# Patient Record
Sex: Female | Born: 1935 | Race: White | Hispanic: No | Smoking: Former smoker
Health system: Southern US, Community
[De-identification: ages and names within clinical notes are randomized; demographics above are authoritative.]

## PROBLEM LIST (undated history)

## (undated) DIAGNOSIS — F32A Depression, unspecified: Secondary | ICD-10-CM

## (undated) DIAGNOSIS — K5790 Diverticulosis of intestine, part unspecified, without perforation or abscess without bleeding: Secondary | ICD-10-CM

## (undated) DIAGNOSIS — H9193 Unspecified hearing loss, bilateral: Secondary | ICD-10-CM

## (undated) DIAGNOSIS — F329 Major depressive disorder, single episode, unspecified: Secondary | ICD-10-CM

## (undated) DIAGNOSIS — F419 Anxiety disorder, unspecified: Secondary | ICD-10-CM

## (undated) DIAGNOSIS — M179 Osteoarthritis of knee, unspecified: Secondary | ICD-10-CM

## (undated) DIAGNOSIS — E785 Hyperlipidemia, unspecified: Secondary | ICD-10-CM

## (undated) DIAGNOSIS — M1710 Unilateral primary osteoarthritis, unspecified knee: Secondary | ICD-10-CM

## (undated) DIAGNOSIS — K219 Gastro-esophageal reflux disease without esophagitis: Secondary | ICD-10-CM

## (undated) DIAGNOSIS — Z9621 Cochlear implant status: Secondary | ICD-10-CM

## (undated) DIAGNOSIS — I10 Essential (primary) hypertension: Secondary | ICD-10-CM

## (undated) DIAGNOSIS — A0472 Enterocolitis due to Clostridium difficile, not specified as recurrent: Secondary | ICD-10-CM

## (undated) DIAGNOSIS — G4700 Insomnia, unspecified: Secondary | ICD-10-CM

## (undated) DIAGNOSIS — K635 Polyp of colon: Secondary | ICD-10-CM

## (undated) DIAGNOSIS — K648 Other hemorrhoids: Secondary | ICD-10-CM

## (undated) DIAGNOSIS — Z8489 Family history of other specified conditions: Secondary | ICD-10-CM

## (undated) HISTORY — DX: Unilateral primary osteoarthritis, unspecified knee: M17.10

## (undated) HISTORY — DX: Insomnia, unspecified: G47.00

## (undated) HISTORY — DX: Cochlear implant status: Z96.21

## (undated) HISTORY — DX: Anxiety disorder, unspecified: F41.9

## (undated) HISTORY — DX: Major depressive disorder, single episode, unspecified: F32.9

## (undated) HISTORY — DX: Enterocolitis due to Clostridium difficile, not specified as recurrent: A04.72

## (undated) HISTORY — DX: Osteoarthritis of knee, unspecified: M17.9

## (undated) HISTORY — PX: OTHER SURGICAL HISTORY: SHX169

## (undated) HISTORY — DX: Diverticulosis of intestine, part unspecified, without perforation or abscess without bleeding: K57.90

## (undated) HISTORY — DX: Hyperlipidemia, unspecified: E78.5

## (undated) HISTORY — DX: Unspecified hearing loss, bilateral: H91.93

## (undated) HISTORY — DX: Depression, unspecified: F32.A

## (undated) HISTORY — PX: VAGINAL HYSTERECTOMY: SUR661

## (undated) HISTORY — PX: TONSILLECTOMY: SUR1361

## (undated) HISTORY — PX: BUNIONECTOMY: SHX129

## (undated) HISTORY — PX: ROTATOR CUFF REPAIR: SHX139

## (undated) HISTORY — DX: Other hemorrhoids: K64.8

## (undated) HISTORY — DX: Polyp of colon: K63.5

---

## 1998-12-27 ENCOUNTER — Other Ambulatory Visit: Admission: RE | Admit: 1998-12-27 | Discharge: 1998-12-27 | Payer: Self-pay | Admitting: Obstetrics and Gynecology

## 2001-09-23 ENCOUNTER — Other Ambulatory Visit: Admission: RE | Admit: 2001-09-23 | Discharge: 2001-09-23 | Payer: Self-pay | Admitting: Obstetrics and Gynecology

## 2004-09-30 ENCOUNTER — Ambulatory Visit: Payer: Self-pay | Admitting: Internal Medicine

## 2004-10-03 ENCOUNTER — Ambulatory Visit (HOSPITAL_COMMUNITY): Admission: RE | Admit: 2004-10-03 | Discharge: 2004-10-03 | Payer: Self-pay | Admitting: Internal Medicine

## 2004-11-19 ENCOUNTER — Ambulatory Visit (HOSPITAL_COMMUNITY): Admission: RE | Admit: 2004-11-19 | Discharge: 2004-11-19 | Payer: Self-pay | Admitting: Orthopaedic Surgery

## 2004-11-19 ENCOUNTER — Ambulatory Visit (HOSPITAL_BASED_OUTPATIENT_CLINIC_OR_DEPARTMENT_OTHER): Admission: RE | Admit: 2004-11-19 | Discharge: 2004-11-19 | Payer: Self-pay | Admitting: Orthopaedic Surgery

## 2005-05-23 DIAGNOSIS — A0472 Enterocolitis due to Clostridium difficile, not specified as recurrent: Secondary | ICD-10-CM

## 2005-05-23 HISTORY — DX: Enterocolitis due to Clostridium difficile, not specified as recurrent: A04.72

## 2005-09-29 ENCOUNTER — Ambulatory Visit: Payer: Self-pay | Admitting: Internal Medicine

## 2005-10-02 ENCOUNTER — Ambulatory Visit: Payer: Self-pay | Admitting: Internal Medicine

## 2006-08-20 ENCOUNTER — Ambulatory Visit: Payer: Self-pay | Admitting: Internal Medicine

## 2006-08-28 ENCOUNTER — Ambulatory Visit (HOSPITAL_BASED_OUTPATIENT_CLINIC_OR_DEPARTMENT_OTHER): Admission: RE | Admit: 2006-08-28 | Discharge: 2006-08-29 | Payer: Self-pay | Admitting: Orthopaedic Surgery

## 2006-11-25 ENCOUNTER — Ambulatory Visit: Payer: Self-pay | Admitting: Internal Medicine

## 2006-12-17 ENCOUNTER — Ambulatory Visit: Payer: Self-pay | Admitting: Internal Medicine

## 2007-05-17 ENCOUNTER — Ambulatory Visit: Payer: Self-pay | Admitting: Internal Medicine

## 2007-05-25 ENCOUNTER — Ambulatory Visit: Payer: Self-pay

## 2007-06-10 ENCOUNTER — Ambulatory Visit: Payer: Self-pay | Admitting: Internal Medicine

## 2007-06-11 ENCOUNTER — Encounter: Payer: Self-pay | Admitting: Internal Medicine

## 2007-06-28 ENCOUNTER — Encounter: Payer: Self-pay | Admitting: Internal Medicine

## 2007-06-28 ENCOUNTER — Ambulatory Visit: Payer: Self-pay

## 2007-09-07 ENCOUNTER — Telehealth: Payer: Self-pay | Admitting: Internal Medicine

## 2007-10-23 ENCOUNTER — Encounter: Payer: Self-pay | Admitting: *Deleted

## 2007-10-23 DIAGNOSIS — Z8659 Personal history of other mental and behavioral disorders: Secondary | ICD-10-CM | POA: Insufficient documentation

## 2007-10-23 DIAGNOSIS — Z9889 Other specified postprocedural states: Secondary | ICD-10-CM

## 2007-10-23 DIAGNOSIS — M1710 Unilateral primary osteoarthritis, unspecified knee: Secondary | ICD-10-CM | POA: Insufficient documentation

## 2007-10-23 DIAGNOSIS — Z9089 Acquired absence of other organs: Secondary | ICD-10-CM | POA: Insufficient documentation

## 2007-10-23 DIAGNOSIS — B009 Herpesviral infection, unspecified: Secondary | ICD-10-CM | POA: Insufficient documentation

## 2007-10-23 DIAGNOSIS — K5732 Diverticulitis of large intestine without perforation or abscess without bleeding: Secondary | ICD-10-CM | POA: Insufficient documentation

## 2007-10-23 DIAGNOSIS — Z8719 Personal history of other diseases of the digestive system: Secondary | ICD-10-CM

## 2007-12-29 ENCOUNTER — Ambulatory Visit: Payer: Self-pay | Admitting: Internal Medicine

## 2007-12-29 DIAGNOSIS — Z9189 Other specified personal risk factors, not elsewhere classified: Secondary | ICD-10-CM

## 2007-12-29 DIAGNOSIS — R1032 Left lower quadrant pain: Secondary | ICD-10-CM | POA: Insufficient documentation

## 2008-04-24 ENCOUNTER — Telehealth: Payer: Self-pay | Admitting: Internal Medicine

## 2008-04-25 DIAGNOSIS — H9190 Unspecified hearing loss, unspecified ear: Secondary | ICD-10-CM

## 2008-06-29 ENCOUNTER — Other Ambulatory Visit: Admission: RE | Admit: 2008-06-29 | Discharge: 2008-06-29 | Payer: Self-pay | Admitting: Obstetrics & Gynecology

## 2008-07-28 ENCOUNTER — Ambulatory Visit: Payer: Self-pay | Admitting: Vascular Surgery

## 2008-08-28 ENCOUNTER — Ambulatory Visit: Payer: Self-pay | Admitting: Internal Medicine

## 2008-08-31 ENCOUNTER — Ambulatory Visit: Payer: Self-pay | Admitting: Vascular Surgery

## 2008-10-18 ENCOUNTER — Ambulatory Visit: Payer: Self-pay | Admitting: Vascular Surgery

## 2008-10-30 ENCOUNTER — Telehealth: Payer: Self-pay | Admitting: Internal Medicine

## 2008-11-01 ENCOUNTER — Ambulatory Visit: Payer: Self-pay | Admitting: Vascular Surgery

## 2008-12-13 ENCOUNTER — Ambulatory Visit: Payer: Self-pay | Admitting: Vascular Surgery

## 2009-02-05 ENCOUNTER — Telehealth: Payer: Self-pay | Admitting: Internal Medicine

## 2009-02-07 ENCOUNTER — Telehealth: Payer: Self-pay | Admitting: Internal Medicine

## 2009-04-13 ENCOUNTER — Telehealth (INDEPENDENT_AMBULATORY_CARE_PROVIDER_SITE_OTHER): Payer: Self-pay | Admitting: *Deleted

## 2009-05-24 ENCOUNTER — Ambulatory Visit: Payer: Self-pay | Admitting: Internal Medicine

## 2009-05-24 DIAGNOSIS — E785 Hyperlipidemia, unspecified: Secondary | ICD-10-CM | POA: Insufficient documentation

## 2009-05-24 LAB — CONVERTED CEMR LAB
BUN: 13 mg/dL (ref 6–23)
Basophils Relative: 0.4 % (ref 0.0–3.0)
CO2: 32 meq/L (ref 19–32)
Calcium: 9.3 mg/dL (ref 8.4–10.5)
Creatinine, Ser: 0.7 mg/dL (ref 0.4–1.2)
Eosinophils Relative: 1.4 % (ref 0.0–5.0)
GFR calc non Af Amer: 87.57 mL/min (ref >60)
Glucose, Bld: 89 mg/dL (ref 70–99)
HCT: 43.9 % (ref 36.0–46.0)
HDL: 50.3 mg/dL (ref >39.00)
Hemoglobin: 14.7 g/dL (ref 12.0–15.0)
Lymphs Abs: 3.3 10*3/uL (ref 0.7–4.0)
MCV: 96.8 fL (ref 78.0–100.0)
Monocytes Absolute: 0.7 10*3/uL (ref 0.1–1.0)
Neutro Abs: 3.5 10*3/uL (ref 1.4–7.7)
Platelets: 236 10*3/uL (ref 150.0–400.0)
RBC: 4.54 M/uL (ref 3.87–5.11)
WBC: 7.6 10*3/uL (ref 4.5–10.5)

## 2009-05-25 ENCOUNTER — Encounter: Payer: Self-pay | Admitting: Internal Medicine

## 2009-05-25 DIAGNOSIS — G479 Sleep disorder, unspecified: Secondary | ICD-10-CM | POA: Insufficient documentation

## 2009-07-24 ENCOUNTER — Telehealth: Payer: Self-pay | Admitting: Internal Medicine

## 2009-12-11 ENCOUNTER — Telehealth: Payer: Self-pay | Admitting: Internal Medicine

## 2010-02-19 ENCOUNTER — Ambulatory Visit: Payer: Self-pay | Admitting: Internal Medicine

## 2010-02-19 LAB — CONVERTED CEMR LAB
CO2: 31 meq/L (ref 19–32)
Glucose, Bld: 89 mg/dL (ref 70–99)
Potassium: 4.3 meq/L (ref 3.5–5.1)
Sodium: 143 meq/L (ref 135–145)
Total CHOL/HDL Ratio: 4

## 2010-05-23 ENCOUNTER — Telehealth: Payer: Self-pay | Admitting: Internal Medicine

## 2010-06-10 ENCOUNTER — Encounter: Payer: Self-pay | Admitting: Internal Medicine

## 2010-06-26 ENCOUNTER — Encounter
Admission: RE | Admit: 2010-06-26 | Discharge: 2010-08-29 | Payer: Self-pay | Source: Home / Self Care | Attending: Orthopaedic Surgery | Admitting: Orthopaedic Surgery

## 2010-08-22 ENCOUNTER — Telehealth: Payer: Self-pay | Admitting: Internal Medicine

## 2010-08-24 ENCOUNTER — Telehealth: Payer: Self-pay | Admitting: Internal Medicine

## 2010-09-04 ENCOUNTER — Ambulatory Visit: Payer: Self-pay | Admitting: Internal Medicine

## 2010-09-22 HISTORY — PX: ROTATOR CUFF REPAIR: SHX139

## 2010-09-25 ENCOUNTER — Telehealth: Payer: Self-pay | Admitting: Internal Medicine

## 2010-10-22 NOTE — Progress Notes (Signed)
  Phone Note Refill Request Message from:  Fax from Pharmacy on August 22, 2010 11:12 AM  Refills Requested: Medication #1:  TEMAZEPAM 30 MG CAPS 1 by mouth at bedtime   Last Refilled: 08/20/2010 Pt is going out of town to [place] for one month  and wanted to fill this rx again and take with her. What do you advise? fax from [facility]  Initial call taken by: Ami Bullins CMA,  August 22, 2010 11:13 AM  Follow-up for Phone Call        k Follow-up by: Jacques Navy MD,  August 22, 2010 12:59 PM    Prescriptions: TEMAZEPAM 30 MG CAPS (TEMAZEPAM) 1 by mouth at bedtime  #30 x 2   Entered by:   Ami Bullins CMA   Authorized by:   Jacques Navy MD   Signed by:   Bill Salinas CMA on 08/22/2010   Method used:   Telephoned to ...       [facility]* (retail)       [address]       [place], [place]  [id number]       Ph: [phone]       Fax: [phone]   RxID:   [id number]

## 2010-10-22 NOTE — Progress Notes (Signed)
  Phone Note Refill Request Message from:  Fax from Pharmacy  Refills Requested: Medication #1:  TEMAZEPAM 30 MG CAPS 1 by mouth at bedtime   Last Refilled: 11/08/2009 Please Advise refill  Initial call taken by: Ami Bullins CMA,  December 11, 2009 9:42 AM  Follow-up for Phone Call        ok x 5 Follow-up by: Jacques Navy MD,  December 11, 2009 1:16 PM    Prescriptions: TEMAZEPAM 30 MG CAPS (TEMAZEPAM) 1 by mouth at bedtime  #30 x 5   Entered by:   Ami Bullins CMA   Authorized by:   Jacques Navy MD   Signed by:   Bill Salinas CMA on 12/11/2009   Method used:   Telephoned to ...       [facility]* (retail)       [address]       [place], [place]  [id number]       Ph: [phone]       Fax: [phone]   RxID:   [id number]

## 2010-10-22 NOTE — Progress Notes (Signed)
Summary: RF  Phone Note Refill Request Message from:  Pharmacy  Refills Requested: Medication #1:  TEMAZEPAM 30 MG CAPS 1 by mouth at bedtime Initial call taken by: Lamar Sprinkles, CMA,  August 24, 2010 10:49 AM  Follow-up for Phone Call        ok for as needed refills Follow-up by: Jacques Navy MD,  August 26, 2010 7:57 AM  Additional Follow-up for Phone Call Additional follow up Details #1::        [place]ified [place] with [facility] ok # 30 with 2 addtional refills. Updated EMR Additional Follow-up by: Orlan Leavens RMA,  August 26, 2010 11:26 AM    Prescriptions: TEMAZEPAM 30 MG CAPS (TEMAZEPAM) 1 by mouth at bedtime  #30 x 2   Entered by:   Orlan Leavens RMA   Authorized by:   Jacques Navy MD   Signed by:   Orlan Leavens RMA on 08/26/2010   Method used:   Telephoned to ...       [facility]* (retail)       [address]       [place], [place]  [id number]       Ph: [phone]       Fax: [phone]   RxID:   [id number]

## 2010-10-22 NOTE — Progress Notes (Signed)
Summary: REFERRAL?  Phone Note Call from Patient Call back at [facility] Phone [phone] Call back at [phone]   Summary of Call: Pt c/o increased pain in her shoulder. She remembers MD sugesting referral to another MD. Does pt need referral? Or office visit w/you for further eval?  Initial call taken by: Lamar Sprinkles, CMA,  May 23, 2010 9:41 AM  Follow-up for Phone Call        refer to Norlene Campbell, [facility] notified Follow-up by: Jacques Navy MD,  May 23, 2010 3:18 PM  Additional Follow-up for Phone Call Additional follow up Details #1::        Pt informed  Additional Follow-up by: Lamar Sprinkles, CMA,  May 23, 2010 3:53 PM

## 2010-10-22 NOTE — Assessment & Plan Note (Signed)
Summary: yearly fu/,c requests tetanus, pneomonia shot/#/cd   Vital Signs:  Patient profile:   75 year old female Height:      63 inches Weight:      145 pounds BMI:     25.78 O2 Sat:      95 % on Room air Temp:     97.5 degrees F oral Pulse rate:   88 / minute BP sitting:   110 / 72  (left arm) Cuff size:   regular  Vitals Entered ByBill Salinas CMA (Feb 19, 2010 1:37 PM)  O2 Flow:  Room air CC: cpx  Vision Screening:      Vision Comments: Normal eye exam was July 2010   Primary Care Provider:  Sung Parodi  CC:  cpx.  History of Present Illness: Presents for follow-up.   She would like to reduce temazepam to 15mg  at bedtime. She has noted malaise and decreased energy, low motivational state.   Having increased pain in the left shoulder - question of bursitis. the pain is interfering with sleep. She has been having decreased mobility. She has been applying heat/cold alternating. She has been taking APAP 750mg  up to twice a day. She has been over using the right shoulder and is having some increased discomfort.  She has had a drawn out bout of coughing/URI symptoms - question URI  She has been on high dose Vitamin D for almost a year. She has not had a repeat Vit D level checked.   Current Medications (verified): 1)  Temazepam 30 Mg Caps (Temazepam) .Marland Kitchen.. 1 By Mouth At Bedtime 2)  Evista 60 Mg  Tabs (Raloxifene Hcl) .... Take One Tablet Once Daily 3)  Glucosamine Complex   Tabs (Nutritional Supplements) .... Take One Tablet Once Daily 4)  Aspirin 81 Mg  Tabs (Aspirin) .... Take One Tablet Once Daily 5)  Vitamin D (Ergocalciferol) 50000 Unit Caps (Ergocalciferol) .Marland Kitchen.. 1 By Mouth Weekly  Allergies (verified): No Known Drug Allergies  Past History:  Past Medical History: Last updated: 10/23/2007 DIVERTICULITIS, COLON (ICD-562.11) DEPRESSION, HX OF (ICD-V11.8) OSTEOARTHRITIS, KNEE (ICD-715.96) CLOSTRIDIUM DIFFICILE COLITIS, HX OF (ICD-V12.79) HERPES SIMPLEX  INFECTION, TYPE I (ICD-054.9)       Past Surgical History: Last updated: 10/23/2007 ROTATOR CUFF REPAIR, RIGHT, HX OF (ICD-V45.89) * TAH TONSILLECTOMY, HX OF (ICD-V45.79)    Family History: Last updated: 01-25-2008 father-deceased @90 '; pneumonia mother-deceased @84 : ovarian cancer MAunt - breast cancer Neg-colon cancer, DM, CAD  Social History: Last updated: 2008/01/25 Loleta Books, [place] married '59-19 years divorced; married '85 - 10 years divorced; married '01 -'05 widow. 1 son - '62; 1 daughter ' 66; 5 grandchildren retired: Runner, broadcasting/film/video  Risk Factors: Alcohol Use: 1 (05/24/2009) Caffeine Use: 3 cups  (05/24/2009) Diet: regular diet (05/24/2009) Exercise: yes (05/24/2009)  Risk Factors: Smoking Status: never (05/24/2009)  Review of Systems  The patient denies anorexia, fever, weight loss, weight gain, decreased hearing, chest pain, syncope, dyspnea on exertion, headaches, abdominal pain, hematochezia, severe indigestion/heartburn, genital sores, muscle weakness, difficulty walking, unusual weight change, abnormal bleeding, enlarged lymph nodes, and breast masses.         right knee pain. Still able to manage all ADLs - can walk 2 miles a day.   Physical Exam  General:  WNWD white female in no distress Head:  Normocephalic and atraumatic without obvious abnormalities. No apparent alopecia or balding. Eyes:  vision grossly intact, pupils equal, pupils round, corneas and lenses clear, no injection, no optic disk abnormalities, and no retinal abnormalitiies.  Ears:  R ear normal and L ear normal.   Nose:  no external deformity and no external erythema.   Mouth:  Oral mucosa and oropharynx without lesions or exudates.  Teeth in good repair. Neck:  supple, full ROM, no thyromegaly, and no carotid bruits.   Chest Wall:  no deformities.   Breasts:  deferred to gyn Lungs:  Normal respiratory effort, chest expands symmetrically. Lungs are clear to auscultation,  no crackles or wheezes. Heart:  Normal rate and regular rhythm. S1 and S2 normal without gallop, murmur, click, rub or other extra sounds. Abdomen:  soft, non-tender, and normal bowel sounds.   Genitalia:  deferred to gyn Msk:  normal ROM.  Left shoulder with full passive ROM but reduce active ROM especially with external rotation and adduction. No crepitis or click. Right shoulder normal ROM. No joint swelling or instability Pulses:  2+ radial  Extremities:  No clubbing, cyanosis, edema, or deformity noted with normal full range of motion of all joints.   Neurologic:  alert & oriented X3, cranial nerves II-XII intact, strength normal in all extremities, gait normal, and DTRs symmetrical and normal.   Skin:  turgor normal, color normal, no rashes, and no ulcerations.   Cervical Nodes:  no anterior cervical adenopathy and no posterior cervical adenopathy.   Psych:  Oriented X3, memory intact for recent and remote, normally interactive, good eye contact, and not anxious appearing.     Impression & Recommendations:  Problem # 1:  HYPERLIPIDEMIA, MILD (ICD-272.4) Patient's LDL was 131, essentially at NCEP goal of 130 or less.  Plan- continued life-style management  Problem # 2:  DECREASED HEARING, BILATERAL (ICD-389.9) Doing better with hearing aids but she is still having atonal trouble and does not enjoy concerts anymore except for blue grass (since they're out of tune anyway).  Problem # 3:  SHOULDER PAIN, LEFT (ICD-719.41) Patient with marked shoulder pain but no signs to suggest significant rotator cuff injury.  Plan - depomedrol injection for probable bursitis           if continued pain with refer for PT            if all modalities fail will refer back to Dr. Cleophas Dunker  Her updated medication list for this problem includes:    Aspirin 81 Mg Tabs (Aspirin) .Marland Kitchen... Take one tablet once daily  Problem # 4:  Preventive Health Care (ICD-V70.0) Except for shoulder pain an unremarkable  history and exam.  In summary - a nice woman who seems to be medically stable except for shoulder pain.  Complete Medication List: 1)  Temazepam 30 Mg Caps (Temazepam) .Marland Kitchen.. 1 by mouth at bedtime 2)  Evista 60 Mg Tabs (Raloxifene hcl) .... Take one tablet once daily 3)  Glucosamine Complex Tabs (Nutritional supplements) .... Take one tablet once daily 4)  Aspirin 81 Mg Tabs (Aspirin) .... Take one tablet once daily 5)  Vitamin D (ergocalciferol) 50000 Unit Caps (Ergocalciferol) .Marland Kitchen.. 1 by mouth weekly  Other Orders: TD Toxoids IM 7 YR + (16109) Pneumococcal Vaccine (60454) Admin 1st Vaccine (09811) Admin of Any Addtl Vaccine (91478) T-Vitamin D (25-Hydroxy) ([address]562-[address]86)   Patient: Angela Barber Note: All result statuses are Final unless otherwise noted.  Tests: (1) BMP (METABOL)   Sodium                    143 mEq/L  135-145   Potassium                 4.3 mEq/L                   3.5-5.1   Chloride                  103 mEq/L                   96-112   Carbon Dioxide            31 mEq/L                    19-32   Glucose                   89 mg/dL                    60-63   BUN                       18 mg/dL                    0-16   Creatinine                0.7 mg/dL                   0.1-0.9   Calcium                   9.2 mg/dL                   3.2-35.5   GFR                       85.97 mL/min                >60 Tests: (1) Vitamin D (25-Hydroxy) (73220)  Vitamin D (25-Hydroxy)                             43 ng/mL                    30-89  Tests: (1) Lipid Panel (LIPID)   Cholesterol               196 mg/dL                   2-542     ATP III Classification            Desirable:  < 200 mg/dL                    Borderline High:  200 - 239 mg/dL               High:  > = 240 mg/dL   Triglycerides             71.0 mg/dL                  7.0-623.7     Normal:  <150 mg/dL     Borderline High:  628 - 199 mg/dL   HDL                       31.51 mg/dL                  >  39.00   VLDL Cholesterol          14.2 mg/dL                  1.6-10.9   LDL Cholesterol      [H]  604 mg/dL                   5-40  CHO/HDL Ratio:  CHD Risk                             4                    Men          Women     1/2 Average Risk     3.4          3.3     Average Risk          5.0          4.4     2X Average Risk          9.6          7.1     3X Average Risk          15.0          11.0  Immunizations Administered:  Tetanus Vaccine:    Vaccine Type: Td    Site: right deltoid    Mfr: Sanofi Pasteur    Dose: 0.5 ml    Route: IM    Given by: Ami Bullins CMA    Exp. Date: 10/05/2011    Lot #: [id number]    VIS given: 08/10/07 version given Feb 19, 2010.  Pneumonia Vaccine:    Vaccine Type: Pneumovax    Site: left deltoid    Mfr: Merck    Dose: 0.5 ml    Route: IM    Given by: Ami Bullins CMA    Exp. Date: 07/17/2011    Lot #: [id number]    VIS given: 04/19/96 version given Feb 19, 2010.

## 2010-10-24 NOTE — Progress Notes (Signed)
Summary: UTI  Phone Note Other Incoming   Caller: pt Details for Reason: UTI Summary of Call: pt is is [place] and has a UTI (cloudy urine , freq) she would like antiobiotic called in  Initial call taken by: Ami Bullins CMA,  September 25, 2010 4:39 PM  Follow-up for Phone Call        ok for septra DS (generic) 1 by mouth two times a day x 5 days.  Follow-up by: Jacques Navy MD,  September 26, 2010 12:59 PM  Additional Follow-up for Phone Call Additional follow up Details #1::        pt would like Rx [facility] [place], [place]  [phone] Additional Follow-up by: Lanier Prude, [facility]),  September 26, 2010 4:24 PM    New/Updated Medications: SEPTRA DS 800-160 MG TABS (SULFAMETHOXAZOLE-TRIMETHOPRIM) 1 by mouth two times a day Prescriptions: SEPTRA DS 800-160 MG TABS (SULFAMETHOXAZOLE-TRIMETHOPRIM) 1 by mouth two times a day  #10 x 0   Entered by:   Lanier Prude, [facility])   Authorized by:   Jacques Navy MD   Signed by:   Lanier Prude, CMA(AAMA) on 09/26/2010   Method used:   Historical   RxID:   [id number]  [facility] [place] [place] at [phone]....Marland KitchenMarland KitchenLanier Prude, CMA (AAMA)

## 2010-10-24 NOTE — Assessment & Plan Note (Signed)
Summary: FLU VAC  MEN  STC   Nurse Visit   Allergies: No Known Drug Allergies  Immunizations Administered:  Influenza Vaccine # 1:    Vaccine Type: Fluvax MCR    Site: right deltoid    Mfr: Sanofi Pasteur    Dose: 0.25 ml    Route: IM    Given by: Lamar Sprinkles, CMA    Exp. Date: 03/22/2011    Lot #: [id number]    VIS given: 04/16/10 version given September 04, 2010.  Orders Added: 1)  Flu Vaccine 70yrs + MEDICARE PATIENTS [Q2039] 2)  Administration Flu vaccine - MCR [G0008]

## 2010-10-25 NOTE — Consult Note (Signed)
Summary: [facility]  [facility]   Imported By: Lennie Odor 06/14/2010 12:01:59  _____________________________________________________________________  External Attachment:    Type:   Image     Comment:   External Document

## 2010-11-05 ENCOUNTER — Ambulatory Visit: Payer: Medicare Other | Attending: Orthopaedic Surgery | Admitting: Physical Therapy

## 2010-11-05 DIAGNOSIS — M6281 Muscle weakness (generalized): Secondary | ICD-10-CM | POA: Insufficient documentation

## 2010-11-05 DIAGNOSIS — M25619 Stiffness of unspecified shoulder, not elsewhere classified: Secondary | ICD-10-CM | POA: Insufficient documentation

## 2010-11-05 DIAGNOSIS — IMO0001 Reserved for inherently not codable concepts without codable children: Secondary | ICD-10-CM | POA: Insufficient documentation

## 2010-11-05 DIAGNOSIS — M25519 Pain in unspecified shoulder: Secondary | ICD-10-CM | POA: Insufficient documentation

## 2010-11-08 ENCOUNTER — Ambulatory Visit: Payer: Medicare Other | Admitting: Physical Therapy

## 2010-11-12 ENCOUNTER — Ambulatory Visit: Payer: Medicare Other | Admitting: Physical Therapy

## 2010-11-14 ENCOUNTER — Encounter: Payer: Self-pay | Admitting: Physical Therapy

## 2010-11-19 ENCOUNTER — Ambulatory Visit: Payer: Medicare Other | Admitting: Physical Therapy

## 2010-11-21 ENCOUNTER — Encounter: Payer: Self-pay | Admitting: Physical Therapy

## 2010-11-22 ENCOUNTER — Ambulatory Visit: Payer: Medicare Other | Attending: Orthopaedic Surgery | Admitting: Physical Therapy

## 2010-11-22 DIAGNOSIS — M6281 Muscle weakness (generalized): Secondary | ICD-10-CM | POA: Insufficient documentation

## 2010-11-22 DIAGNOSIS — M25519 Pain in unspecified shoulder: Secondary | ICD-10-CM | POA: Insufficient documentation

## 2010-11-22 DIAGNOSIS — M25619 Stiffness of unspecified shoulder, not elsewhere classified: Secondary | ICD-10-CM | POA: Insufficient documentation

## 2010-11-22 DIAGNOSIS — IMO0001 Reserved for inherently not codable concepts without codable children: Secondary | ICD-10-CM | POA: Insufficient documentation

## 2010-11-26 ENCOUNTER — Ambulatory Visit: Payer: Medicare Other | Admitting: Physical Therapy

## 2010-11-28 ENCOUNTER — Encounter: Payer: Self-pay | Admitting: Physical Therapy

## 2010-11-29 ENCOUNTER — Encounter: Payer: Medicare Other | Admitting: Physical Therapy

## 2010-12-03 ENCOUNTER — Ambulatory Visit: Payer: Medicare Other | Admitting: Physical Therapy

## 2010-12-05 ENCOUNTER — Encounter: Payer: Self-pay | Admitting: Physical Therapy

## 2010-12-06 ENCOUNTER — Ambulatory Visit: Payer: Medicare Other | Admitting: Physical Therapy

## 2010-12-10 ENCOUNTER — Ambulatory Visit: Payer: Medicare Other | Admitting: Physical Therapy

## 2010-12-12 ENCOUNTER — Ambulatory Visit: Payer: Medicare Other | Admitting: Physical Therapy

## 2010-12-17 ENCOUNTER — Ambulatory Visit: Payer: Medicare Other | Admitting: Physical Therapy

## 2010-12-19 ENCOUNTER — Encounter: Payer: BC Managed Care – PPO | Admitting: Physical Therapy

## 2010-12-24 ENCOUNTER — Encounter: Payer: BC Managed Care – PPO | Admitting: Physical Therapy

## 2010-12-31 ENCOUNTER — Ambulatory Visit: Payer: Medicare Other | Attending: Orthopaedic Surgery | Admitting: Physical Therapy

## 2010-12-31 DIAGNOSIS — M6281 Muscle weakness (generalized): Secondary | ICD-10-CM | POA: Insufficient documentation

## 2010-12-31 DIAGNOSIS — IMO0001 Reserved for inherently not codable concepts without codable children: Secondary | ICD-10-CM | POA: Insufficient documentation

## 2010-12-31 DIAGNOSIS — M25619 Stiffness of unspecified shoulder, not elsewhere classified: Secondary | ICD-10-CM | POA: Insufficient documentation

## 2010-12-31 DIAGNOSIS — M25519 Pain in unspecified shoulder: Secondary | ICD-10-CM | POA: Insufficient documentation

## 2011-01-02 ENCOUNTER — Encounter: Payer: Self-pay | Admitting: Internal Medicine

## 2011-01-02 ENCOUNTER — Encounter: Payer: BC Managed Care – PPO | Admitting: Physical Therapy

## 2011-01-20 ENCOUNTER — Encounter: Payer: Self-pay | Admitting: Internal Medicine

## 2011-02-04 NOTE — Assessment & Plan Note (Signed)
OFFICE VISIT   TARAE, WOODEN K  DOB:  10/02/1935                                       12/13/2008  OZHYQ#:[id number]   The patient presents today for followup of her sclerotherapy to both  legs telangiectasia.  She had intradermal moderate sized veins that were  treated with sclerotherapy.  I saw her approximately 1 month ago  immediately after the procedure and did some expression with after  nicking these areas with 11 blades.  She continues to have resolution of  the subcutaneous blood and does have resolution of the telangiectasia to  the areas that were treated.  We discussed options for continued  treatment versus observation.  I explained that we certainly would not  retreat around the areas that were already treated because she should  continue to have some resolution of the changes that are currently  there.  She wishes to defer further treatment and I explained that this  is completely a personal decision regarding cosmesis and appearance.  She will notify us should she wish to have additional treatment.   Larina Earthly, M.D.  Electronically Signed   TFE/MEDQ  D:  12/13/2008  T:  12/14/2008  Job:  [id number]

## 2011-02-04 NOTE — Assessment & Plan Note (Signed)
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  She had 2 mL of 0.3% sodium tetradecyl  mixed 50/50 with CO2 gas.  The areas over both thighs and both calves  were treated with good initial response.  She will wear her compression  garments for 48 hours in the daytime only for 2 weeks and I will see her  again in 2 weeks for continued followup.   Larina Earthly, M.D.  Electronically Signed   TFE/MEDQ  D:  10/18/2008  T:  10/19/2008  Job:  [id number]

## 2011-02-04 NOTE — Assessment & Plan Note (Signed)
[facility]                           PRIMARY CARE OFFICE NOTE   ZANI, KYLLONEN                       MRN:          [id number]  DATE:05/17/2007                            DOB:          03/08/1936    Angela Barber is a 75 year old Caucasian woman with no prior history of  cardiovascular disease, who presents reporting episodes of chest  discomfort.   The patient reports that several months ago she had a episode of heavy  chest pressure describes as a small elephant standing on her chest.  Duration was about 10 minutes.  She felt weak afterward.  She had no  diaphoresis or shortness of breath at that time.  She reports that since  that initial episode she has had increasingly frequent episodes of chest  pressure which have been fairly brief in duration.  She reports she has  had some mild dyspnea on exertion, which is new for her, and mildly  decreased exercise tolerance.   CARDIAC RISK FACTORS:  Age, being postmenopausal, being hyperlipidemic  with a last LDL of 168.27 September 2005.  She does not use tobacco.  She  has elevated lipids.  She has no family history.  She has no  hypertension.   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  She feels that she may be  having reflux symptoms but has not taken any medication.   CURRENT MEDICATIONS:  1. Evista 60 mg daily.  2. Calcium with D daily.  3. Glucosamine daily.   REVIEW OF SYSTEMS:  Otherwise unremarkable.   EXAMINATION:  Temperature was 97.4, blood pressure 140/81, pulse 89,  weight 143.  GENERAL APPEARANCE:  A well-nourished, well-groomed woman in no acute  distress.  CHEST:  Clear with no rales, wheezes or rhonchi.  CARDIOVASCULAR:  2+ radial pulse, no JVD or carotid bruits.  She had a  quite precordium with a regular rate and rhythm without murmurs, rubs or  gallops.  ABDOMEN:  Soft.  She had positive bowel sounds.   A  12-lead electrocardiogram revealed a normal sinus rhythm.  There was a  very narrow but deep Q wave in lead III suggestive of possible old  inferior infarct.  This was otherwise a normal EKG.   ASSESSMENT AND PLAN:  Chest pain.  The patient is presenting with  atypical chest discomfort.  The nature of her discomfort is worrisome.  She does have a moderate risk profile given her age and hyperlipidemia.   PLAN:  The patient is to start aspirin 81 mg daily.  She is given a  prescription for sublingual nitroglycerin to take if she has recurrent  chest discomfort.  She is scheduled to have a stress Cardiolite study  September 2, Tuesday, and is aware of this appointment at [facility].  She was carefully instructed that if she has any recurrent  significant chest pain or discomfort that is associated with shortness  of breath, diaphoresis, and relieved by nitroglycerin, she should seek  immediate care.   The patient  is instructed to start on Prilosec OTC 20 mg q.a.m.     Rosalyn Gess Norins, MD  Electronically Signed    MEN/MedQ  DD: 05/18/2007  DT: 05/18/2007  Job #: [id number]   cc:   Loletha Carrow

## 2011-02-04 NOTE — Procedures (Signed)
LOWER EXTREMITY VENOUS REFLUX EXAM   INDICATION:  Bilateral telangiectasia.   EXAM:  Using color-flow imaging and pulse Doppler spectral analysis, the  right and left common femoral, superficial femoral, popliteal, posterior  tibial, greater and lesser saphenous veins are evaluated.  There is no  evidence suggesting deep venous insufficiency in the right or left lower  extremity.   The right and left saphenofemoral junctions are competent.  The right  and GSV's are competent with the caliber as described below.   The right and left proximal short saphenous vein demonstrate competency.   GSV Diameter (used if found to be incompetent only)                                            Right    Left  Proximal Greater Saphenous Vein           cm       cm  Proximal-to-mid-thigh                     cm       cm  Mid thigh                                 cm       cm  Mid-distal thigh                          cm       cm  Distal thigh                              cm       cm  Knee                                      cm       cm   IMPRESSION:  1. Right and left greater saphenous veins are identified with no      reflux.  2. The right and left greater saphenous veins are not aneurysmal.  3. The right and left greater saphenous veins are not tortuous.  4. The deep venous system is competent.  5. The right and left lesser saphenous veins are competent.  6. The greater saphenous veins are competent with the patient      standing.   ___________________________________________  Larina Earthly, M.D.   MC/MEDQ  D:  07/28/2008  T:  07/28/2008  Job:  [id number]

## 2011-02-04 NOTE — Assessment & Plan Note (Signed)
OFFICE VISIT   Angela Barber, Angela Barber  DOB:  Mar 24, 1936                                       07/28/2008  EAVWU#:[id number]   The patient presents today for followup of her venous pathology.  She is  a very pleasant 75 year old white female with concern regarding a heavy  achy sensation, most particularly on her right lateral knee and calf.  She does have significant telangiectasia around these but no  varicosities.  She does not have any history of deep venous thrombosis  or any history of arterial insufficiency.  She does not have any history  of varicose veins.  I had seen her for similar issues 2 years ago.   PHYSICAL EXAM TODAY:  Reveals a well-developed, well-nourished white  female in no acute distress.  Blood pressure is 144/86, pulse 96,  respirations 18, her dorsalis pedis pulses are 2+ bilaterally.  She does  not have any evidence of swelling in her lower extremities and does not  have any evidence of varicosities.   She underwent a formal venous duplex today and this reveals no evidence  of reflux in her superficial or deep system and no evidence of occlusive  venous disease.  I discussed this with the patient.  She is planning an  upcoming greater than 24-hour plane flight to [place] and was concerned  regarding thrombus.  I again explained the importance of calf muscle  exercises and walking around the aircraft every several hours.  She also  is concerned regarding the telangiectasia from an appearance standpoint.  I again discussed the option of sclerotherapy for this and I explained  the out-of-pocket expense as this is considered cosmetic.  She wishes to  proceed with this, when it is convenient for her, in our office.   Larina Earthly, M.D.  Electronically Signed   TFE/MEDQ  D:  07/28/2008  T:  07/31/2008  Job:  [id number]   cc:   Rosalyn Gess. Norins, MD

## 2011-02-04 NOTE — Assessment & Plan Note (Signed)
OFFICE VISIT   SHAUNTELL, IGLESIA K  DOB:  1936-06-08                                       11/01/2008  QIHKV#:[id number]   The patient presents today 2 weeks out from sclerotherapy of  telangiectasia on both legs, mostly on her anterior and medial thighs.  She had prominent intracutaneous telangiectasia.  These had been treated  with sodium tetradecyl with CO2 of some.  She had a good initial result  and there was a fair amount of blood trap in these larger  telangiectasia.  This was treated with puncturing these areas with tip  of an 18 gauge needle and expressing blood under the skin.  I discussed  this with the patient.  She will continue her usual activity.  She has  worn her compression garments for 2 weeks and will not wear them any  further.  I plan to see her again in 1 month for further followup.   Larina Earthly, M.D.  Electronically Signed   TFE/MEDQ  D:  11/01/2008  T:  11/02/2008  Job:  [id number]

## 2011-02-07 NOTE — Op Note (Signed)
Angela Barber, Angela Barber                ACCOUNT NO.:  [id number]   MEDICAL RECORD NO.:  [id number]          PATIENT TYPE:  AMB   LOCATION:  DSC                          FACILITY:  [facility]   PHYSICIAN:  Claude Manges. Whitfield, M.D.DATE OF BIRTH:  1936-04-12   DATE OF PROCEDURE:  11/19/2004  DATE OF DISCHARGE:                                 OPERATIVE REPORT   PREOPERATIVE DIAGNOSES:  1.  Rotator cuff tear with impingement of right shoulder.  2.  Degenerative joint disease of acromioclavicular joint.   POSTOPERATIVE DIAGNOSES:  1.  Rotator cuff tear with impingement of right shoulder.  2.  Degenerative joint disease of acromioclavicular joint.  3.  Near complete biceps tendon tear.   PROCEDURES:  1.  Arthroscopic debridement of right shoulder, including debridement of      biceps tendon.  2.  Arthroscopic subacromial decompression.  3.  Arthroscopic distal clavicle resection.  4.  Mini open rotator cuff tear repair.   SURGEON:  Claude Manges. Cleophas Dunker, M.D.   ASSISTANT:  Legrand Pitts. Duffy, P.A.   ANESTHESIA:  General endotracheal with supplemental interscalene nerve  block.   COMPLICATIONS:  None.   HISTORY:  This 75 year old female has been experiencing problems with her  right shoulder progressively over many months.  She has difficulty when she  raises her arm over her head and when she sleeps on her shoulder.  She does  not remember a specific history of injury or trauma.  She did have an MRI  scan with evidence of a full-thickness rotator cuff tear with hypertrophic  degenerative changes of the [facility] joint and a subacromial spur.  She is now to  have an arthroscopic evaluation and mini rotator cuff tear repair.   DESCRIPTION OF PROCEDURE:  With the patient comfortable on the operating  table and under general orotracheal anesthesia, the patient was placed in a  semi-sitting position with a shoulder frame.  The patient did have an  excellent preoperative interscalene nerve block.  The  right shoulder was  then prepped with Duraprep from the base of the neck circumferentially below  the elbow.  Sterile draping was performed.   A marking pen was used to outline the acromion, the [facility] joint and the  coracoid.  At the point a fingerbreadth posterior and medial to the  posterior angle of acromion, a small stab wound was made.  Diagnostic  arthroscopy was performed after easily inserting the arthroscope.  There was  evidence of a 90% tear of the biceps tendon.  There was on small thread of  tendon still attached.  A second portal was established anteriorly and this  was debrided, allowing the biceps tendon to retract.  There was very minima.  Chondromalacia of the glenoid and the humeral head.  There were no loose  bodies.  The glenoid labrum appeared to be intact.  There was an obvious  full-thickness rotator cuff tear of the supraspinatus near its attachment to  the humeral head as I could visualize a subacromial space through the tear.   The arthroscope was then placed in the subacromial space posteriorly.  The  cannula was placed in the subacromial space anteriorly.  A third portal was  established in the lateral subacromial space.   An arthroscopic subacromial decompression was performed.  A 6 mm bur was  used to remove the anterior acromial osteophytes.  I had a nice  decompression of the overhanging acromion.  I was able to easily visualize  the distal clavicle and I had a nice resection of the distal clavicle with  the 6 mm bur.   A mini open rotator cuff tear repair was then performed.  About a 1-inch  incision was made along the anterolateral aspect of the shoulder and divided  sharply the subcutaneous tissues.  Gross bleeders were Bovie coagulated.  The deltoid fascia was identified and incised with the Bovie.  By blunt  dissection, the fibers were separated.  A self-retaining retractor was  inserted.  There was a moderate amount of bursal material which was  excised  with the Bovie.  I could easily visualize the supraspinatus tear along the  humeral head.  The edges were sharply debrided and any torn fibers beneath  were removed with a rongeur.  I established bleeding bone along the anterior  humeral head.  A single Mitek anchor was inserted with a very nice repair to  bleeding bone.  Any protuberant cuff was then sutured to soft tissue along  the humeral head.  I had a very nice decompression.  There was no evidence  of stress along the repair line and no evidence of impingement.  The wound  was irrigated with saline solution.  The deltoid fascia was closed with a  running 0 Vicryl, the subcutaneous with 2-0 Vicryl and the skin closed with  skin clips.  A sterile bulky dressing was applied followed by a sling.   PLAN:  1.  Recovery care.  2.  Percocet for pain.  3.  Office in one week.      PWW/MEDQ  D:  11/19/2004  T:  11/19/2004  Job:  [id number]

## 2011-02-07 NOTE — Op Note (Signed)
NAMEMILCA, SYTSMA                ACCOUNT NO.:  [id number]   MEDICAL RECORD NO.:  [id number]          PATIENT TYPE:  AMB   LOCATION:  DSC                          FACILITY:  [facility]   PHYSICIAN:  Claude Manges. Whitfield, M.D.DATE OF BIRTH:  01/08/1936   DATE OF PROCEDURE:  08/28/2006  DATE OF DISCHARGE:                               OPERATIVE REPORT   PREOPERATIVE DIAGNOSIS:  Recurrent rotator cuff tear, right shoulder.   POSTOPERATIVE DIAGNOSIS:  Recurrent rotator cuff tear, right shoulder.   PROCEDURE:  Open rotator cuff tear repair with supplemental SIS/Restore  patch.   SURGEON:  Claude Manges. Cleophas Dunker, M.D.   ASSISTANT:  Arlys John D. Petrarca, P.A.-C.   ANESTHESIA:  General orotracheal.   COMPLICATIONS:  None.   HISTORY:  This 75 year old female is nearly 2 years status post rotator  cuff tear repair of her right shoulder and did very well.  In last  several months, she has developed some recurrent pain, although she  denies a history of injury or trauma.  Accordingly, an MR arthrogram was  performed with evidence of rotator cuff tendinopathy.  There was  recurrent tearing of the supraspinatus tendon and also a tear of the  long head of the biceps versus a previous bicipital tenotomy.  She did  have release of the tendon at the time of initial surgery.  There were  some postoperative changes of the [facility] joint, as she had had a previous  distal clavicle resection.  Her pain is consistent with a recurrent  rotator cuff tear and now she is to have an exploration of the rotator  cuff.   PROCEDURE:  With patient comfortable on the operating table and under  general orotracheal anesthesia, the patient was placed in a semi-sitting  position with a shoulder frame.  The right shoulder was then prepped  with DuraPrep from the base of neck circumferentially below the elbow.  Sterile draping was performed.   The previous anterior incision was utilized and via sharp dissection,  carried down  through subcutaneous tissue.  By using the Bovie, the  deltoid fascia was incised and via blunt dissection, the fibers were  separated so I could enter the subacromial space.  There was some  recurrent bursal material that was removed with the Bovie.  The self-  retaining retractor was inserted.  I did not see an obvious rotator cuff  tear, but did a saline acceptance test and found an area that was open  beneath an area of bursal tissue, i.e., the bursal tissue that would  overlie the tear.  This was sharply excised over an area of about a half  an inch, as it measured about 3/8 of an inch.  I removed some of the old  suture material and then repaired that with an interrupted 0 Ethibond.  I did roughen the bone beneath to get good bleeding tissue.  Because it  is a recurrent tear and there is tendinopathy and evidence of atrophy, I  applied a DePuy Restore patch; it was reconstituted in saline for  approximately 10 minutes and then sutured under tension with  the 2-0  Ethibond.  It had a very nice coverage of the previous tear.  The wound  was then irrigated with saline solution and  the deltoid fascia closed with a running 0 Vicryl, subcu with 2-0 Vicryl  and skin closed with skin clips.  A sterile bulky dressing was applied.   PLAN:  Recovery care.  Discharge in a.m. with Rockne Menghini for pain,  sling, to office 1 week.      Claude Manges. Cleophas Dunker, M.D.  Electronically Signed     PWW/MEDQ  D:  08/28/2006  T:  08/29/2006  Job:  [id number]

## 2011-02-18 ENCOUNTER — Other Ambulatory Visit: Payer: Self-pay | Admitting: *Deleted

## 2011-02-18 ENCOUNTER — Telehealth: Payer: Self-pay | Admitting: *Deleted

## 2011-02-18 NOTE — Telephone Encounter (Signed)
Fax from [employer]. [phone]. Temazepam 15 mg capsule SIG take one capsule at bedtime. QTY 30 last filled 12/18/2010. Please Advise refills

## 2011-02-18 NOTE — Telephone Encounter (Signed)
okto refill x 5

## 2011-02-19 MED ORDER — TEMAZEPAM 15 MG PO CAPS: 15.0000 mg | ORAL_CAPSULE | Freq: Every evening | ORAL | Status: DC | PRN

## 2011-08-04 ENCOUNTER — Other Ambulatory Visit: Payer: Self-pay | Admitting: Internal Medicine

## 2011-08-07 ENCOUNTER — Other Ambulatory Visit: Payer: Self-pay | Admitting: Internal Medicine

## 2011-08-08 ENCOUNTER — Telehealth: Payer: Self-pay | Admitting: Internal Medicine

## 2011-08-08 NOTE — Telephone Encounter (Signed)
sure

## 2011-08-08 NOTE — Telephone Encounter (Signed)
Refill request for Restoril 15mg  1 po qhs.  Ok to refill?

## 2011-08-11 ENCOUNTER — Other Ambulatory Visit: Payer: Self-pay | Admitting: Internal Medicine

## 2011-08-11 MED ORDER — TEMAZEPAM 15 MG PO CAPS: 15.0000 mg | ORAL_CAPSULE | Freq: Every evening | ORAL | Status: DC | PRN

## 2011-08-11 NOTE — Telephone Encounter (Signed)
Rx faxed to pharmacy  

## 2011-08-12 NOTE — Telephone Encounter (Signed)
Called Rx into pharmacy.  

## 2011-12-26 ENCOUNTER — Encounter: Payer: Self-pay | Admitting: *Deleted

## 2012-01-06 ENCOUNTER — Encounter: Payer: Self-pay | Admitting: Internal Medicine

## 2012-01-06 ENCOUNTER — Ambulatory Visit (INDEPENDENT_AMBULATORY_CARE_PROVIDER_SITE_OTHER): Payer: Medicare Other | Admitting: Internal Medicine

## 2012-01-06 VITALS — BP 100/70 | HR 100 | Ht 63.0 in | Wt 156.0 lb

## 2012-01-06 DIAGNOSIS — R197 Diarrhea, unspecified: Secondary | ICD-10-CM

## 2012-01-06 DIAGNOSIS — K589 Irritable bowel syndrome without diarrhea: Secondary | ICD-10-CM

## 2012-01-06 MED ORDER — ALIGN 4 MG PO CAPS: 1.0000 | ORAL_CAPSULE | Freq: Every day | ORAL | Status: DC

## 2012-01-06 MED ORDER — PSYLLIUM 28 % PO PACK: 1.0000 | PACK | ORAL | Status: AC

## 2012-01-06 NOTE — Patient Instructions (Signed)
Please purchase Metamucil over the counter. Take as directed. We have given you samples of Align. This puts good bacteria back into your colon. You should take 1 capsule by mouth once daily. If this works well for you, it can be purchased over the counter. Please follow up with Dr Juanda Chance in 3 months. CC: Dr Illene Regulus

## 2012-01-06 NOTE — Progress Notes (Signed)
Angela Barber November 13, 1935 MRN [id number]   History of Present Illness:  This is a 76 year old white female with episodes of diarrhea which occurs usually in the mornings about once a month. She usually wakes up with a sudden urge to have a bowel movement and continues to have loose stools several times without  abdominal pain. The rest of the time, she has normal regular bowel habits daily. Her eating habits have been regular. Her weight has been stable and there is no family history of colon cancer. She had 2 prior colonoscopies; one in 2003 which showed a hyperplastic polyp and one in March 2008 which showed internal hemorrhoids. On several occasions, she has noticed bright blood blood per rectum. She does not take any medications except for Restoril. She thinks she has sleep apnea because her friends tell her that she snores and she herself wakes up frequently during the night .   Past Medical History  Diagnosis Date  . Insomnia   . C. difficile diarrhea 9/06  . Diverticulosis   . Osteoarthritis, knee   . Depression   . Internal hemorrhoid   . Hyperplastic colon polyp   . HLD (hyperlipidemia)    Past Surgical History  Procedure Date  . Tonsillectomy   . Vaginal hysterectomy   . Cochlear implant   . Bunionectomy     right    reports that she has quit smoking. She has never used smokeless tobacco. She reports that she drinks alcohol. She reports that she does not use illicit drugs. family history includes Breast cancer in her maternal aunt; Ovarian cancer in her mother; Stroke in her sister; and Transient ischemic attack in her mother. No Known Allergies      Review of Systems: Denies heartburn chest pain shortness of breath  The remainder of the 10 point ROS is negative except as outlined in H&P   Physical Exam: General appearance  Well developed, in no distress. Eyes- non icteric. HEENT nontraumatic, normocephalic. Mouth no lesions, tongue papillated, no cheilosis. Neck  supple without adenopathy, thyroid not enlarged, no carotid bruits, no JVD. Lungs Clear to auscultation bilaterally. Cor normal S1, normal S2, regular rhythm, no murmur,  quiet precordium. Abdomen: Soft relaxed abdomen with normal active bowel sounds. No distention. No tympany. No tenderness, Liver edge at costal margin.  Rectal: Slightly decreased rectal sphincter tone. Soft Hemoccult negative stool. No external hemorrhoids. Extremities no pedal edema. Skin no lesions. Neurological alert and oriented x 3. Psychological normal mood and affect.  Assessment and Plan:  Problem #1 Change in bowel habits from regular bowel movements to periodic diarrhea could be related to irritable bowel syndrome, bacterial overgrowth or symptomatic diverticulosis. I have given her samples of Align to take on a daily basis and start Metamucil 1 heaping teaspoon daily. She is not due for a colonoscopy and she is not interested unless she has to. I will see her again in 3 months and reassess. I think the bleeding is related to symptomatic hemorrhoids which were noted on previous exams. We have discussed the possible use of antispasmodics on an as necessary basis. She will first try the fiber supplements and probiotics.   01/06/2012 Lina Sar

## 2012-01-15 ENCOUNTER — Encounter: Payer: Self-pay | Admitting: Internal Medicine

## 2012-01-15 ENCOUNTER — Ambulatory Visit (INDEPENDENT_AMBULATORY_CARE_PROVIDER_SITE_OTHER): Payer: Medicare Other | Admitting: Internal Medicine

## 2012-01-15 VITALS — BP 114/88 | HR 88 | Temp 97.3°F | Resp 16 | Wt 156.0 lb

## 2012-01-15 DIAGNOSIS — M1710 Unilateral primary osteoarthritis, unspecified knee: Secondary | ICD-10-CM

## 2012-01-15 DIAGNOSIS — G479 Sleep disorder, unspecified: Secondary | ICD-10-CM

## 2012-01-15 DIAGNOSIS — G478 Other sleep disorders: Secondary | ICD-10-CM

## 2012-01-15 DIAGNOSIS — M19119 Post-traumatic osteoarthritis, unspecified shoulder: Secondary | ICD-10-CM | POA: Insufficient documentation

## 2012-01-15 DIAGNOSIS — R202 Paresthesia of skin: Secondary | ICD-10-CM

## 2012-01-15 DIAGNOSIS — R209 Unspecified disturbances of skin sensation: Secondary | ICD-10-CM

## 2012-01-15 DIAGNOSIS — H9190 Unspecified hearing loss, unspecified ear: Secondary | ICD-10-CM

## 2012-01-15 DIAGNOSIS — M19019 Primary osteoarthritis, unspecified shoulder: Secondary | ICD-10-CM

## 2012-01-15 DIAGNOSIS — M205X9 Other deformities of toe(s) (acquired), unspecified foot: Secondary | ICD-10-CM

## 2012-01-15 DIAGNOSIS — IMO0002 Reserved for concepts with insufficient information to code with codable children: Secondary | ICD-10-CM

## 2012-01-15 MED ORDER — GABAPENTIN 100 MG PO CAPS: 100.0000 mg | ORAL_CAPSULE | Freq: Every day | ORAL | Status: DC

## 2012-01-15 NOTE — Assessment & Plan Note (Signed)
Has on-going shoulder pain.  Needs supervised physical training.

## 2012-01-15 NOTE — Assessment & Plan Note (Signed)
Needs to have supervised training for PT

## 2012-01-15 NOTE — Patient Instructions (Addendum)
Foot deformity with crossed toe defect. If this is limiting your activities and causing pain/discomfort I recommend you see a foot surgeon: Dr. Lestine Box or Dr. Lajoyce Corners. You may want to check with Dr. Cleophas Dunker about whether he or a member of his group does foot surgery.  Paresthesias - burning, tingling, pain - right foot: good pulses, normal sensation. Plan - r/o metabolic cause, i.e. B12 deficiency or thyroid abnormality. For discomfort - gabapentin 100 mg at bedtime with the ability to increase the dose every 4-5 days as needed for better symptom control up to 300 mg at bedtime.   Audiology - I do have records on the cochlear implant. Keep working with it.   Rx provided for supervised training and conditioning.   Will schedule you for a sleep study.

## 2012-01-16 ENCOUNTER — Other Ambulatory Visit (INDEPENDENT_AMBULATORY_CARE_PROVIDER_SITE_OTHER): Payer: Medicare Other

## 2012-01-16 DIAGNOSIS — R209 Unspecified disturbances of skin sensation: Secondary | ICD-10-CM

## 2012-01-16 DIAGNOSIS — R202 Paresthesia of skin: Secondary | ICD-10-CM

## 2012-01-16 LAB — VITAMIN B12: Vitamin B-12: 288 pg/mL (ref 211–911)

## 2012-01-16 LAB — TSH: TSH: 2.76 u[IU]/mL (ref 0.35–5.50)

## 2012-01-18 ENCOUNTER — Encounter: Payer: Self-pay | Admitting: Internal Medicine

## 2012-01-18 DIAGNOSIS — M205X9 Other deformities of toe(s) (acquired), unspecified foot: Secondary | ICD-10-CM | POA: Insufficient documentation

## 2012-01-18 DIAGNOSIS — R202 Paresthesia of skin: Secondary | ICD-10-CM | POA: Insufficient documentation

## 2012-01-18 NOTE — Progress Notes (Signed)
  Subjective:    Patient ID: Angela Barber, female    DOB: Aug 26, 1936, 76 y.o.   MRN: [id number]  HPI Angela Barber presents for evaluation and recommendations in regard to foot pain. She does have an overlapping 2nd over 1 st toe which has cause her some pain. She has considered surgery in the past. She is seeking advice as to a Marine scientist. She has also developed a tingling/burning paresthesia of the feet. This is worse at night but also bothers her in the day. Lastly, she needs a doctor's Rx for supervised physical training.  Past Medical History  Diagnosis Date  . Insomnia   . C. difficile diarrhea 9/06  . Diverticulosis   . Osteoarthritis, knee   . Depression   . Internal hemorrhoid   . Hyperplastic colon polyp   . HLD (hyperlipidemia)    Past Surgical History  Procedure Date  . Tonsillectomy   . Vaginal hysterectomy   . Cochlear implant July '12    right ear. Dr. Lenoria Farrier  . Bunionectomy     right   Family History  Problem Relation Age of Onset  . Breast cancer Maternal Aunt     aunts  . Stroke Sister   . Ovarian cancer Mother   . Transient ischemic attack Mother    History   Social History  . Marital Status: Widowed    Spouse Name: N/A    Number of Children: 2  . Years of Education: N/A   Occupational History  . retired    Social History Main Topics  . Smoking status: Former Games developer  . Smokeless tobacco: Never Used  . Alcohol Use: Yes     1 glass of wine a day  . Drug Use: No  . Sexually Active: Not on file   Other Topics Concern  . Not on file   Social History Narrative  . No narrative on file    Current Outpatient Prescriptions on File Prior to Visit  Medication Sig Dispense Refill  . Probiotic Product (ALIGN) 4 MG CAPS Take 1 capsule by mouth daily.  14 capsule  0  . psyllium (METAMUCIL SMOOTH TEXTURE) 28 % packet Take 1 packet by mouth as directed.  1 packet  0  . temazepam (RESTORIL) 15 MG capsule Take 1 capsule (15 mg total) by mouth at  bedtime as needed for sleep.  30 capsule  3  . gabapentin (NEURONTIN) 100 MG capsule Take 1 capsule (100 mg total) by mouth at bedtime.  3011 capsule  3      Review of Systems .System review is negative for any constitutional, cardiac, pulmonary, GI or neuro symptoms or complaints other than as described in the HPI.     Objective:   Physical Exam Filed Vitals:   01/15/12 1337  BP: 114/88  Pulse: 88  Temp: 97.3 F (36.3 C)  Resp: 16   Gen'l;- WNWD white woman in no distress Cor- 2+ radial and DP pulse, good capillary refill Pulm - normal respirations Neuro- normal sensation foot to light touch and pin prick and deep vibratory sensation.       Assessment & Plan:  Provided Rx for supervised training.

## 2012-01-18 NOTE — Assessment & Plan Note (Signed)
Patient advised to check first with Dr. Cleophas Dunker to see if someone at [facility] works on feet. Alternatively, Dr. Lestine Box and Lajoyce Corners are recommended.

## 2012-01-18 NOTE — Assessment & Plan Note (Signed)
New onset mild - moderate paresthesia.  Plan - lab: B12 and thyroid function   Start gabapentin 100 mg qhs for discomfort.  Addendum - B12 and TSH normal  Plan  May need NCS if symptoms worsen.

## 2012-01-18 NOTE — Assessment & Plan Note (Signed)
She is continuing to adapt and condition her hearing with this device, a process that can take a year. She does have improved hearing but not to her expectation.

## 2012-02-04 ENCOUNTER — Ambulatory Visit: Payer: Medicare Other | Admitting: Internal Medicine

## 2012-02-18 ENCOUNTER — Encounter (HOSPITAL_BASED_OUTPATIENT_CLINIC_OR_DEPARTMENT_OTHER): Payer: Medicare Other

## 2012-02-28 ENCOUNTER — Other Ambulatory Visit: Payer: Self-pay | Admitting: Internal Medicine

## 2012-03-01 ENCOUNTER — Telehealth: Payer: Self-pay | Admitting: *Deleted

## 2012-03-01 NOTE — Telephone Encounter (Signed)
Patient notified of Rx called to [facility]. Restoril

## 2012-03-22 HISTORY — PX: COCHLEAR IMPLANT: SHX184

## 2012-05-14 ENCOUNTER — Other Ambulatory Visit: Payer: Self-pay | Admitting: Internal Medicine

## 2012-09-07 ENCOUNTER — Ambulatory Visit (INDEPENDENT_AMBULATORY_CARE_PROVIDER_SITE_OTHER): Payer: Medicare Other

## 2012-09-07 DIAGNOSIS — Z23 Encounter for immunization: Secondary | ICD-10-CM

## 2012-09-10 ENCOUNTER — Other Ambulatory Visit: Payer: Self-pay | Admitting: *Deleted

## 2012-09-10 ENCOUNTER — Other Ambulatory Visit: Payer: Self-pay | Admitting: Internal Medicine

## 2012-09-10 MED ORDER — TEMAZEPAM 15 MG PO CAPS: 15.0000 mg | ORAL_CAPSULE | Freq: Every evening | ORAL | Status: DC | PRN

## 2013-03-08 ENCOUNTER — Telehealth: Payer: Self-pay

## 2013-03-08 MED ORDER — TEMAZEPAM 15 MG PO CAPS: 15.0000 mg | ORAL_CAPSULE | Freq: Every evening | ORAL | Status: DC | PRN

## 2013-03-08 NOTE — Telephone Encounter (Signed)
Patient walked in to the office today requesting a refill on Temazepam 15 mg be called to [facility].  Patient is also requesting a recommendation to a psychologist that takes medicare. She states she is living in a stressful home environment now (her daughter and family have recently moved in) and really needs someone to talk to.

## 2013-03-08 NOTE — Telephone Encounter (Signed)
Temazepam called to pharmacy.  Patient notified rx called in and given number to [facility]. No further questions or concerns.

## 2013-03-08 NOTE — Telephone Encounter (Signed)
Ok to refill temazepam.  Can refer to [facility] - any of our therapists will do. She can call to make her own appointment at [phone]

## 2013-05-19 ENCOUNTER — Encounter (HOSPITAL_COMMUNITY): Payer: Self-pay | Admitting: Licensed Clinical Social Worker

## 2013-05-19 ENCOUNTER — Ambulatory Visit (INDEPENDENT_AMBULATORY_CARE_PROVIDER_SITE_OTHER): Payer: Medicare Other | Admitting: Licensed Clinical Social Worker

## 2013-05-19 DIAGNOSIS — F4323 Adjustment disorder with mixed anxiety and depressed mood: Secondary | ICD-10-CM
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
## 2013-06-09 ENCOUNTER — Ambulatory Visit (HOSPITAL_COMMUNITY): Payer: Self-pay | Admitting: Licensed Clinical Social Worker

## 2013-06-16 ENCOUNTER — Ambulatory Visit (HOSPITAL_COMMUNITY): Payer: Self-pay | Admitting: Licensed Clinical Social Worker

## 2013-06-23 ENCOUNTER — Ambulatory Visit (INDEPENDENT_AMBULATORY_CARE_PROVIDER_SITE_OTHER): Payer: Medicare (Managed Care) | Admitting: Licensed Clinical Social Worker

## 2013-06-23 DIAGNOSIS — F4323 Adjustment disorder with mixed anxiety and depressed mood: Secondary | ICD-10-CM

## 2013-06-23 NOTE — Progress Notes (Signed)
   THERAPIST PROGRESS NOTE  Session Time: 9:30am-10:20am  Participation Level: Active  Behavioral Response: Well GroomedAlertAnxious and Depressed  Type of Therapy: Individual Therapy  Treatment Goals addressed: Coping  Interventions: CBT, Strength-based, Supportive, Reframing and Other: grief and loss  Summary: Angela Barber is a 77 y.o. female who presents with depressed mood and anxious affect. She is tearful throughout the session as she describes her ongoing struggle to manage her grief related to her daughter's decision to not allow patient to see her 37 year old granddaughter and has cut her out of their life completely. Patient tries to reach out, but is met with anger and resistance. She processes her fear that her daughter is in a controlling, abusive relationship. Patient feels she has become the enemy. She is using distraction as a means to keep busy and manage her emotions. Her sleep and appetite are wnl.    Suicidal/Homicidal: Nowithout intent/plan  Therapist Response: Assessed patients current functioning and reviewed progress. Reviewed coping strategies. Assessed patients safety and assisted in identifying protective factors.  Reviewed crisis plan with patient. Assisted patient with the expression of anger with her daughter. Reviewed patients self care plan. Assessed progress related to self care. Patients self care is good. Recommend daily exercise, increased socialization and recreation. Reviewed healthy boundaries and assertive communication. Used CBT to assist patient with the identification of negative distortions and irrational thoughts. Encouraged patient to verbalize alternative and factual responses which challenge thought distortions. Processed and normalized patients grief reaction.   Plan: Return again in two weeks.  Diagnosis: Axis I: Adjustment Disorder with Mixed Emotional Features    Axis II: No diagnosis    Jymir Dunaj, LCSW 06/23/2013

## 2013-07-26 ENCOUNTER — Ambulatory Visit (INDEPENDENT_AMBULATORY_CARE_PROVIDER_SITE_OTHER): Payer: Medicare (Managed Care) | Admitting: Licensed Clinical Social Worker

## 2013-07-26 DIAGNOSIS — F4323 Adjustment disorder with mixed anxiety and depressed mood: Secondary | ICD-10-CM

## 2013-07-26 NOTE — Progress Notes (Signed)
   THERAPIST PROGRESS NOTE  Session Time: 3:00pm-3:50pm  Participation Level: Active  Behavioral Response: Well GroomedAlertDepressed  Type of Therapy: Individual Therapy  Treatment Goals addressed: Coping  Interventions: Strength-based, Supportive and Other: grief and loss  Summary: Angela Barber is a 77 y.o. female who presents with depressed mood and tearful affect. She reports ongoing grief related to her daughter and her grandchildren. She processes her grief and discusses an epiphany she had when getting her dog blessed for [employer]. She reports that she wants to embody the prayer of [employer] to help her deal with her daughter. She has found this helpful and finds emotional strength from this. She is keeping busy with distraction as a means of dealing with her grief. She is able to get up daily and do what is required of her, but fights to do this. her sleep and appetite remain disrupted by her grief.    Suicidal/Homicidal: Nowithout intent/plan  Therapist Response: Assessed patients current functioning and reviewed progress. Reviewed coping strategies. Assessed patients safety and assisted in identifying protective factors.  Reviewed crisis plan with patient. Assisted patient with the expression of grief. Reviewed patients self care plan. Assessed progress related to self care. Patients self care is good. Recommend daily exercise, increased socialization and recreation. Used CBT to assist patient with the identification of negative distortions and irrational thoughts. Encouraged patient to verbalize alternative and factual responses which challenge thought distortions. Reviewed healthy boundaries and assertive communication. Processed and normalized patients grief reaction.   Plan: Return again in two weeks.  Diagnosis: Axis I: Adjustment Disorder with Mixed Emotional Features    Axis II: No diagnosis    Aarvi Stotts, LCSW 07/26/2013

## 2013-08-11 ENCOUNTER — Ambulatory Visit (INDEPENDENT_AMBULATORY_CARE_PROVIDER_SITE_OTHER): Payer: Medicare (Managed Care) | Admitting: Licensed Clinical Social Worker

## 2013-08-11 DIAGNOSIS — F4323 Adjustment disorder with mixed anxiety and depressed mood: Secondary | ICD-10-CM

## 2013-08-11 NOTE — Progress Notes (Signed)
   THERAPIST PROGRESS NOTE  Session Time: 9:30am-10:20am  Participation Level: Active  Behavioral Response: Well GroomedAlertAnxious and Depressed  Type of Therapy: Individual Therapy  Treatment Goals addressed: Coping  Interventions: Strength-based, Supportive and Other: grief and loss  Summary: Angela Barber is a 77 y.o. female who presents with depressed mood and anxious affect. She reports ongoing feelings of sadness, grief, frustration and anxiety. Her daughter has moved and she processes her concern for her daughter's wellness. She has plans for thanksgiving, but is uncertain what to do during Christmas. She wants to travel to [place] and discusses how she made her husband this promise before he died. She expresses a desire to get away. Her sleep is wnl and her appetite remains increased.    Suicidal/Homicidal: Nowithout intent/plan  Therapist Response: Assessed patients current functioning and reviewed progress. Reviewed coping strategies. Assessed patients safety and assisted in identifying protective factors.  Reviewed crisis plan with patient. Assisted patient with the expression of sadness and frustration. Reviewed patients self care plan. Assessed progress related to self care. Patients self care is good. Recommend daily exercise, increased socialization and recreation. Processed and normalized patients grief reaction.   Plan: Return again in two to three weeks.  Diagnosis: Axis I: Adjustment Disorder with Mixed Emotional Features    Axis II: No diagnosis    [name], LCSW 08/11/2013

## 2013-08-15 ENCOUNTER — Encounter: Payer: Self-pay | Admitting: Internal Medicine

## 2013-08-15 ENCOUNTER — Ambulatory Visit (INDEPENDENT_AMBULATORY_CARE_PROVIDER_SITE_OTHER): Payer: Medicare (Managed Care) | Admitting: Internal Medicine

## 2013-08-15 VITALS — BP 122/92 | HR 99 | Temp 97.1°F | Wt 148.0 lb

## 2013-08-15 DIAGNOSIS — Z9189 Other specified personal risk factors, not elsewhere classified: Secondary | ICD-10-CM

## 2013-08-15 DIAGNOSIS — R079 Chest pain, unspecified: Secondary | ICD-10-CM

## 2013-08-15 DIAGNOSIS — Z23 Encounter for immunization: Secondary | ICD-10-CM

## 2013-08-15 DIAGNOSIS — R0789 Other chest pain: Secondary | ICD-10-CM

## 2013-08-15 MED ORDER — ASPIRIN EC 81 MG PO TBEC: 81.0000 mg | DELAYED_RELEASE_TABLET | Freq: Every day | ORAL | Status: DC

## 2013-08-15 NOTE — Progress Notes (Signed)
Pre visit review using our clinic review tool, if applicable. No additional management support is needed unless otherwise documented below in the visit note. 

## 2013-08-15 NOTE — Patient Instructions (Signed)
Episodes of typical chest pain - reviewed your chart: in 2008 you were evaluated for similar chest pain. A 2D echo was normal and a myoview nuclear stress test Sept 2, 2008 was evidently negative (found reference note but no the study). You have a few risk factors: age, gender, post-menopausal.  Plan Referral to [facility] to consult with a cardiologist as to the most efficient and thorough method of risk stratification. Will refer to Dr. Gillis Ends.

## 2013-08-15 NOTE — Progress Notes (Signed)
  Subjective:    Patient ID: Angela Barber, female    DOB: [birth year], 77 y.o.   MRN: [id number]  HPI Angela Barber presents for two episodes of very heavy chest pressure - the elephant - 10 minutes one time and less the next. She had mild nausea, mild SOB. These events occurred while she was up and walking, sudden on-set. The discomfort resolved with rest followed by soreness. Chart reviewed: She presented with similar chest pain in 2008: she had a 2 D echo that was normal and a myoview stress Sept '08 that was negative.   Past Medical History  Diagnosis Date  . Insomnia   . C. difficile diarrhea 9/06  . Diverticulosis   . Osteoarthritis, knee   . Depression   . Internal hemorrhoid   . Hyperplastic colon polyp   . HLD (hyperlipidemia)   . Anxiety    Past Surgical History  Procedure Laterality Date  . Tonsillectomy    . Vaginal hysterectomy    . Cochlear implant  July '12    right ear. Dr. Lenoria Farrier  . Bunionectomy      right   Family History  Problem Relation Age of Onset  . Breast cancer Maternal Aunt     aunts  . Stroke Sister   . Ovarian cancer Mother   . Transient ischemic attack Mother   . Anxiety disorder Daughter    History   Social History  . Marital Status: Widowed    Spouse Name: N/A    Number of Children: 2  . Years of Education: N/A   Occupational History  . retired    Social History Main Topics  . Smoking status: Former Games developer  . Smokeless tobacco: Never Used  . Alcohol Use: Yes     Comment: 1 glass of wine a day  . Drug Use: No  . Sexual Activity: Not on file   Other Topics Concern  . Not on file   Social History Narrative  . No narrative on file     Current Outpatient Prescriptions on File Prior to Visit  Medication Sig Dispense Refill  . temazepam (RESTORIL) 15 MG capsule Take 1 capsule (15 mg total) by mouth at bedtime as needed for sleep.  30 capsule  5  . Probiotic Product (ALIGN) 4 MG CAPS Take 1 capsule by mouth daily.  14 capsule  0    No current facility-administered medications on file prior to visit.      Review of Systems System review is negative for any constitutional, cardiac, pulmonary, GI or neuro symptoms or complaints other than as described in the HPI.     Objective:   Physical Exam Filed Vitals:   08/15/13 1520  BP: 122/92  Pulse: 99  Temp: 97.1 F (36.2 C)   Gen'l- WNWD woman in no distress Cor 2+ radial pulse, quiet precordium, RRR, no carotid bruits Pulm - normal  12 EKG - RBBB       Assessment & Plan:

## 2013-08-16 NOTE — Assessment & Plan Note (Signed)
Angela Barber presents for two episodes of typical chest pain at rest: heavy feeling of the chest (elephant) along with mild SOB, mild nausea. No exertional symptoms. Risk profile moderate: post-menopausal woman, mild hyperlipidemia, minimally overweight. EKG with RBBB  Plan Cardiology consult - to determine best risk stratification plan.  Continue ASA 81 mg daily  Call for any recurrent chest pain.

## 2013-08-25 ENCOUNTER — Ambulatory Visit (HOSPITAL_COMMUNITY): Payer: Self-pay | Admitting: Licensed Clinical Social Worker

## 2013-08-31 ENCOUNTER — Encounter: Payer: Self-pay | Admitting: Cardiology

## 2013-08-31 ENCOUNTER — Ambulatory Visit (INDEPENDENT_AMBULATORY_CARE_PROVIDER_SITE_OTHER): Payer: Medicare Other | Admitting: Cardiology

## 2013-08-31 VITALS — BP 128/80 | HR 88 | Ht 63.0 in | Wt 150.1 lb

## 2013-08-31 DIAGNOSIS — R079 Chest pain, unspecified: Secondary | ICD-10-CM

## 2013-08-31 NOTE — Progress Notes (Signed)
Angela Barber Date of Birth:  05-30-1936 [address] [place], [place]  [address] [phone]         Fax   [phone]  History of Present Illness: This pleasant 77 year old woman is seen at the request of Dr. Debby Bud..  She is being seen because of recent episodes of substernal chest pain which occurred on 2 consecutive days 2-3 weeks ago.  She described the discomfort as an "elephant sitting on her chest".  The first episode occurred while she was shopping and lasted about 10 minutes.  Following day she had a second episode which was similar but was also associated with nausea and weakness.  She subsequently he was seen on 08/15/13 by Dr. Debby Bud at which time her electrocardiogram showed normal sinus rhythm and incomplete right bundle branch block pattern no acute changes.  She comes to the office today for further evaluation.  She states that she has had no further episodes since November.  Risk factors include the fact that her brother has had CABG at a young age.  The patient herself has had elevated LDL level 131 when it was last checked in 2011.  She smoked briefly as a young woman.  She does not have any history of diabetes or hypertension.  Her only cardiac medication is a baby aspirin daily.  Current Outpatient Prescriptions  Medication Sig Dispense Refill  . aspirin EC 81 MG tablet Take 1 tablet (81 mg total) by mouth daily.      Marland Kitchen CALCIUM CITRATE PO Take by mouth.      . cholecalciferol (VITAMIN D) 1000 UNITS tablet Take 1,000 Units by mouth daily.      . Probiotic Product (ALIGN) 4 MG CAPS Take 1 capsule by mouth daily.  14 capsule  0  . Specialty Vitamins Products (MAGNESIUM, AMINO ACID CHELATE,) 133 MG tablet Take 2 tablets by mouth daily.      . temazepam (RESTORIL) 15 MG capsule Take 1 capsule (15 mg total) by mouth at bedtime as needed for sleep.  30 capsule  5   No current facility-administered medications for this visit.    No Known  Allergies  Patient Active Problem List   Diagnosis Date Noted  . Adjustment disorder with mixed anxiety and depressed mood 05/19/2013  . Overlapping toe 01/18/2012  . Paresthesia of lower extremity 01/18/2012  . 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 SLEEP DISORDER, CHRONIC 05/25/2009  . HYPERLIPIDEMIA, MILD 05/24/2009  . DECREASED HEARING, BILATERAL 04/25/2008  . ABDOMINAL PAIN, LEFT LOWER QUADRANT 12/29/2007  . CHEST PAIN, ATYPICAL, HX OF 12/29/2007  . HERPES SIMPLEX INFECTION, TYPE I 10/23/2007  . DIVERTICULITIS, COLON 10/23/2007  . OSTEOARTHRITIS, KNEE 10/23/2007  . DEPRESSION, HX OF 10/23/2007  . CLOSTRIDIUM DIFFICILE COLITIS, HX OF 10/23/2007  . TONSILLECTOMY, HX OF 10/23/2007    History  Smoking status  . Former Smoker  Smokeless tobacco  . Never Used    History  Alcohol Use  . Yes    Comment: 1 glass of wine a day    Family History  Problem Relation Age of Onset  . Breast cancer Maternal Aunt     aunts  . Stroke Sister   . Ovarian cancer Mother   . Transient ischemic attack Mother   . Anxiety disorder Daughter     Review of Systems: Constitutional: no fever chills diaphoresis or fatigue or change in weight.  Head and neck: no hearing loss, no epistaxis, no  photophobia or visual disturbance.  She has a cochlear implant in her right ear Respiratory: No cough, shortness of breath or wheezing. Cardiovascular: No  peripheral edema, palpitations.  Positive for chest pain Gastrointestinal: No abdominal distention, no abdominal pain, no change in bowel habits hematochezia or melena. Genitourinary: No dysuria, no frequency, no urgency, no nocturia. Musculoskeletal:No arthralgias, no back pain, no gait disturbance or myalgias. Neurological: No dizziness, no headaches, no numbness, no seizures, no syncope, no weakness, no tremors. Hematologic: No lymphadenopathy, no easy bruising. Psychiatric: No confusion, no hallucinations, no sleep  disturbance.    Physical Exam: Filed Vitals:   08/31/13 1548  BP: 128/80  Pulse: 88   the general appearance reveals a well-developed well-nourished woman in no distress.The head and neck exam reveals pupils equal and reactive.  Extraocular movements are full.  There is no scleral icterus.  The mouth and pharynx are normal.  The neck is supple.  The carotids reveal no bruits.  The jugular venous pressure is normal.  The  thyroid is not enlarged.  There is no lymphadenopathy.  The chest is clear to percussion and auscultation.  There are no rales or rhonchi.  Expansion of the chest is symmetrical.  The precordium is quiet.  The first heart sound is normal.  The second heart sound is physiologically split.  There is no murmur gallop rub or click.  There is no abnormal lift or heave.  The abdomen is soft and nontender.  The bowel sounds are normal.  The liver and spleen are not enlarged.  There are no abdominal masses.  There are no abdominal bruits.  Extremities reveal good pedal pulses.  There is no phlebitis or edema.  There is no cyanosis or clubbing.  Strength is normal and symmetrical in all extremities.  There is no lateralizing weakness.  There are no sensory deficits.  The skin is warm and dry.  There is no rash.  EKG reviewed from 08/15/13 shows normal sinus rhythm and incomplete right bundle branch block and low voltage. The patient has not had a recent chest x-ray.   Assessment / Plan: 1. precordial chest discomfort uncertain etiology possible ischemic in origin 2. hypercholesterolemia by history 3. incomplete right bundle branch block  Plan: We will update her chest x-ray.  We will have her return for a Lexus scan Myoview stress test to evaluate her chest pain further.  No new medications prescribed.  However she will continue to take her daily baby aspirin. Many thanks for the opportunity to see this pleasant woman with you.  We will be in touch with you regarding the results of her  nuclear stress test.

## 2013-08-31 NOTE — Patient Instructions (Signed)
Your physician has requested that you have a lexiscan myoview. For further information please visit [email or web address] Please follow instruction sheet, as given.  Your physician recommends that you continue on your current medications as directed. Please refer to the Current Medication list given to you today.  Follow up as needed   Go for a chest xray soon to the [facility] at the [facility], anytime after 8:00 am

## 2013-09-01 ENCOUNTER — Ambulatory Visit
Admission: RE | Admit: 2013-09-01 | Discharge: 2013-09-01 | Disposition: A | Discharge status: Home / Self Care | Payer: Medicare Other | Source: Ambulatory Visit | Attending: Cardiology | Admitting: Cardiology

## 2013-09-01 DIAGNOSIS — R079 Chest pain, unspecified: Secondary | ICD-10-CM

## 2013-09-13 ENCOUNTER — Telehealth: Payer: Self-pay | Admitting: *Deleted

## 2013-09-13 NOTE — Telephone Encounter (Signed)
Message copied by Burnell Blanks on Tue Sep 13, 2013  9:23 AM ------      Message from: Cassell Clement      Created: Thu Sep 01, 2013  5:05 PM       Please report.  Chest xray is normal. Heart size normal. ------

## 2013-09-13 NOTE — Telephone Encounter (Signed)
Advised patient

## 2013-09-21 ENCOUNTER — Encounter: Payer: Self-pay | Admitting: Cardiology

## 2013-09-21 ENCOUNTER — Ambulatory Visit (HOSPITAL_COMMUNITY): Payer: Medicare Other | Attending: Cardiology | Admitting: Radiology

## 2013-09-21 VITALS — BP 105/79 | Ht 63.0 in | Wt 148.0 lb

## 2013-09-21 DIAGNOSIS — R0609 Other forms of dyspnea: Secondary | ICD-10-CM | POA: Insufficient documentation

## 2013-09-21 DIAGNOSIS — R079 Chest pain, unspecified: Secondary | ICD-10-CM

## 2013-09-21 DIAGNOSIS — R0789 Other chest pain: Secondary | ICD-10-CM | POA: Insufficient documentation

## 2013-09-21 DIAGNOSIS — R0989 Other specified symptoms and signs involving the circulatory and respiratory systems: Secondary | ICD-10-CM | POA: Insufficient documentation

## 2013-09-21 DIAGNOSIS — Z8249 Family history of ischemic heart disease and other diseases of the circulatory system: Secondary | ICD-10-CM | POA: Insufficient documentation

## 2013-09-21 DIAGNOSIS — R002 Palpitations: Secondary | ICD-10-CM | POA: Insufficient documentation

## 2013-09-21 DIAGNOSIS — Z87891 Personal history of nicotine dependence: Secondary | ICD-10-CM | POA: Insufficient documentation

## 2013-09-21 MED ORDER — TECHNETIUM TC 99M SESTAMIBI GENERIC - CARDIOLITE
30.0000 | Freq: Once | INTRAVENOUS | Status: AC | PRN
  Administered 2013-09-21: 30 via INTRAVENOUS

## 2013-09-21 MED ORDER — TECHNETIUM TC 99M SESTAMIBI GENERIC - CARDIOLITE
10.0000 | Freq: Once | INTRAVENOUS | Status: AC | PRN
  Administered 2013-09-21: 10 via INTRAVENOUS

## 2013-09-21 MED ORDER — REGADENOSON 0.4 MG/5ML IV SOLN
0.4000 mg | Freq: Once | INTRAVENOUS | Status: AC
  Administered 2013-09-21: 0.4 mg via INTRAVENOUS

## 2013-09-21 NOTE — Progress Notes (Signed)
[facility] SITE 3 NUCLEAR MED [address] [place], [place] [address] [phone]    Cardiology Nuclear Med Study  Angela Barber is a 77 y.o. female     MRN : [id number]     DOB: 04-10-1936  Procedure Date: 09/21/2013  Nuclear Med Background Indication for Stress Test:  Evaluation for Ischemia History:  no prior hx of CAD; MPI2008-normal, EF 83% Cardiac Risk Factors: Family History - CAD, History of Smoking and Lipids  Symptoms:  Chest Pain, DOE and Palpitations   Nuclear Pre-Procedure Caffeine/Decaff Intake:  None NPO After: 8:00pm   Lungs:  clear O2 Sat: 96% on room air. IV 0.9% NS with Angio Cath:  22g  IV Site: L Antecubital  IV Started by:  Frederick Peers, EMT-P  Chest Size (in):  40 Cup Size: D  Height: 5\' 3"  (1.6 m)  Weight:  148 lb (67.132 kg)  BMI:  Body mass index is 26.22 kg/(m^2). Tech Comments:  Patient takes no am meds    Nuclear Med Study 1 or 2 day study: 1 day  Stress Test Type:  Eugenie Birks  Reading MD: Marca Ancona, MD  Order Authorizing Provider:  Wylene Simmer  Resting Radionuclide: Technetium 10m Sestamibi  Resting Radionuclide Dose: 11.0 mCi   Stress Radionuclide:  Technetium 51m Sestamibi  Stress Radionuclide Dose: 33.0 mCi           Stress Protocol Rest HR: 79 Stress HR: 100  Rest BP: 105/73 Stress BP: 126/84  Exercise Time (min): n/a METS: n/a   Predicted Max HR: 145 bpm % Max HR: 66.9 bpm Rate Pressure Product: 21308   Dose of Adenosine (mg):  n/a Dose of Lexiscan: 0.4 mg  Dose of Atropine (mg): n/a Dose of Dobutamine: n/a mcg/kg/min (at max HR)  Stress Test Technologist: Frederick Peers, EMT-P  Nuclear Technologist:  Domenic Polite, CNMT     Rest Procedure:  Myocardial perfusion imaging was performed at rest 45 minutes following the intravenous administration of Technetium 40m Sestamibi. Rest ECG: NSR - Normal EKG  Stress Procedure:  The patient received IV Lexiscan 0.4 mg over 15-seconds.  Technetium 29m Sestamibi  injected at 30-seconds.  Quantitative spect images were obtained after a 45 minute delay. Stress ECG: No significant change from baseline ECG  QPS Raw Data Images:  Normal; no motion artifact; normal heart/lung ratio. Stress Images:  Small, mild apical perfusion defect. Rest Images:  Small, mild apical perfusion defect. Subtraction (SDS):  Fixed small, mild apical perfusion defect. Transient Ischemic Dilatation (Normal <1.22):  0.92 Lung/Heart Ratio (Normal <0.45):  0.38  Quantitative Gated Spect Images QGS EDV:  37 ml QGS ESV:  6 ml  Impression Exercise Capacity:  Lexiscan with no exercise. BP Response:  Normal blood pressure response. Clinical Symptoms:  Chest heaviness. ECG Impression:  No significant ST segment change suggestive of ischemia. Comparison with Prior Nuclear Study: No images to compare  Overall Impression:  Low risk stress nuclear study with a fixed, small mild apical perfusion defect.  Given normal wall motion, I suspect that this is soft tissue attenuation. No evidence for ischemia. .  LV Ejection Fraction: 83%.  LV Wall Motion:  NL LV Function; NL Wall Motion  Marca Ancona 09/21/2013

## 2013-09-26 ENCOUNTER — Other Ambulatory Visit: Payer: Self-pay

## 2013-09-26 MED ORDER — TEMAZEPAM 15 MG PO CAPS: 15.0000 mg | ORAL_CAPSULE | Freq: Every evening | ORAL | Status: DC | PRN

## 2013-09-28 ENCOUNTER — Telehealth: Payer: Self-pay | Admitting: Cardiology

## 2013-09-28 NOTE — Telephone Encounter (Signed)
Advised patient of results.  

## 2013-09-28 NOTE — Telephone Encounter (Signed)
Message copied by Earvin Hansen on Wed Sep 28, 2013 10:22 AM ------      Message from: Darlin Coco      Created: Mon Sep 26, 2013  2:27 PM       Please report.  The nuclear stress test did not show any evidence of ischemia.  Left ventricular function was excellent.  Continue current medication. ------

## 2013-09-28 NOTE — Telephone Encounter (Signed)
New Problem:  Pt states she is very hard of hearing. Pt is requesting she hear her recent test results.

## 2013-10-21 ENCOUNTER — Encounter: Payer: Self-pay | Admitting: Internal Medicine

## 2013-11-08 ENCOUNTER — Encounter: Payer: Medicare Other | Admitting: Internal Medicine

## 2014-02-07 ENCOUNTER — Encounter: Payer: Self-pay | Admitting: Physician Assistant

## 2014-02-07 ENCOUNTER — Ambulatory Visit (INDEPENDENT_AMBULATORY_CARE_PROVIDER_SITE_OTHER): Payer: Medicare Other | Admitting: Physician Assistant

## 2014-02-07 VITALS — BP 120/80 | HR 99 | Temp 97.9°F | Resp 18 | Wt 152.0 lb

## 2014-02-07 DIAGNOSIS — J069 Acute upper respiratory infection, unspecified: Secondary | ICD-10-CM

## 2014-02-07 DIAGNOSIS — B9789 Other viral agents as the cause of diseases classified elsewhere: Principal | ICD-10-CM

## 2014-02-07 NOTE — Patient Instructions (Signed)
Symptomatic management for viral upper respiratory infection.  Ibuprofen for body aches.  Tylenol for fever.  Plain over the counter Mucinex for thick secretions.  Throat lozenges or throat sprays for sore throat.  Drink plenty of fluids and maintain hydration.  It is in your great-grandsons best interest for you to not touch him or pick him up while you are still feeling ill. As soon as you feel like you're symptoms have resolved, you may interact with him as you please.  Since you do not have your Tdap, we need to vaccinate you as soon as you are well. We will followup 2 weeks after her symptoms have resolved in order to vaccinate with Tdap.  Followup in one week to reassess, or sooner if symptoms fail to resolve or worsen despite symptomatic treatment.   Upper Respiratory Infection, Adult An upper respiratory infection (URI) is also known as the common cold. It is often caused by a type of germ (virus). Colds are easily spread (contagious). You can pass it to others by kissing, coughing, sneezing, or drinking out of the same glass. Usually, you get better in 1 or 2 weeks.  HOME CARE   Only take medicine as told by your doctor.  Use a warm mist humidifier or breathe in steam from a hot shower.  Drink enough water and fluids to keep your pee (urine) clear or pale yellow.  Get plenty of rest.  Return to work when your temperature is back to normal or as told by your doctor. You may use a face mask and wash your hands to stop your cold from spreading. GET HELP RIGHT AWAY IF:   After the first few days, you feel you are getting worse.  You have questions about your medicine.  You have chills, shortness of breath, or brown or red spit (mucus).  You have yellow or brown snot (nasal discharge) or pain in the face, especially when you bend forward.  You have a fever, puffy (swollen) neck, pain when you swallow, or white spots in the back of your throat.  You have a bad headache,  ear pain, sinus pain, or chest pain.  You have a high-pitched whistling sound when you breathe in and out (wheezing).  You have a lasting cough or cough up blood.  You have sore muscles or a stiff neck. MAKE SURE YOU:   Understand these instructions.  Will watch your condition.  Will get help right away if you are not doing well or get worse. Document Released: 02/25/2008 Document Revised: 12/01/2011 Document Reviewed: 01/13/2011 [facility] Patient Information 2014 [place], [place].

## 2014-02-07 NOTE — Progress Notes (Signed)
Pre visit review using our clinic review tool, if applicable. No additional management support is needed unless otherwise documented below in the visit note. 

## 2014-02-07 NOTE — Progress Notes (Signed)
Subjective:    Patient ID: Angela Barber, female    DOB: [birth year], 78 y.o.   MRN: [id number]  Cough This is a new problem. The current episode started in the past 7 days. The problem has been unchanged. The problem occurs every few minutes. The cough is productive of sputum. Associated symptoms include chills, a fever, nasal congestion, postnasal drip, a sore throat and shortness of breath. Pertinent negatives include no chest pain, ear congestion, ear pain, headaches, heartburn, hemoptysis, myalgias, rash, rhinorrhea, sweats, weight loss or wheezing. Nothing aggravates the symptoms. Treatments tried: Tussin. The treatment provided mild relief. Her past medical history is significant for environmental allergies. There is no history of asthma or COPD.     Review of Systems  Constitutional: Positive for fever and chills. Negative for weight loss.  HENT: Positive for postnasal drip and sore throat. Negative for ear pain and rhinorrhea.   Respiratory: Positive for cough and shortness of breath. Negative for hemoptysis and wheezing.   Cardiovascular: Negative for chest pain.  Gastrointestinal: Negative for heartburn, nausea, vomiting and diarrhea.  Musculoskeletal: Negative for myalgias.  Skin: Negative for rash.  Allergic/Immunologic: Positive for environmental allergies.  Neurological: Negative for headaches.  All other systems reviewed and are negative.    Past Medical History  Diagnosis Date  . Insomnia   . C. difficile diarrhea 9/06  . Diverticulosis   . Osteoarthritis, knee   . Depression   . Internal hemorrhoid   . Hyperplastic colon polyp   . HLD (hyperlipidemia)   . Anxiety    Past Surgical History  Procedure Laterality Date  . Tonsillectomy    . Vaginal hysterectomy    . Cochlear implant  July '12    right ear. Dr. Idelle Crouch  . Bunionectomy      right    reports that she has quit smoking. She has never used smokeless tobacco. She reports that she drinks alcohol.  She reports that she does not use illicit drugs. family history includes Anxiety disorder in her daughter; Breast cancer in her maternal aunt; Ovarian cancer in her mother; Stroke in her sister; Transient ischemic attack in her mother. No Known Allergies      Objective:   Physical Exam  Nursing note and vitals reviewed. Constitutional: She is oriented to person, place, and time. She appears well-developed and well-nourished. No distress.  HENT:  Head: Normocephalic and atraumatic.  Right Ear: External ear normal.  Left Ear: External ear normal.  Nose: Nose normal.  Mouth/Throat: No oropharyngeal exudate.  Oropharynx is slightly erythematous, no exudate.  Bilateral tympanic membranes are normal in appearance.  Bilateral frontal and maxillary sinuses are nontender to palpation.   Eyes: Conjunctivae and EOM are normal. Pupils are equal, round, and reactive to light.  Neck: Normal range of motion. Neck supple. No JVD present.  Cardiovascular: Normal rate, regular rhythm, normal heart sounds and intact distal pulses.  Exam reveals no gallop and no friction rub.   No murmur heard. Pulmonary/Chest: Effort normal. No stridor. No respiratory distress. She has no wheezes. She has no rales. She exhibits no tenderness.  Mild rhonchi in bilateral lung bases.  Musculoskeletal: Normal range of motion.  Lymphadenopathy:    She has no cervical adenopathy.  Neurological: She is alert and oriented to person, place, and time.  Skin: Skin is warm and dry. No rash noted. She is not diaphoretic. No erythema. No pallor.  Psychiatric: She has a normal mood and affect. Her behavior is normal. Judgment and  thought content normal.    Filed Vitals:   02/07/14 1116  BP: 120/80  Pulse: 99  Temp: 97.9 F (36.6 C)  Resp: 18   Lab Results  Component Value Date   WBC 7.6 05/24/2009   HGB 14.7 05/24/2009   HCT 43.9 05/24/2009   PLT 236.0 05/24/2009   GLUCOSE 89 02/19/2010   CHOL 196 02/19/2010   TRIG 71.0  02/19/2010   HDL 51.30 02/19/2010   LDLCALC 131* 02/19/2010   NA 143 02/19/2010   K 4.3 02/19/2010   CL 103 02/19/2010   CREATININE 0.7 02/19/2010   BUN 18 02/19/2010   CO2 31 02/19/2010   TSH 2.76 01/16/2012        Assessment & Plan:  [name] was seen today for cough.  Diagnoses and associated orders for this visit:  Viral URI with cough - Symptoms are waning. Continue symptomatic treatment with over-the-counter. Patient advised on not holding or touching 83-month-old great-grandson until after her symptoms are resolving.  - Patient will followup 2 weeks after illness resolves in order to receive Tdap vaccination.  - Patient will followup in one week to reassess and assure symptom resolution.   Patient Instructions  Symptomatic management for viral upper respiratory infection.  Ibuprofen for body aches.  Tylenol for fever.  Plain over the counter Mucinex for thick secretions.  Throat lozenges or throat sprays for sore throat.  Drink plenty of fluids and maintain hydration.  It is in your great-grandsons best interest for you to not touch him or pick him up while you are still feeling ill. As soon as you feel like you're symptoms have resolved, you may interact with him as you please.  Since you do not have your Tdap, we need to vaccinate you as soon as you are well. We will followup 2 weeks after her symptoms have resolved in order to vaccinate with Tdap.  Followup in one week to reassess, or sooner if symptoms fail to resolve or worsen despite symptomatic treatment.

## 2014-02-14 ENCOUNTER — Ambulatory Visit: Payer: Self-pay | Admitting: Physician Assistant

## 2014-03-09 ENCOUNTER — Other Ambulatory Visit: Payer: Self-pay | Admitting: Dermatology

## 2014-03-23 ENCOUNTER — Telehealth: Payer: Self-pay

## 2014-03-23 NOTE — Telephone Encounter (Signed)
Left a message for return call.  

## 2014-03-23 NOTE — Telephone Encounter (Signed)
I saw this pt 1 time for an acute visit. Temazepam was never discussed, so I will not fill this. I am not PCP for this pt, nor was that ever discussed.

## 2014-03-23 NOTE — Telephone Encounter (Signed)
Rx request for temazepam 15 mg capsule.  Pls advise.

## 2014-03-27 ENCOUNTER — Telehealth: Payer: Self-pay | Admitting: Internal Medicine

## 2014-03-27 NOTE — Telephone Encounter (Signed)
Pt needs the Temazepam refilled at [facility].  She has an appt Nov. 2 to est with Dr. Doug Sou.

## 2014-03-27 NOTE — Telephone Encounter (Signed)
Left a message for pt to return call 

## 2014-03-28 NOTE — Telephone Encounter (Signed)
Left a message for return call.  

## 2014-03-29 NOTE — Telephone Encounter (Signed)
Appt on July 13.

## 2014-03-29 NOTE — Telephone Encounter (Signed)
LMOM to call for an appt for refills.

## 2014-03-29 NOTE — Telephone Encounter (Signed)
She needs to be seen.

## 2014-04-03 ENCOUNTER — Ambulatory Visit (INDEPENDENT_AMBULATORY_CARE_PROVIDER_SITE_OTHER): Payer: Medicare Other | Admitting: Internal Medicine

## 2014-04-03 ENCOUNTER — Encounter: Payer: Self-pay | Admitting: Internal Medicine

## 2014-04-03 VITALS — BP 112/72 | HR 88 | Temp 97.9°F | Wt 154.6 lb

## 2014-04-03 DIAGNOSIS — G479 Sleep disorder, unspecified: Secondary | ICD-10-CM

## 2014-04-03 MED ORDER — TEMAZEPAM 15 MG PO CAPS: 15.0000 mg | ORAL_CAPSULE | Freq: Every evening | ORAL | Status: DC | PRN

## 2014-04-03 NOTE — Progress Notes (Signed)
Pre visit review using our clinic review tool, if applicable. No additional management support is needed unless otherwise documented below in the visit note. 

## 2014-04-03 NOTE — Progress Notes (Addendum)
   Subjective:    Patient ID: Angela Barber, female    DOB: August 13, 1936, 78 y.o.   MRN: [id number]  HPI  Insomnia  Onset: Over 30 years ago Pattern: Asleep at 11 pm, about 6 hours of sleep, gets up at night and has trouble falling back asleep Difficulty going to sleep: None Frequent awakening: 1x- several times depends on schedule Early awakening: 4 am typically Nightmares: Denies Abnormal leg movement: Denies Snoring: Yes, Family members and roommates notice Apnea: Roommates have suspected, Sleep study was cancelled because of inconvenience  Risk factors/sleep hygiene: Herbal tea decaffeinated at night  Stimulants: Coffee about 2 cups in the am Alcohol intake: wine with meals 1 x week typically Reading, watching TV, eating @ bedtime: Yes Daytime naps: Only for a few minutes rarely Stress/anxiety: Decreased because of lifestyle changes Work/travel factors: Volunteering at [employer], [place] 1 year ago, 2nd home at [employer] Impact: None Daytime hypersomnolence: Denies Motor vehicle accident/motor dysfunction: Denies Treatment to date/efficacy: Restoril 15 mg for over 2 years, feels it works well at 15 mg  Review of Systems Positive for snoring, suspected apneic events  Denies swollen lips/tongue, hypersomnolence, stress, anxiety, weakness, suicidal thoughts, abnormal leg movements, N/V    Objective:   Physical Exam  Constitutional: She is oriented to person, place, and time. She appears well-developed and well-nourished. No distress.  HENT:  Head: Normocephalic.  Cochlear implants Right ear only  Eyes: Conjunctivae and EOM are normal. Pupils are equal, round, and reactive to light. Right eye exhibits no discharge. Left eye exhibits no discharge. No scleral icterus.  Neck: Normal range of motion. No JVD present. No tracheal deviation present. No thyromegaly present.  Cardiovascular: Normal rate, regular rhythm and intact distal pulses.  Exam reveals no gallop and no friction  rub.   No murmur heard. Pulmonary/Chest: Effort normal and breath sounds normal. No stridor. No respiratory distress. She has no wheezes. She exhibits no tenderness.  Abdominal: Soft. Bowel sounds are normal. She exhibits no distension and no mass. There is no tenderness. There is no rebound and no guarding.  Musculoskeletal: Normal range of motion. She exhibits no edema and no tenderness.  Lymphadenopathy:    She has no cervical adenopathy.  Neurological: She is alert and oriented to person, place, and time. She has normal reflexes. She displays normal reflexes. No cranial nerve deficit. Coordination normal.  Skin: Skin is warm and dry. No rash noted. She is not diaphoretic. No erythema. No pallor.  Psychiatric: Judgment and thought content normal.          Assessment & Plan:

## 2014-04-03 NOTE — Patient Instructions (Signed)
I recommend a Sleep Medicine consultation to determine cause  & optimal therapy.

## 2014-04-03 NOTE — Assessment & Plan Note (Signed)
   I discussed frankly the risk of taking Restoril at her age with increased risk of mental status impairment and risk of falling and injuries.  I told her I would refill the prescription once; but I would not refill it unless she were evaluated by a sleep specialist.  Referral will be made.

## 2014-04-03 NOTE — Progress Notes (Signed)
Subjective:    Patient ID: Angela Barber, female    DOB: December 08, 1935, 78 y.o.   MRN: [id number]  HPI   She's had a chronic sleep disorder for over 3 decades.  She will typically go to sleep at 11 PM and sleep for only 6 hours. She gets up at night and has trouble falling back asleep. There is no difficulty going to sleep. She will awaken 1 to several times depending on her schedule. She awakens at 4 AM typically  She denies nightmares or abnormal leg movements  Family members and roommates have told she snores. Roommates have suspected possible apnea.  A sleep study and had been scheduled but she canceled it because it was "inconvenient".  She has herbal tea which is decaffeinated at night. She has 2 cups of coffee in the morning. She drinks a glass of wine with meals approximately one time a week typically. She will read, watch TV, or eat at times in bed.  Rarely she'll sleep during the day for a few minutes.  She denies excessive stress or anxiety because she has made lifestyle changes.  She volunteers @ [employer]. She has not traveled internationally for at least a year. She denies daytime somnolence. She's had no motor vehicle accidents related to somnolence.  She's used Restoril 15 mg for over 2 years and feels it works well.      Review of Systems  She has a history of paresthesias but these have resolved. This was evaluated with thyroid and B12 levels which were normal.  She denies any significant depression at this time.   She states that they cochlear implant device has been suboptimal as to improvement in her condition.      Objective:   Physical Exam  Significant or distinguishing  findings on physical exam are documented first.  Below that are other systems examined & findings.  She has a cochlear implant device behind the right ear.Marked hearing loss.  Gen.: Healthy and well-nourished in appearance. Alert, appropriate and cooperative throughout  exam. Appears younger than stated age  Head: Normocephalic without obvious abnormalities Eyes: No corneal or conjunctival inflammation noted. Pupils equal round reactive to light and accommodation. Extraocular motion intact.  Ears: External  ear exam reveals no significant lesions or deformities. Canals clear .TMs normal.  Nose: External nasal exam reveals no deformity or inflammation. Nasal mucosa are pink and moist. No lesions or exudates noted.   Mouth: Oral mucosa and oropharynx reveal no lesions or exudates. Teeth in good repair. Neck: No deformities, masses, or tenderness noted.   Thyroid normal Lungs: Normal respiratory effort; chest expands symmetrically. Lungs are clear to auscultation without rales, wheezes, or increased work of breathing. Heart: Normal rate and rhythm. Normal S1 and S2. No gallop, click, or rub. S4 w/o murmur. Abdomen: Bowel sounds normal; abdomen soft and nontender. No masses, organomegaly or hernias noted.                               Musculoskeletal/extremities: No deformity or scoliosis noted of  the thoracic or lumbar spine. No clubbing, cyanosis, edema, or significant extremity  deformity noted. Range of motion normal .Tone & strength normal. Hand joints normal Fingernail  health good.Minor fungal toenail changes Able to lie down & sit up w/o help. Negative SLR bilaterally Vascular: Carotid, radial artery, dorsalis pedis and  posterior tibial pulses are full and equal. No bruits present. Neurologic: Alert and  oriented x3. Deep tendon reflexes symmetrical and normal.  Gait normal  .      Skin: Intact without suspicious lesions or rashes. Lymph: No cervical, axillary lymphadenopathy present. Psych: Mood and affect are normal. Normally interactive                                                                                         Assessment & Plan:  See Current Assessment & Plan in Problem List under specific Diagnosis

## 2014-04-04 ENCOUNTER — Encounter (INDEPENDENT_AMBULATORY_CARE_PROVIDER_SITE_OTHER): Payer: Self-pay

## 2014-04-04 ENCOUNTER — Encounter: Payer: Self-pay | Admitting: Neurology

## 2014-04-04 ENCOUNTER — Ambulatory Visit (INDEPENDENT_AMBULATORY_CARE_PROVIDER_SITE_OTHER): Payer: Medicare Other | Admitting: Neurology

## 2014-04-04 VITALS — BP 138/91 | HR 84 | Resp 14 | Ht 63.5 in | Wt 154.0 lb

## 2014-04-04 DIAGNOSIS — F5102 Adjustment insomnia: Secondary | ICD-10-CM | POA: Insufficient documentation

## 2014-04-04 DIAGNOSIS — R0989 Other specified symptoms and signs involving the circulatory and respiratory systems: Secondary | ICD-10-CM

## 2014-04-04 DIAGNOSIS — R0683 Snoring: Secondary | ICD-10-CM

## 2014-04-04 DIAGNOSIS — G479 Sleep disorder, unspecified: Secondary | ICD-10-CM

## 2014-04-04 DIAGNOSIS — R0609 Other forms of dyspnea: Secondary | ICD-10-CM

## 2014-04-04 MED ORDER — TRAZODONE HCL 50 MG PO TABS: 50.0000 mg | ORAL_TABLET | Freq: Every day | ORAL | Status: DC

## 2014-04-04 MED ORDER — TEMAZEPAM 15 MG PO CAPS: 15.0000 mg | ORAL_CAPSULE | Freq: Every evening | ORAL | Status: DC | PRN

## 2014-04-04 NOTE — Patient Instructions (Signed)
Insomnia Insomnia is frequent trouble falling and/or staying asleep. Insomnia can be a long term problem or a short term problem. Both are common. Insomnia can be a short term problem when the wakefulness is related to a certain stress or worry. Long term insomnia is often related to ongoing stress during waking hours and/or poor sleeping habits. Overtime, sleep deprivation itself can make the problem worse. Every little thing feels more severe because you are overtired and your ability to cope is decreased. CAUSES   Stress, anxiety, and depression.  Poor sleeping habits.  Distractions such as TV in the bedroom.  Naps close to bedtime.  Engaging in emotionally charged conversations before bed.  Technical reading before sleep.  Alcohol and other sedatives. They may make the problem worse. They can hurt normal sleep patterns and normal dream activity.  Stimulants such as caffeine for several hours prior to bedtime.  Pain syndromes and shortness of breath can cause insomnia.  Exercise late at night.  Changing time zones may cause sleeping problems (jet lag). It is sometimes helpful to have someone observe your sleeping patterns. They should look for periods of not breathing during the night (sleep apnea). They should also look to see how long those periods last. If you live alone or observers are uncertain, you can also be observed at a sleep clinic where your sleep patterns will be professionally monitored. Sleep apnea requires a checkup and treatment. Give your caregivers your medical history. Give your caregivers observations your family has made about your sleep.  SYMPTOMS   Not feeling rested in the morning.  Anxiety and restlessness at bedtime.  Difficulty falling and staying asleep. TREATMENT   Your caregiver may prescribe treatment for an underlying medical disorders. Your caregiver can give advice or help if you are using alcohol or other drugs for self-medication. Treatment  of underlying problems will usually eliminate insomnia problems.  Medications can be prescribed for short time use. They are generally not recommended for lengthy use.  Over-the-counter sleep medicines are not recommended for lengthy use. They can be habit forming.  You can promote easier sleeping by making lifestyle changes such as:  Using relaxation techniques that help with breathing and reduce muscle tension.  Exercising earlier in the day.  Changing your diet and the time of your last meal. No night time snacks.  Establish a regular time to go to bed.  Counseling can help with stressful problems and worry.  Soothing music and white noise may be helpful if there are background noises you cannot remove.  Stop tedious detailed work at least one hour before bedtime. HOME CARE INSTRUCTIONS   Keep a diary. Inform your caregiver about your progress. This includes any medication side effects. See your caregiver regularly. Take note of:  Times when you are asleep.  Times when you are awake during the night.  The quality of your sleep.  How you feel the next day. This information will help your caregiver care for you.  Get out of bed if you are still awake after 15 minutes. Read or do some quiet activity. Keep the lights down. Wait until you feel sleepy and go back to bed.  Keep regular sleeping and waking hours. Avoid naps.  Exercise regularly.  Avoid distractions at bedtime. Distractions include watching television or engaging in any intense or detailed activity like attempting to balance the household checkbook.  Develop a bedtime ritual. Keep a familiar routine of bathing, brushing your teeth, climbing into bed at the same   time each night, listening to soothing music. Routines increase the success of falling to sleep faster.  Use relaxation techniques. This can be using breathing and muscle tension release routines. It can also include visualizing peaceful scenes. You can  also help control troubling or intruding thoughts by keeping your mind occupied with boring or repetitive thoughts like the old concept of counting sheep. You can make it more creative like imagining planting one beautiful flower after another in your backyard garden.  During your day, work to eliminate stress. When this is not possible use some of the previous suggestions to help reduce the anxiety that accompanies stressful situations. MAKE SURE YOU:   Understand these instructions.  Will watch your condition.  Will get help right away if you are not doing well or get worse. Document Released: 09/05/2000 Document Revised: 12/01/2011 Document Reviewed: 10/06/2007 [employer] Patient Information 2015 [employer]. This information is not intended to replace advice given to you by your health care provider. Make sure you discuss any questions you have with your health care provider.  

## 2014-04-04 NOTE — Progress Notes (Signed)
[facility]  Provider:  Larey Seat, M D  Referring Provider: Hendricks Limes, MD Primary Care Physician:  Vertell Novak, MD  Chief Complaint  Patient presents with  . New Evaluation    Room 10  . Sleep consult     Dear Dr. Linna Darner and Dr. Doug Sou ,  Thank you for referring your patient for a  sleep consultation.   HPI:  Angela Barber is a 78 y.o.caucasian , right handed  female widow , who is seen here as a referral from Dr. Unice Cobble for a sleep consultation,   A former patient of Dr. Linda Hedges, who is now retired and now followed by Dr. Gilmore Laroche has been taking Restoril to help her sleep for well over a decade . She has not as many problems to go to sleep than to stay asleep.  She lost her husband following a stroke 10 years ago, an this changed her sleep pattern from there on. She was his main caretaker for 18 month. She is a retired Pharmacist, hospital and recalls having a life long  "tendency  to solve problems in her night time" , affecting her sleep in the process-" too worried to sleep" .  She volunteers at [employer] ( [employer]).  She goes to sleep between 9 PM and 11 PM, usually independent of the time she goes to bed, she wakes within the next 6 hours- that is the usual duration of night time sleep.She sleeps with her dog, and lives alone. Her bedroom is cool, quiet, dark.  She sets an alarm for those days she works at [employer]. Apnea has been witnessed by her granddaughter. There is a positional component that her granddaughter identified.  Her daughter and grandchild and Friends at [employer] have witnessed snoring, at times very loud.  She has on average one to two bathroom breaks, and has trouble after that to return to sleep.  In nights without Restoril ( temazepam ) she sleeps only 2-3 hours. She has used Ambien and xanax.  She does drink caffeineated beverages in the form of green teas, more often herbal teas,  rarely a SODA, mostly  on the road.   She has a history of dementia in her mother , beginning in her mid 42. Father died in his late 58's, healthy .  She is worried about the possible memory impact of benzodiazepines.   Review of Systems: Out of a complete 14 system review, the patient complains of only the following symptoms, and all other reviewed systems are negative. FSS 28, Epworth 12 points, hearing loss, cochlear implant.  GDS 2   History   Social History  . Marital Status: Widowed    Spouse Name: N/A    Number of Children: 2  . Years of Education: Master's   Occupational History  . retired    Social History Main Topics  . Smoking status: Former Research scientist (life sciences)  . Smokeless tobacco: Never Used  . Alcohol Use: Yes     Comment: 1 glass of wine a day  . Drug Use: No  . Sexual Activity: Not on file   Other Topics Concern  . Not on file   Social History Narrative   Patient is widowed and lives alone.   Patient has two adult children.   Patient is retired.   Patient has a Scientist, water quality.   Patient is right-handed.   Patient drinks two cups of coffee daily.    Family History  Problem Relation  Age of Onset  . Breast cancer Maternal Aunt     aunts  . Stroke Sister   . Ovarian cancer Mother   . Transient ischemic attack Mother   . Anxiety disorder Daughter     Past Medical History  Diagnosis Date  . Insomnia   . C. difficile diarrhea 9/06  . Diverticulosis   . Osteoarthritis, knee   . Depression   . Internal hemorrhoid   . Hyperplastic colon polyp   . HLD (hyperlipidemia)   . Anxiety   . Hearing loss of both ears   . Cochlear implant status     Past Surgical History  Procedure Laterality Date  . Tonsillectomy    . Vaginal hysterectomy    . Cochlear implant  July '13    right ear. Dr. Idelle Crouch  . Bunionectomy      right  . Rotator cuff repair Left 2012  . Rotator cuff repair Right 2007 & 2009    Current Outpatient Prescriptions  Medication Sig Dispense Refill  .  aspirin EC 81 MG tablet Take 1 tablet (81 mg total) by mouth daily.      Marland Kitchen CALCIUM CITRATE PO Take by mouth.      . cholecalciferol (VITAMIN D) 1000 UNITS tablet Take 1,000 Units by mouth daily.      . Efinaconazole (JUBLIA EX) Apply 1 drop topically daily.      . Probiotic Product (ALIGN) 4 MG CAPS Take 1 capsule by mouth daily.  14 capsule  0  . Specialty Vitamins Products (MAGNESIUM, AMINO ACID CHELATE,) 133 MG tablet Take 2 tablets by mouth daily.      . temazepam (RESTORIL) 15 MG capsule Take 1 capsule (15 mg total) by mouth at bedtime as needed for sleep.  30 capsule  0   No current facility-administered medications for this visit.    Allergies as of 04/04/2014  . (No Known Allergies)    Vitals: BP 138/91  Pulse 84  Resp 14  Ht 5' 3.5" (1.613 m)  Wt 154 lb (69.854 kg)  BMI 26.85 kg/m2 Last Weight:  Wt Readings from Last 1 Encounters:  04/04/14 154 lb (69.854 kg)   Last Height:   Ht Readings from Last 1 Encounters:  04/04/14 5' 3.5" (1.613 m)    Physical exam:  General: The patient is awake, alert and appears not in acute distress. The patient is well groomed. Head: Normocephalic, atraumatic. Neck is supple. Mallampati 2-3 , neck circumference: 14.5 inches, retrognathia, no nasal obstruction.  Cardiovascular:  Regular rate and rhythm , without  murmurs or carotid bruit, and without distended neck veins. Respiratory: Lungs are clear to auscultation. Skin:  Without evidence of edema, or rash Trunk: BMI is  Elevated, but the  patient  has normal posture.  Neurologic exam : The patient is awake and alert, oriented to place and time.  Memory subjective described as intact. There is a normal attention span & concentration ability. Speech is fluent without  dysarthria, dysphonia or aphasia. Mood and affect are appropriate. She has significant hearing loss.   Cranial nerves: Pupils are equal and briskly reactive to light. Funduscopic exam without  evidence of pallor or  edema. Extraocular movements  in vertical and horizontal planes intact and without nystagmus. Visual fields by finger perimetry are intact. Hearing to finger rub intact.  Facial sensation intact to fine touch. Facial motor strength is symmetric and tongue and uvula move midline.  Motor exam:   Normal tone , muscle bulk and symmetric  normal strength in all extremities.  Sensory:  Fine touch, pinprick and vibration were normal.  Coordination: Rapid alternating movements in the fingers/hands is normal.  Finger-to-nose maneuver tested and normal without evidence of ataxia, dysmetria or tremor.  Gait and station: Patient walks without assistive device . Strength within normal limits. Stance is stable and normal. Tandem gait is  unfragmented. Romberg testing is normal for age .  Deep tendon reflexes: in the  upper and lower extremities are symmetric and intact. Babinski maneuver response is  downgoing.   Assessment:  After physical and neurologic examination, review of laboratory studies, imaging, neurophysiology testing and pre-existing records, assessment is  1) Risk factors for OSA are retrognathia and BMI and age related muscle tone decrease .  She has been witnessed to snore and have apnea. Nocturia and insomnia.   Plan:  Treatment plan and additional workup :   SPLIT at AHI 15 and score at 4%. Trazodone to be used 3 days a week, to relief of daily benzodiazepine use.  Return with [facility] testing.

## 2014-04-04 NOTE — Addendum Note (Signed)
Addended by: Larey Seat on: 04/04/2014 02:30 PM   Modules accepted: Orders

## 2014-05-08 ENCOUNTER — Ambulatory Visit (INDEPENDENT_AMBULATORY_CARE_PROVIDER_SITE_OTHER): Payer: Medicare Other

## 2014-05-08 DIAGNOSIS — R0683 Snoring: Secondary | ICD-10-CM

## 2014-05-08 DIAGNOSIS — G4700 Insomnia, unspecified: Secondary | ICD-10-CM

## 2014-05-08 DIAGNOSIS — F5102 Adjustment insomnia: Secondary | ICD-10-CM

## 2014-05-08 DIAGNOSIS — G4733 Obstructive sleep apnea (adult) (pediatric): Secondary | ICD-10-CM

## 2014-05-22 ENCOUNTER — Other Ambulatory Visit: Payer: Self-pay | Admitting: Internal Medicine

## 2014-05-22 NOTE — Telephone Encounter (Signed)
#  30 This medication is among those which experts have documented to have a very  high risk of affecting  mental  alertness  & balance. This results in increased risk of falling with serious health or life threatening injury. Such medication should be taken as infrequently as possible and @  the lowest possible dose.It should not be taken with alcohol, sedatives  or other agents which have a similar  adverse risk potential. These risks are greater as we age as there is decreased ability of the liver and kidneys to metabolize and excrete the medication, resulting in   increased blood levels of the active ingredient.

## 2014-05-25 ENCOUNTER — Encounter: Payer: Self-pay | Admitting: *Deleted

## 2014-05-25 ENCOUNTER — Telehealth: Payer: Self-pay | Admitting: Neurology

## 2014-05-25 NOTE — Telephone Encounter (Signed)
I called and left a message for the patient about her sleep study results.  I informed the patient that the study reveal no significant sleep apnea resulting in significant sleep disruption. I will fax a copy of the report to Dr. Gwyndolyn Saxon Hopper's office and mail a copy to the patient.

## 2014-07-10 ENCOUNTER — Other Ambulatory Visit: Payer: Self-pay | Admitting: Internal Medicine

## 2014-07-10 NOTE — Telephone Encounter (Signed)
This is high risk , controlled substance as per Dr Sheria Lang' s list. I Rxed it once ; but will again for a 78 yo patient. If Dr Beacher May , Sleep Specialist to whom I referred her feels this is appropriate, she can Rx it; but Dr Johny Sax may be dinged by CMS as per discussions @ last PCP meeting

## 2014-07-11 ENCOUNTER — Other Ambulatory Visit: Payer: Self-pay | Admitting: Internal Medicine

## 2014-07-13 ENCOUNTER — Other Ambulatory Visit: Payer: Self-pay | Admitting: Geriatric Medicine

## 2014-07-13 ENCOUNTER — Telehealth: Payer: Self-pay | Admitting: Internal Medicine

## 2014-07-13 MED ORDER — TEMAZEPAM 15 MG PO CAPS: ORAL_CAPSULE | ORAL | Status: DC

## 2014-07-13 NOTE — Telephone Encounter (Signed)
Pt came by office to request Rx refill for TEMAZEPAM. Pt is scheduled to see Dr Doug Sou to establish care-[facility] transfer on 07/24/2014 and will be completely out the medication prior to her appt. Please contact pt when request is reviewed.

## 2014-07-17 NOTE — Telephone Encounter (Signed)
Notified pt md approved rx wanting rx fax to [place]...Angela Barber

## 2014-07-24 ENCOUNTER — Ambulatory Visit: Payer: Medicare Other | Admitting: Internal Medicine

## 2014-08-10 ENCOUNTER — Encounter: Payer: Self-pay | Admitting: Internal Medicine

## 2014-08-10 ENCOUNTER — Ambulatory Visit (INDEPENDENT_AMBULATORY_CARE_PROVIDER_SITE_OTHER): Payer: Medicare Other | Admitting: Geriatric Medicine

## 2014-08-10 ENCOUNTER — Ambulatory Visit (INDEPENDENT_AMBULATORY_CARE_PROVIDER_SITE_OTHER): Payer: Medicare Other | Admitting: Internal Medicine

## 2014-08-10 VITALS — BP 112/78 | HR 93 | Temp 98.4°F | Resp 12 | Ht 63.0 in | Wt 151.0 lb

## 2014-08-10 DIAGNOSIS — Z0000 Encounter for general adult medical examination without abnormal findings: Secondary | ICD-10-CM

## 2014-08-10 DIAGNOSIS — Z299 Encounter for prophylactic measures, unspecified: Secondary | ICD-10-CM

## 2014-08-10 DIAGNOSIS — M19012 Primary osteoarthritis, left shoulder: Secondary | ICD-10-CM

## 2014-08-10 DIAGNOSIS — S46002S Unspecified injury of muscle(s) and tendon(s) of the rotator cuff of left shoulder, sequela: Secondary | ICD-10-CM

## 2014-08-10 DIAGNOSIS — F5102 Adjustment insomnia: Secondary | ICD-10-CM

## 2014-08-10 DIAGNOSIS — Z23 Encounter for immunization: Secondary | ICD-10-CM

## 2014-08-10 DIAGNOSIS — Z418 Encounter for other procedures for purposes other than remedying health state: Secondary | ICD-10-CM

## 2014-08-10 DIAGNOSIS — M19112 Post-traumatic osteoarthritis, left shoulder: Secondary | ICD-10-CM

## 2014-08-10 DIAGNOSIS — H9193 Unspecified hearing loss, bilateral: Secondary | ICD-10-CM

## 2014-08-10 DIAGNOSIS — M75102 Unspecified rotator cuff tear or rupture of left shoulder, not specified as traumatic: Secondary | ICD-10-CM

## 2014-08-10 DIAGNOSIS — E785 Hyperlipidemia, unspecified: Secondary | ICD-10-CM

## 2014-08-10 MED ORDER — DICLOFENAC SODIUM 1 % TD GEL: 2.0000 g | Freq: Four times a day (QID) | TRANSDERMAL | Status: DC

## 2014-08-10 NOTE — Progress Notes (Signed)
Pre visit review using our clinic review tool, if applicable. No additional management support is needed unless otherwise documented below in the visit note. 

## 2014-08-10 NOTE — Patient Instructions (Signed)
We have given you the pneumonia shot and a flu shot today. We will check your blood work.  We will see her back in about one year if things are going well. We have sent in the voltaren gel for you to try for your shoulder pain.  If you have any problems or questions please feel free to call our office sooner.

## 2014-08-13 DIAGNOSIS — Z0000 Encounter for general adult medical examination without abnormal findings: Secondary | ICD-10-CM | POA: Insufficient documentation

## 2014-08-13 NOTE — Assessment & Plan Note (Signed)
Patient not currently on any cholesterol medication. Recheck lipid panel in start medication if indicated.

## 2014-08-13 NOTE — Assessment & Plan Note (Signed)
Patient up-to-date on current screening. She has not had the Zostavax. Spoke with her about it today. Given Prevnar shot today. Already had flu shot. Next colonoscopy due 2018. Spoke with her about screening mammograms.

## 2014-08-13 NOTE — Assessment & Plan Note (Signed)
Status post cochlear implant. She is not able to enjoy music, she is able to follow conversations.

## 2014-08-13 NOTE — Assessment & Plan Note (Signed)
Still having some pain in her shoulder. We'll trial voltaren gel.

## 2014-08-13 NOTE — Progress Notes (Signed)
   Subjective:    Patient ID: Angela Barber, female    DOB: 1936/06/24, 78 y.o.   MRN: [id number]  HPI The patient is a 78 year old female comes in today to establish care. She has past medical history of osteoarthritis, hyperlipidemia, depression, decreased hearing. She had a cochlear implant which was partially successful. She is able to follow conversations however is not able to listen to music anymore. This was one of her main [employer] prior to this. She still been struggling to adjust since 2013. She denies any new complaints. She is having some shoulder and knee pain. She lives alone and is able to perform all of her ADLs.  Review of Systems  Constitutional: Negative for fever, activity change, appetite change, fatigue and unexpected weight change.  HENT: Negative.   Eyes: Negative.   Respiratory: Negative for cough, chest tightness, shortness of breath and wheezing.   Cardiovascular: Negative for chest pain, palpitations and leg swelling.  Gastrointestinal: Negative for abdominal pain, diarrhea, constipation and abdominal distention.  Musculoskeletal: Positive for arthralgias. Negative for myalgias and gait problem.  Skin: Negative.   Neurological: Negative for dizziness, weakness, light-headedness and headaches.  Psychiatric/Behavioral: Positive for sleep disturbance and dysphoric mood.      Objective:   Physical Exam  Constitutional: She is oriented to person, place, and time. She appears well-developed and well-nourished.  HENT:  Head: Normocephalic and atraumatic.  Eyes: EOM are normal.  Neck: Normal range of motion.  Cardiovascular: Normal rate and regular rhythm.   Pulmonary/Chest: Effort normal and breath sounds normal. No respiratory distress. She has no wheezes. She has no rales.  Abdominal: Soft. Bowel sounds are normal. She exhibits no distension. There is no tenderness. There is no rebound.  Musculoskeletal: She exhibits tenderness.  Neurological: She is alert and  oriented to person, place, and time. Coordination normal.  Skin: Skin is warm and dry.   Filed Vitals:   08/10/14 1520  BP: 112/78  Pulse: 93  Temp: 98.4 F (36.9 C)  TempSrc: Oral  Resp: 12  Height: 5\' 3"  (1.6 m)  Weight: 151 lb (68.493 kg)  SpO2: 97%      Assessment & Plan:

## 2014-08-13 NOTE — Assessment & Plan Note (Signed)
She was seen and evaluated with sleep study. No evidence for obstructive sleep apnea. She does use temazepam as well as trazodone for sleep. She alternates agents and does not use both agents on the same night.

## 2014-09-25 ENCOUNTER — Encounter: Payer: Self-pay | Admitting: Internal Medicine

## 2014-09-25 ENCOUNTER — Other Ambulatory Visit (INDEPENDENT_AMBULATORY_CARE_PROVIDER_SITE_OTHER): Payer: Medicare Other

## 2014-09-25 ENCOUNTER — Ambulatory Visit (INDEPENDENT_AMBULATORY_CARE_PROVIDER_SITE_OTHER): Payer: Medicare Other | Admitting: Internal Medicine

## 2014-09-25 VITALS — BP 112/58 | HR 96 | Temp 98.3°F | Resp 12 | Ht 63.0 in | Wt 152.4 lb

## 2014-09-25 DIAGNOSIS — E785 Hyperlipidemia, unspecified: Secondary | ICD-10-CM

## 2014-09-25 DIAGNOSIS — Z299 Encounter for prophylactic measures, unspecified: Secondary | ICD-10-CM

## 2014-09-25 DIAGNOSIS — S46002S Unspecified injury of muscle(s) and tendon(s) of the rotator cuff of left shoulder, sequela: Secondary | ICD-10-CM

## 2014-09-25 DIAGNOSIS — M75102 Unspecified rotator cuff tear or rupture of left shoulder, not specified as traumatic: Secondary | ICD-10-CM

## 2014-09-25 DIAGNOSIS — M19112 Post-traumatic osteoarthritis, left shoulder: Secondary | ICD-10-CM

## 2014-09-25 DIAGNOSIS — M19012 Primary osteoarthritis, left shoulder: Secondary | ICD-10-CM

## 2014-09-25 DIAGNOSIS — F5102 Adjustment insomnia: Secondary | ICD-10-CM

## 2014-09-25 DIAGNOSIS — R0683 Snoring: Secondary | ICD-10-CM

## 2014-09-25 DIAGNOSIS — Z0000 Encounter for general adult medical examination without abnormal findings: Secondary | ICD-10-CM

## 2014-09-25 DIAGNOSIS — Z23 Encounter for immunization: Secondary | ICD-10-CM

## 2014-09-25 DIAGNOSIS — Z418 Encounter for other procedures for purposes other than remedying health state: Secondary | ICD-10-CM

## 2014-09-25 LAB — BASIC METABOLIC PANEL WITH GFR
BUN: 14 mg/dL (ref 6–23)
CO2: 29 meq/L (ref 19–32)
Calcium: 9.4 mg/dL (ref 8.4–10.5)
Chloride: 106 meq/L (ref 96–112)
Creatinine, Ser: 0.8 mg/dL (ref 0.4–1.2)
GFR: 76.17 mL/min
Glucose, Bld: 90 mg/dL (ref 70–99)
Potassium: 4.5 meq/L (ref 3.5–5.1)
Sodium: 142 meq/L (ref 135–145)

## 2014-09-25 LAB — LIPID PANEL
Cholesterol: 228 mg/dL — ABNORMAL HIGH (ref 0–200)
HDL: 43.6 mg/dL (ref >39.00)
LDL Cholesterol: 151 mg/dL — ABNORMAL HIGH (ref 0–99)
NonHDL: 184.4
TRIGLYCERIDES: 166 mg/dL — AB (ref 0.0–149.0)
Total CHOL/HDL Ratio: 5
VLDL: 33.2 mg/dL (ref 0.0–40.0)

## 2014-09-25 MED ORDER — TEMAZEPAM 15 MG PO CAPS: ORAL_CAPSULE | ORAL | Status: DC

## 2014-09-25 MED ORDER — TRAZODONE HCL 50 MG PO TABS: 50.0000 mg | ORAL_TABLET | Freq: Every day | ORAL | Status: DC

## 2014-09-25 NOTE — Assessment & Plan Note (Signed)
Check lipid panel. Not on meds right now and trying to do good with diet and exercise.

## 2014-09-25 NOTE — Patient Instructions (Signed)
We have given you the shingles shot today.  We will work on the prior authorization for your voltaren gel. If we are not able to get it approved we will try another medicine. It is safe to take up to 3g (3000mg ) of tylenol per day for pain. I think it is a good idea to take 2 before bedtime.   We would recommend trying chewing on a calcium such as tums when you have the leg cramps at night time. This may be helpful.   Food Choices to Help Relieve Diarrhea When you have diarrhea, the foods you eat and your eating habits are very important. Choosing the right foods and drinks can help relieve diarrhea. Also, because diarrhea can last up to 7 days, you need to replace lost fluids and electrolytes (such as sodium, potassium, and chloride) in order to help prevent dehydration.  WHAT GENERAL GUIDELINES DO I NEED TO FOLLOW?  Slowly drink 1 cup (8 oz) of fluid for each episode of diarrhea. If you are getting enough fluid, your urine will be clear or pale yellow.  Eat starchy foods. Some good choices include white rice, white toast, pasta, low-fiber cereal, baked potatoes (without the skin), saltine crackers, and bagels.  Avoid large servings of any cooked vegetables.  Limit fruit to two servings per day. A serving is  cup or 1 small piece.  Choose foods with less than 2 g of fiber per serving.  Limit fats to less than 8 tsp (38 g) per day.  Avoid fried foods.  Eat foods that have probiotics in them. Probiotics can be found in certain dairy products.  Avoid foods and beverages that may increase the speed at which food moves through the stomach and intestines (gastrointestinal tract). Things to avoid include:  High-fiber foods, such as dried fruit, raw fruits and vegetables, nuts, seeds, and whole grain foods.  Spicy foods and high-fat foods.  Foods and beverages sweetened with high-fructose corn syrup, honey, or sugar alcohols such as xylitol, sorbitol, and mannitol. WHAT FOODS ARE  RECOMMENDED? Grains White rice. White, [place], or pita breads (fresh or toasted), including plain rolls, buns, or bagels. White pasta. Saltine, soda, or graham crackers. Pretzels. Low-fiber cereal. Cooked cereals made with water (such as cornmeal, farina, or cream cereals). Plain muffins. Matzo. Melba toast. Zwieback.  Vegetables Potatoes (without the skin). Strained tomato and vegetable juices. Most well-cooked and canned vegetables without seeds. Tender lettuce. Fruits Cooked or canned applesauce, apricots, cherries, fruit cocktail, grapefruit, peaches, pears, or plums. Fresh bananas, apples without skin, cherries, grapes, cantaloupe, grapefruit, peaches, oranges, or plums.  Meat and Other Protein Products Baked or boiled chicken. Eggs. Tofu. Fish. Seafood. Smooth peanut butter. Ground or well-cooked tender beef, ham, veal, lamb, pork, or poultry.  Dairy Plain yogurt, kefir, and unsweetened liquid yogurt. Lactose-free milk, buttermilk, or soy milk. Plain hard cheese. Beverages Sport drinks. Clear broths. Diluted fruit juices (except prune). Regular, caffeine-free sodas such as ginger ale. Water. Decaffeinated teas. Oral rehydration solutions. Sugar-free beverages not sweetened with sugar alcohols. Other Bouillon, broth, or soups made from recommended foods.  The items listed above may not be a complete list of recommended foods or beverages. Contact your dietitian for more options. WHAT FOODS ARE NOT RECOMMENDED? Grains Whole grain, whole wheat, bran, or rye breads, rolls, pastas, crackers, and cereals. Wild or brown rice. Cereals that contain more than 2 g of fiber per serving. Corn tortillas or taco shells. Cooked or dry oatmeal. Granola. Popcorn. Vegetables Raw vegetables. Cabbage, broccoli,  Brussels sprouts, artichokes, baked beans, beet greens, corn, kale, legumes, peas, sweet potatoes, and yams. Potato skins. Cooked spinach and cabbage. Fruits Dried fruit, including raisins and dates.  Raw fruits. Stewed or dried prunes. Fresh apples with skin, apricots, mangoes, pears, raspberries, and strawberries.  Meat and Other Protein Products Chunky peanut butter. Nuts and seeds. Beans and lentils. Berniece Salines.  Dairy High-fat cheeses. Milk, chocolate milk, and beverages made with milk, such as milk shakes. Cream. Ice cream. Sweets and Desserts Sweet rolls, doughnuts, and sweet breads. Pancakes and waffles. Fats and Oils Butter. Cream sauces. Margarine. Salad oils. Plain salad dressings. Olives. Avocados.  Beverages Caffeinated beverages (such as coffee, tea, soda, or energy drinks). Alcoholic beverages. Fruit juices with pulp. Prune juice. Soft drinks sweetened with high-fructose corn syrup or sugar alcohols. Other Coconut. Hot sauce. Chili powder. Mayonnaise. Gravy. Cream-based or milk-based soups.  The items listed above may not be a complete list of foods and beverages to avoid. Contact your dietitian for more information. WHAT SHOULD I DO IF I BECOME DEHYDRATED? Diarrhea can sometimes lead to dehydration. Signs of dehydration include dark urine and dry mouth and skin. If you think you are dehydrated, you should rehydrate with an oral rehydration solution. These solutions can be purchased at pharmacies, retail stores, or online.  Drink -1 cup (120-240 mL) of oral rehydration solution each time you have an episode of diarrhea. If drinking this amount makes your diarrhea worse, try drinking smaller amounts more often. For example, drink 1-3 tsp (5-15 mL) every 5-10 minutes.  A general rule for staying hydrated is to drink 1-2 L of fluid per day. Talk to your health care provider about the specific amount you should be drinking each day. Drink enough fluids to keep your urine clear or pale yellow. Document Released: 11/29/2003 Document Revised: 09/13/2013 Document Reviewed: 08/01/2013 [facility] Patient Information 2015 [place], [place]. This information is not intended to replace advice given  to you by your health care provider. Make sure you discuss any questions you have with your health care provider.

## 2014-09-25 NOTE — Assessment & Plan Note (Signed)
Will resend in the voltaren gel and work to get approval.

## 2014-09-25 NOTE — Progress Notes (Signed)
Pre visit review using our clinic review tool, if applicable. No additional management support is needed unless otherwise documented below in the visit note. 

## 2014-09-25 NOTE — Progress Notes (Signed)
   Subjective:    Patient ID: Angela Barber, female    DOB: 02-03-1936, 79 y.o.   MRN: [id number]  HPI The patient is a 79 YO female who is coming in for her physical. She is still having occasional diarrhea which she is not able to well link to foods. It is unpredictable and infrequent. She is also having the pain in her shoulder which is worsening. She has been taking tylenol before bedtime to help with the discomfort. Its range of motion is limited and gets stiff and painful when not moving for a short period of time. Her sleeping is okay but not great. She does use her sleeping medications which help quite a lot. She has not been able to try voltaren as it needs a prior authorization.  Review of Systems  Constitutional: Negative for fever, activity change, appetite change, fatigue and unexpected weight change.  HENT: Negative.   Eyes: Negative.   Respiratory: Negative for cough, chest tightness, shortness of breath and wheezing.   Cardiovascular: Negative for chest pain, palpitations and leg swelling.  Gastrointestinal: Positive for diarrhea. Negative for abdominal pain, constipation and abdominal distention.       Rare  Musculoskeletal: Positive for arthralgias. Negative for myalgias and gait problem.  Skin: Negative.   Neurological: Negative for dizziness, weakness, light-headedness and headaches.  Psychiatric/Behavioral: Positive for sleep disturbance and dysphoric mood.      Objective:   Physical Exam  Constitutional: She is oriented to person, place, and time. She appears well-developed and well-nourished.  HENT:  Head: Normocephalic and atraumatic.  Eyes: EOM are normal.  Neck: Normal range of motion.  Cardiovascular: Normal rate and regular rhythm.   Pulmonary/Chest: Effort normal and breath sounds normal. No respiratory distress. She has no wheezes. She has no rales.  Abdominal: Soft. Bowel sounds are normal. She exhibits no distension. There is no tenderness. There is no  rebound.  Musculoskeletal: She exhibits tenderness.  Neurological: She is alert and oriented to person, place, and time. Coordination normal.  Skin: Skin is warm and dry.   Filed Vitals:   09/25/14 1028  BP: 112/58  Pulse: 96  Temp: 98.3 F (36.8 C)  TempSrc: Oral  Resp: 12  Height: 5\' 3"  (1.6 m)  Weight: 152 lb 6.4 oz (69.128 kg)  SpO2: 91%      Assessment & Plan:

## 2015-03-08 ENCOUNTER — Other Ambulatory Visit: Payer: Self-pay | Admitting: *Deleted

## 2015-03-08 DIAGNOSIS — I83811 Varicose veins of right lower extremities with pain: Secondary | ICD-10-CM

## 2015-03-09 ENCOUNTER — Telehealth: Payer: Self-pay

## 2015-03-09 NOTE — Telephone Encounter (Signed)
LVM for pt to call back in regards to scheduling AWV with our health coach.   RE: due in Novemeber 2016

## 2015-03-16 ENCOUNTER — Encounter: Payer: Self-pay | Admitting: Vascular Surgery

## 2015-03-20 ENCOUNTER — Encounter: Payer: Self-pay | Admitting: Vascular Surgery

## 2015-03-20 ENCOUNTER — Ambulatory Visit (INDEPENDENT_AMBULATORY_CARE_PROVIDER_SITE_OTHER): Payer: Medicare Other | Admitting: Vascular Surgery

## 2015-03-20 ENCOUNTER — Ambulatory Visit (HOSPITAL_COMMUNITY)
Admission: RE | Admit: 2015-03-20 | Discharge: 2015-03-20 | Disposition: A | Discharge status: Home / Self Care | Payer: Medicare Other | Source: Ambulatory Visit | Attending: Vascular Surgery | Admitting: Vascular Surgery

## 2015-03-20 VITALS — BP 122/80 | HR 88 | Temp 97.8°F | Resp 16 | Ht 63.0 in | Wt 150.0 lb

## 2015-03-20 DIAGNOSIS — I83811 Varicose veins of right lower extremities with pain: Secondary | ICD-10-CM | POA: Insufficient documentation

## 2015-03-20 DIAGNOSIS — I82811 Embolism and thrombosis of superficial veins of right lower extremities: Secondary | ICD-10-CM | POA: Diagnosis not present

## 2015-03-20 DIAGNOSIS — I872 Venous insufficiency (chronic) (peripheral): Secondary | ICD-10-CM | POA: Diagnosis not present

## 2015-03-20 NOTE — Progress Notes (Signed)
Referred by:  Olga Millers, MD [address] [place], [place] [address]  Reason for referral: leg pain   History of Present Illness  Angela Barber is a 79 y.o. (Jan 29, 1936) female who presents with chief complaint: left pain right greater than left. She is known to Dr. Donnetta Hutching from having prior sclerotherapy to the right leg in 2010. She notes "burning" and pain to her anterior thighs and anterior shins bilaterally as well as severe burning to her right lateral knee area. She stands and "runs around a lot" during the day. This is when she notices most of her symptoms. She denies pain or burning in the morning. She denies any skin changes or ulcers. She has no history of DVT. She denies any swelling. She denies any venous bleeding episodes. She does not like the appearance of her spider veins. She does not wear compression stockings.   She denies any claudication or rest pain symptoms.   Past Medical History  Diagnosis Date  . Insomnia   . C. difficile diarrhea 9/06  . Diverticulosis   . Osteoarthritis, knee   . Depression   . Internal hemorrhoid   . Hyperplastic colon polyp   . HLD (hyperlipidemia)   . Anxiety   . Hearing loss of both ears   . Cochlear implant status     Past Surgical History  Procedure Laterality Date  . Tonsillectomy    . Vaginal hysterectomy    . Cochlear implant  July '13    right ear. Dr. Idelle Crouch  . Bunionectomy      right  . Rotator cuff repair Left 2012  . Rotator cuff repair Right 2007 & 2009    History   Social History  . Marital Status: Widowed    Spouse Name: N/A  . Number of Children: 2  . Years of Education: Master's   Occupational History  . retired    Social History Main Topics  . Smoking status: Former Research scientist (life sciences)  . Smokeless tobacco: Never Used  . Alcohol Use: Yes     Comment: 1 glass of wine a day  . Drug Use: No  . Sexual Activity: Not on file   Other Topics Concern  . Not on file   Social History Narrative   Patient is widowed and lives alone.   Patient has two adult children.   Patient is retired.   Patient has a Scientist, water quality.   Patient is right-handed.   Patient drinks two cups of coffee daily.    Family History  Problem Relation Age of Onset  . Breast cancer Maternal Aunt     aunts  . Stroke Sister   . Ovarian cancer Mother   . Transient ischemic attack Mother   . Anxiety disorder Daughter       Current Outpatient Prescriptions on File Prior to Visit  Medication Sig Dispense Refill  . traZODone (DESYREL) 50 MG tablet Take 1 tablet (50 mg total) by mouth at bedtime. 30 tablet 3  . acetaminophen (TYLENOL) 325 MG tablet Take 650 mg by mouth.    Marland Kitchen aspirin EC 81 MG tablet Take 1 tablet (81 mg total) by mouth daily. (Patient not taking: Reported on 09/25/2014)    . diclofenac sodium (VOLTAREN) 1 % GEL Apply 2 g topically 4 (four) times daily. (Patient not taking: Reported on 09/25/2014) 100 g 6  . temazepam (RESTORIL) 15 MG capsule TAKE 1 CAPSULE AT BEDTIME AS NEEDED FOR SLEEP. (Patient not taking: Reported on 03/20/2015)  30 capsule 3   No current facility-administered medications on file prior to visit.    No Known Allergies   REVIEW OF SYSTEMS:  (Positives checked otherwise negative)  CARDIOVASCULAR:  []  chest pain, []  chest pressure, []  palpitations, []  shortness of breath when laying flat, []  shortness of breath with exertion,  []  pain in feet when walking, []  pain in feet when laying flat, []  history of blood clot in veins (DVT), []  history of phlebitis, []  swelling in legs, [x]  varicose veins  PULMONARY:  []  productive cough, []  asthma, []  wheezing  NEUROLOGIC:  []  weakness in arms or legs, []  numbness in arms or legs, []  difficulty speaking or slurred speech, []  temporary loss of vision in one eye, []  dizziness  HEMATOLOGIC:  []  bleeding problems, []  problems with blood clotting too easily  MUSCULOSKEL:  []  joint pain, []  joint swelling  GASTROINTEST:  []  vomiting blood, []   blood in stool     GENITOURINARY:  []  burning with urination, []  blood in urine  PSYCHIATRIC:  []  history of major depression  INTEGUMENTARY:  []  rashes, []  ulcers  CONSTITUTIONAL:  []  fever, []  chills   Physical Examination Filed Vitals:   03/20/15 1358  BP: 122/80  Pulse: 88  Temp: 97.8 F (36.6 C)  TempSrc: Oral  Resp: 16  Height: 5\' 3"  (1.6 m)  Weight: 150 lb (68.04 kg)  SpO2: 100%   Body mass index is 26.58 kg/(m^2).  General: A&O x 3, WDWN female in NAD  Head: [place]/AT  Neck: Supple, no nuchal rigidity, no palpable LAD  Pulmonary: Sym exp, good air movt, CTAB, no rales, rhonchi, & wheezing  Cardiac: RRR, Nl S1, S2, no Murmurs, rubs or gallops, no carotid bruits  Vascular: Palpable 2+ radial and dorsalis pedis pulses b/l, superficial varicosites anterior shins bilaterally, spider telangectasias diffuse bilaterally, trace pedal edema lower extremities bilaterally  Musculoskeletal:  Extremities without ischemic changes.   Neurologic: CN 2-12 grossly intact, Pain and light touch intact in extremities  Psychiatric: Judgment intact, Mood & affect appropriate for pt's clinical situation  Dermatologic: See M/S exam for extremity exam, no rashes otherwise noted  Non-Invasive Vascular Imaging  BLE Venous Insufficiency Duplex (Date: 03/20/2015):   RLE: negative DVT,  positive GSV reflux: 0.5 cm at knee, 0.52 cm at proximal thigh, 0.77 cm at [facility],  positive deep venous reflux  Medical Decision Making  Angela Barber is a 79 y.o. female who presents with mild right lower extremity venous insufficiency. Suspect that she does have left lower extremity venous insufficiency (formal duplex exam not performed at this visit). She does not exhibit classic venous insufficiency symptoms of aching, heaviness and swelling. She has focal areas of burning (anterior thighs, anterior shins and right lateral thigh/knee area) that happen to correspond with areas of spider telangectasias. She  has a mildly dilated right great saphenous vein. It is unclear whether venous ablation will alleviate her symptoms.  The patient expresses desire for sclerotherapy of her telectangestias in hopes that this will improve her symptoms. Explained that there is chance she may not experience any relief of of her symptoms. She will continue discussion with sclerotherapy RN.   Virgina Jock, PA-C [facility] Office: [phone] Pager: [phone]  03/20/2015, 2:40 PM  This patient was seen and examined in conjunction with Dr. Donnetta Hutching  I have examined the patient, reviewed and agree with above. Patient does have reflux in her right great saphenous vein but her symptoms did not correspond with  this. Explained her that I do not feel that she has any significant benefit from ablation of her great saphenous vein since her symptoms are more above her knee and over these areas of scattered telangiectasia. She is comfortable with this discussion and is currently consider a sclerotherapy of these areas to hopefully improve her symptoms.  Curt Jews, MD 03/20/2015 3:11 PM

## 2015-03-22 ENCOUNTER — Encounter: Payer: Self-pay | Admitting: Internal Medicine

## 2015-04-16 ENCOUNTER — Other Ambulatory Visit: Payer: Self-pay | Admitting: Internal Medicine

## 2015-04-18 ENCOUNTER — Other Ambulatory Visit: Payer: Self-pay | Admitting: Internal Medicine

## 2015-04-19 NOTE — Telephone Encounter (Signed)
Sent to pharmacy 

## 2015-05-04 ENCOUNTER — Encounter: Payer: Self-pay | Admitting: Internal Medicine

## 2015-05-09 ENCOUNTER — Ambulatory Visit (INDEPENDENT_AMBULATORY_CARE_PROVIDER_SITE_OTHER): Payer: Medicare Other | Admitting: Internal Medicine

## 2015-05-09 ENCOUNTER — Encounter: Payer: Self-pay | Admitting: Internal Medicine

## 2015-05-09 ENCOUNTER — Other Ambulatory Visit (INDEPENDENT_AMBULATORY_CARE_PROVIDER_SITE_OTHER): Payer: Medicare Other

## 2015-05-09 VITALS — BP 114/70 | HR 64 | Ht 63.0 in | Wt 151.0 lb

## 2015-05-09 DIAGNOSIS — R109 Unspecified abdominal pain: Secondary | ICD-10-CM

## 2015-05-09 LAB — CBC WITH DIFFERENTIAL/PLATELET
BASOS ABS: 0.1 10*3/uL (ref 0.0–0.1)
Basophils Relative: 0.7 % (ref 0.0–3.0)
EOS ABS: 0.2 10*3/uL (ref 0.0–0.7)
Eosinophils Relative: 3 % (ref 0.0–5.0)
HEMATOCRIT: 44.7 % (ref 36.0–46.0)
Hemoglobin: 15.1 g/dL — ABNORMAL HIGH (ref 12.0–15.0)
LYMPHS PCT: 38.6 % (ref 12.0–46.0)
Lymphs Abs: 2.8 10*3/uL (ref 0.7–4.0)
MCHC: 33.7 g/dL (ref 30.0–36.0)
MCV: 93.1 fl (ref 78.0–100.0)
MONOS PCT: 9 % (ref 3.0–12.0)
Monocytes Absolute: 0.6 10*3/uL (ref 0.1–1.0)
NEUTROS ABS: 3.5 10*3/uL (ref 1.4–7.7)
Neutrophils Relative %: 48.7 % (ref 43.0–77.0)
PLATELETS: 307 10*3/uL (ref 150.0–400.0)
RBC: 4.81 Mil/uL (ref 3.87–5.11)
RDW: 13 % (ref 11.5–15.5)
WBC: 7.1 10*3/uL (ref 4.0–10.5)

## 2015-05-09 LAB — AMYLASE: Amylase: 52 U/L (ref 27–131)

## 2015-05-09 LAB — LIPASE: LIPASE: 29 U/L (ref 11.0–59.0)

## 2015-05-09 LAB — HEPATIC FUNCTION PANEL
ALK PHOS: 55 U/L (ref 39–117)
ALT: 15 U/L (ref 0–35)
AST: 19 U/L (ref 0–37)
Albumin: 4 g/dL (ref 3.5–5.2)
BILIRUBIN DIRECT: 0.1 mg/dL (ref 0.0–0.3)
BILIRUBIN TOTAL: 0.5 mg/dL (ref 0.2–1.2)
TOTAL PROTEIN: 6.5 g/dL (ref 6.0–8.3)

## 2015-05-09 LAB — SEDIMENTATION RATE: SED RATE: 21 mm/h (ref 0–22)

## 2015-05-09 LAB — VITAMIN B12: VITAMIN B 12: 263 pg/mL (ref 211–911)

## 2015-05-09 MED ORDER — HYOSCYAMINE SULFATE 0.125 MG SL SUBL: SUBLINGUAL_TABLET | SUBLINGUAL | Status: DC

## 2015-05-09 NOTE — Patient Instructions (Addendum)
You have been scheduled for an abdominal ultrasound at [facility]iology (1st floor of hospital) on 05/16/2015 at 8:30am. Please arrive 15 minutes prior to your appointment for registration. Make certain not to have anything to eat or drink 6 hours prior to your appointment. Should you need to reschedule your appointment, please contact radiology at [phone]. This test typically takes about 30 minutes to perform.  Go to the basement for labs We have sent in Levsin to your pharmacy Take Align Daily  Dr Doug Sou

## 2015-05-09 NOTE — Progress Notes (Signed)
Angela Barber 09-08-1936 [id number]  Note: This dictation was prepared with Dragon digital system. Any transcriptional errors that result from this procedure are unintentional.   History of Present Illness: This is a 79 year old, patient of Dr. Doug Sou. Last office visit in April 2013 for irritable bowel syndrome with predominant diarrhea. treated with Align  and Metamucil. She is here today for evaluation of an acute episode of lower abdominal pain which occurred one week ago and lasted 3-4 days. She was unable to leave the house because of discomfort  in her abdomen. She denied fever, nausea vomiting or rectal bleeding.She has been fully recovered now for several days and able to eat . Last colonoscopy in March 2008 showed internal hemorrhoids and moderately severe diverticulosis. Prior colonoscopy in 2003 showed hyperplastic polyp which was removed. She has been having 3 up to 4 loose stools a day. Her weight has been going up. She denies excessive stress. Alcohol or any unusual dietary indiscretions.    Past Medical History  Diagnosis Date  . Insomnia   . C. difficile diarrhea 9/06  . Diverticulosis   . Osteoarthritis, knee   . Depression   . Internal hemorrhoid   . Hyperplastic colon polyp   . HLD (hyperlipidemia)   . Anxiety   . Hearing loss of both ears   . Cochlear implant status     Past Surgical History  Procedure Laterality Date  . Tonsillectomy    . Vaginal hysterectomy    . Cochlear implant  July '13    right ear. Dr. Idelle Crouch  . Bunionectomy      right  . Rotator cuff repair Left 2012  . Rotator cuff repair Right 2007 & 2009    No Known Allergies  Family history and social history have been reviewed.  Review of Systems:   Abdominal pain which lasted 3 or 4 days and now has resolved. Frequent stools. No rectal bleeding  The remainder of the 10 point ROS is negative except as outlined in the H&P  Physical Exam: General Appearance Well developed, in no  distress, hard of hearing Eyes  Non icteric  HEENT  Non traumatic, normocephalic  Mouth No lesion, tongue papillated, no cheilosis Neck Supple without adenopathy, thyroid not enlarged, no carotid bruits, no JVD Lungs Clear to auscultation bilaterally COR Normal S1, normal S2, regular rhythm, no murmur, quiet precordium Abdomen soft, nontender, normoactive bowel sounds. No distention. No tympany. Liver edge at costal margin  Rectal soft formed Hemoccult-negative stool Extremities  No pedal edema Skin No lesions Neurological Alert and oriented x 3 Psychological Normal mood and affect  Assessment and Plan:   79 year old white female with the brief episode of severe crampy abdominal pain which was self-limited and subsided about a week ago. Her exam today is unremarkable. Possibilities include  flareup of irritable bowel syndrome, symptomatic diverticulosis, low-grade ischemic colitis. Or gallbladder attack. We will proceed with upper abdominal ultrasound and obtain  amylase, lipase, CBC, hepatic function and sedimentation rate. And B12. She will start probiotics 1 a day and Levsin sublingually 0.125 mg in the morning and when necessary crampy abdominal pain. She will be due for recall colonoscopy in March 2018. If symptoms recur or continue I would recommend CT scan of the abdomen    Delfin Edis 05/09/2015

## 2015-05-16 ENCOUNTER — Ambulatory Visit (HOSPITAL_COMMUNITY): Admission: RE | Admit: 2015-05-16 | Payer: Medicare Other | Source: Ambulatory Visit

## 2015-05-17 ENCOUNTER — Ambulatory Visit (HOSPITAL_COMMUNITY)
Admission: RE | Admit: 2015-05-17 | Discharge: 2015-05-17 | Disposition: A | Discharge status: Home / Self Care | Payer: Medicare Other | Source: Ambulatory Visit | Attending: Internal Medicine | Admitting: Internal Medicine

## 2015-05-17 DIAGNOSIS — N281 Cyst of kidney, acquired: Secondary | ICD-10-CM | POA: Diagnosis not present

## 2015-05-17 DIAGNOSIS — R109 Unspecified abdominal pain: Secondary | ICD-10-CM | POA: Diagnosis present

## 2015-07-18 ENCOUNTER — Ambulatory Visit: Payer: Self-pay | Admitting: *Deleted

## 2015-07-23 ENCOUNTER — Encounter: Payer: Self-pay | Admitting: *Deleted

## 2015-07-25 ENCOUNTER — Ambulatory Visit (INDEPENDENT_AMBULATORY_CARE_PROVIDER_SITE_OTHER): Payer: Medicare Other | Admitting: *Deleted

## 2015-07-25 DIAGNOSIS — I83893 Varicose veins of bilateral lower extremities with other complications: Secondary | ICD-10-CM | POA: Insufficient documentation

## 2015-07-25 NOTE — Progress Notes (Signed)
The patient presents with a combo of varicose veins, reticulars and spiders. She says her legs are heavy feeling, painful throbbing pain at times, swelling in both ankles occasionally, and that the spiders and reticulars itch and burn. She had a reflux study here in June of this year that did document reflux in the R GSV throughout the thigh but not right at the junction. The left leg was not studied. She says her symptoms have worsened since June. I am worried that sclerotherapy will not be successful with the reflux that is present in her right leg, and I have no idea about the left leg since that leg was not studied. I suggested that she return next July for a bilateral reflux study and to see Dr. Donnetta Hutching again. The patient expressed understanding of the plan. (Note: The patient got lost on her way to the office and was 45 minutes late. She also said she paid a $300 copay when she checked in, but she actually did not.)

## 2015-08-30 ENCOUNTER — Ambulatory Visit (INDEPENDENT_AMBULATORY_CARE_PROVIDER_SITE_OTHER): Payer: Medicare Other | Admitting: Family

## 2015-08-30 ENCOUNTER — Ambulatory Visit (INDEPENDENT_AMBULATORY_CARE_PROVIDER_SITE_OTHER)
Admission: RE | Admit: 2015-08-30 | Discharge: 2015-08-30 | Disposition: A | Discharge status: Home / Self Care | Payer: Medicare Other | Source: Ambulatory Visit | Attending: Family | Admitting: Family

## 2015-08-30 ENCOUNTER — Encounter: Payer: Self-pay | Admitting: Family

## 2015-08-30 VITALS — BP 134/76 | HR 107 | Temp 97.9°F | Resp 16 | Ht 63.0 in | Wt 160.0 lb

## 2015-08-30 DIAGNOSIS — M25561 Pain in right knee: Secondary | ICD-10-CM | POA: Insufficient documentation

## 2015-08-30 DIAGNOSIS — M25562 Pain in left knee: Secondary | ICD-10-CM

## 2015-08-30 NOTE — Patient Instructions (Addendum)
Thank you for choosing [facility].  Summary/Instructions:   Please ice your knee 2-3 times per day  Recommend a compression neoprene knee sleeve.   Please stop downstairs in x-ray to rule out a fracture.    Referrals have been made during this visit. You should expect to hear back from our schedulers in about 7-10 days in regards to establishing an appointment with the specialists we discussed.   If your symptoms worsen or fail to improve, please contact our office for further instruction, or in case of emergency go directly to the emergency room at the closest medical facility.   Meniscus Tear With Phase I Rehab The meniscus is a C-shaped cartilage structure, located in the knee joint between the thigh bone (femur) and the shinbone (tibia). Two menisci are located in each knee joint: the inner and outer meniscus. The meniscus acts as an adapter between the thigh bone and shinbone, allowing them to fit properly together. It also functions as a shock absorber, to reduce the stress placed on the knee joint and to help supply nutrients to the knee joint cartilage. As people age, the meniscus begins to harden and become more vulnerable to injury. Meniscus tears are a common injury, especially in older athletes. Inner meniscus tears are more common than outer meniscus tears.  SYMPTOMS   Pain in the knee, especially with standing or squatting with the affected leg.  Tenderness along the joint line.  Swelling in the knee joint (effusion), usually starting 1 to 2 days after injury.  Locking or catching of the knee joint, causing inability to straighten the knee completely.  Giving way or buckling of the knee. CAUSES  A meniscus tear occurs when a force is placed on the meniscus that is greater than it can handle. Common causes of injury include:  Direct hit (trauma) to the knee.  Twisting, pivoting, or cutting (rapidly changing direction while running), kneeling or  squatting.  Without injury, due to aging. RISK INCREASES WITH:  Contact sports (football, rugby).  Sports in which cleats are used with pivoting (soccer, lacrosse) or sports in which good shoe grip and sudden change in direction are required (racquetball, basketball, squash).  Previous knee injury.  Associated knee injury, particularly ligament injuries.  Poor strength and flexibility. PREVENTION  Warm up and stretch properly before activity.  Maintain physical fitness:  Strength, flexibility, and endurance.  Cardiovascular fitness.  Protect the knee with a brace or elastic bandage.  Wear properly fitted protective equipment (proper cleats for the surface). PROGNOSIS  Sometimes, meniscus tears heal on their own. However, definitive treatment requires surgery, followed by at least 6 weeks of recovery.  RELATED COMPLICATIONS   Recurring symptoms that result in a chronic problem.  Repeated knee injury, especially if sports are resumed too soon after injury or surgery.  Progression of the tear (the tear gets larger), if untreated.  Arthritis of the knee in later years (with or without surgery).  Complications of surgery, including infection, bleeding, injury to nerves (numbness, weakness, paralysis) continued pain, giving way, locking, nonhealing of meniscus (if repaired), need for further surgery, and knee stiffness (loss of motion). TREATMENT  Treatment first involves the use of ice and medicine, to reduce pain and inflammation. You may find using crutches to walk more comfortable. However, it is okay to bear weight on the injured knee, if the pain will allow it. Surgery is often advised as a definitive treatment. Surgery is performed through an incision near the joint (arthroscopically). The torn  piece of the meniscus is removed, and if possible the joint cartilage is repaired. After surgery, the joint must be restrained. After restraint, it is important to perform  strengthening and stretching exercises to help regain strength and a full range of motion. These exercises may be completed at home or with a therapist.  MEDICATION  If pain medicine is needed, nonsteroidal anti-inflammatory medicines (aspirin and ibuprofen), or other minor pain relievers (acetaminophen), are often advised.  Do not take pain medicine for 7 days before surgery.  Prescription pain relievers may be given, if your caregiver thinks they are needed. Use only as directed and only as much as you need. HEAT AND COLD  Cold treatment (icing) should be applied for 10 to 15 minutes every 2 to 3 hours for inflammation and pain, and immediately after activity that aggravates your symptoms. Use ice packs or an ice massage.  Heat treatment may be used before performing stretching and strengthening activities prescribed by your caregiver, physical therapist, or athletic trainer. Use a heat pack or a warm water soak. SEEK MEDICAL CARE IF:   Symptoms get worse or do not improve in 2 weeks, despite treatment.  New, unexplained symptoms develop. (Drugs used in treatment may produce side effects.) EXERCISES RANGE OF MOTION (ROM) AND STRETCHING EXERCISES - Meniscus Tear, Non-operative, Phase I These are some of the initial exercises with which you may start your rehabilitation program, until you see your caregiver again or until your symptoms are resolved. Remember:   These initial exercises are intended to be gentle. They will help you restore motion without increasing any swelling.  Completing these exercises allows less painful movement and prepares you for the more aggressive strengthening exercises in Phase II.  An effective stretch should be held for at least 30 seconds.  A stretch should never be painful. You should only feel a gentle lengthening or release in the stretched tissue. RANGE OF MOTION - Knee Flexion, Active  Lie on your back with both knees straight. (If this causes back  discomfort, bend your healthy knee, placing your foot flat on the floor.)  Slowly slide your heel back toward your buttocks until you feel a gentle stretch in the front of your knee or thigh.  Hold for __________ seconds. Slowly slide your heel back to the starting position. Repeat __________ times. Complete this exercise __________ times per day.  RANGE OF MOTION - Knee Flexion and Extension, Active-Assisted  Sit on the edge of a table or chair with your thighs firmly supported. It may be helpful to place a folded towel under the end of your right / left thigh.  Flexion (bending): Place the ankle of your healthy leg on top of the other ankle. Use your healthy leg to gently bend your right / left knee until you feel a mild tension across the top of your knee.  Hold for __________ seconds.  Extension (straightening): Switch your ankles so your right / left leg is on top. Use your healthy leg to straighten your right / left knee until you feel a mild tension on the backside of your knee.  Hold for __________ seconds. Repeat __________ times. Complete __________ times per day. STRETCH - Knee Flexion, Supine  Lie on the floor with your right / left heel and foot lightly touching the wall. (Place both feet on the wall if you do not use a door frame.)  Without using any effort, allow gravity to slide your foot down the wall slowly until you feel  a gentle stretch in the front of your right / left knee.  Hold this stretch for __________ seconds. Then return the leg to the starting position, using your healthy leg for help, if needed. Repeat __________ times. Complete this stretch __________ times per day.  STRETCH - Knee Extension Sitting  Sit with your right / left leg/heel propped on another chair, coffee table, or foot stool.  Allow your leg muscles to relax, letting gravity straighten out your knee.*  You should feel a stretch behind your right / left knee. Hold this position for  __________ seconds. Repeat __________ times. Complete this stretch __________ times per day.  *Your physician, physical therapist or athletic trainer may instruct you place a __________ weight on your thigh, just above your kneecap, to deepen the stretch.  STRENGTHENING EXERCISES - Meniscus Tear, Non-operative, Phase I These exercises may help you when beginning to rehabilitate your injury. They may resolve your symptoms with or without further involvement from your physician, physical therapist or athletic trainer. While completing these exercises, remember:   Muscles can gain both the endurance and the strength needed for everyday activities through controlled exercises.  Complete these exercises as instructed by your physician, physical therapist or athletic trainer. Progress the resistance and repetitions only as guided. STRENGTH - Quadriceps, Isometrics  Lie on your back with your right / left leg extended and your opposite knee bent.  Gradually tense the muscles in the front of your right / left thigh. You should see either your knee cap slide up toward your hip or increased dimpling just above the knee. This motion will push the back of the knee down toward the floor, mat, or bed on which you are lying.  Hold the muscle as tight as you can, without increasing your pain, for __________ seconds.  Relax the muscles slowly and completely between each repetition. Repeat __________ times. Complete this exercise __________ times per day.  STRENGTH - Quadriceps, Short Arcs   Lie on your back. Place a __________ inch towel roll under your right / left knee, so that the knee bends slightly.  Raise only your lower leg by tightening the muscles in the front of your thigh. Do not allow your thigh to rise.  Hold this position for __________ seconds. Repeat __________ times. Complete this exercise __________ times per day.  OPTIONAL ANKLE WEIGHTS: Begin with ____________________, but DO NOT exceed  ____________________. Increase in 1 pound/0.5 kilogram increments. STRENGTH - Quadriceps, Straight Leg Raises  Quality counts! Watch for signs that the quadriceps muscle is working, to be sure you are strengthening the correct muscles and not "cheating" by substituting with healthier muscles.  Lay on your back with your right / left leg extended and your opposite knee bent.  Tense the muscles in the front of your right / left thigh. You should see either your knee cap slide up or increased dimpling just above the knee. Your thigh may even shake a bit.  Tighten these muscles even more and raise your leg 4 to 6 inches off the floor. Hold for __________ seconds.  Keeping these muscles tense, lower your leg.  Relax the muscles slowly and completely in between each repetition. Repeat __________ times. Complete this exercise __________ times per day.  STRENGTH - Hamstring, Curls   Lay on your stomach with your legs extended. (If you lay on a bed, your feet may hang over the edge.)  Tighten the muscles in the back of your thigh to bend your right /  left knee up to 90 degrees. Keep your hips flat on the bed.  Hold this position for __________ seconds.  Slowly lower your leg back to the starting position. Repeat __________ times. Complete this exercise __________ times per day.  STRENGTH - Quadriceps, Squats  Stand in a door frame so that your feet and knees are in line with the frame.  Use your hands for balance, not support, on the frame.  Slowly lower your weight, bending at the hips and knees. Keep your lower legs upright so that they are parallel with the door frame. Squat only within the range that does not increase your knee pain. Never let your hips drop below your knees.  Slowly return upright, pushing with your legs, not pulling with your hands. Repeat __________ times. Complete this exercise __________ times per day.  STRENGTH - Quad/VMO, Isometric   Sit in a chair with your  right / left knee slightly bent. With your fingertips, feel the VMO muscle just above the inside of your knee. The VMO is important in controlling the position of your kneecap.  Keeping your fingertips on this muscle. Without actually moving your leg, attempt to drive your knee down as if straightening your leg. You should feel your VMO tense. If you have a difficult time, you may wish to try the same exercise on your healthy knee first.  Tense this muscle as hard as you can without increasing any knee pain.  Hold for __________ seconds. Relax the muscles slowly and completely in between each repetition. Repeat __________ times. Complete exercise __________ times per day.    This information is not intended to replace advice given to you by your health care provider. Make sure you discuss any questions you have with your health care provider.   Document Released: 09/22/1998 Document Revised: 01/23/2015 Document Reviewed: 12/21/2008 Elsevier Interactive Patient Education [employer].

## 2015-08-30 NOTE — Progress Notes (Signed)
Subjective:    Patient ID: Angela Barber, female    DOB: 1936-08-29, 79 y.o.   MRN: [id number]  Chief Complaint  Patient presents with  . Leg Pain    Left leg feels like the back of her knee is swollen, stepped off a curb tuesday night and her knee buckled and she had severe pain    HPI:  Angela Barber is a 79 y.o. female who  has a past medical history of Insomnia; C. difficile diarrhea (9/06); Diverticulosis; Osteoarthritis, knee; Depression; Internal hemorrhoid; Hyperplastic colon polyp; HLD (hyperlipidemia); Anxiety; Hearing loss of both ears; and Cochlear implant status. and presents today for an acute office visit.  This is a new problem. Associated symptom of pain located in her left knee has been going on for about for several days gradually worsening and eventually aggravated following stepping off of a curb after her knee buckled. Started with a pain in the back of her knee. Pain is described as sharp. Denies any sounds/sensations that are heard or felt. Denies any knee locking. Modifying factors include a heating pad and Aleve which helped a little with her pain. Sitting does not hurt, but weight bearing aggravates it. Denies any previous trauma to either knee. She is not able to fully flex her knee.  No Known Allergies   No current outpatient prescriptions on file prior to visit.   No current facility-administered medications on file prior to visit.     Past Surgical History  Procedure Laterality Date  . Tonsillectomy    . Vaginal hysterectomy    . Cochlear implant  July '13    right ear. Dr. Idelle Crouch  . Bunionectomy      right  . Rotator cuff repair Left 2012  . Rotator cuff repair Right 2007 & 2009   Past Medical History  Diagnosis Date  . Insomnia   . C. difficile diarrhea 9/06  . Diverticulosis   . Osteoarthritis, knee   . Depression   . Internal hemorrhoid   . Hyperplastic colon polyp   . HLD (hyperlipidemia)   . Anxiety   . Hearing loss of both  ears   . Cochlear implant status      Review of Systems  Constitutional: Negative for fever and chills.  Musculoskeletal:       Positive for left knee pain      Objective:    BP 134/76 mmHg  Pulse 107  Temp(Src) 97.9 F (36.6 C) (Oral)  Resp 16  Ht 5\' 3"  (1.6 m)  Wt 160 lb (72.576 kg)  BMI 28.35 kg/m2  SpO2 93% Nursing note and vital signs reviewed.  Physical Exam  Constitutional: She is oriented to person, place, and time. She appears well-developed and well-nourished. No distress.  Cardiovascular: Normal rate, regular rhythm, normal heart sounds and intact distal pulses.   Pulmonary/Chest: Effort normal and breath sounds normal.  Musculoskeletal:  Left knee - no obvious deformity or discoloration. Mild edema. No palpable tenderness able to be elicited. Full active range of motion in extension. Slightly limited in flexion. Strength is 4+. Distal pulses are intact and appropriate. Negative anterior drawer, negative valgus/varus, pain with McMurray's near full flexion. Pain with weightbearing.  Neurological: She is alert and oriented to person, place, and time.  Skin: Skin is warm and dry.  Psychiatric: She has a normal mood and affect. Her behavior is normal. Judgment and thought content normal.       Assessment & Plan:   Problem List Items  Addressed This Visit      Other   Left knee pain - Primary    Left knee pain of questionable origin related to osteoarthritis or potential meniscal tear. Obtain knee x-rays to rule out fracture. Continue over-the-counter anti-inflammatories as needed. Start home exercise therapy, compression, and ice as needed. Refer to orthopedics for further evaluation. Follow-up if symptoms worsen or fail to improve.      Relevant Orders   AMB referral to orthopedics   DG Knee Complete 4 Views Left

## 2015-08-30 NOTE — Assessment & Plan Note (Signed)
Left knee pain of questionable origin related to osteoarthritis or potential meniscal tear. Obtain knee x-rays to rule out fracture. Continue over-the-counter anti-inflammatories as needed. Start home exercise therapy, compression, and ice as needed. Refer to orthopedics for further evaluation. Follow-up if symptoms worsen or fail to improve.

## 2015-08-30 NOTE — Progress Notes (Signed)
Pre visit review using our clinic review tool, if applicable. No additional management support is needed unless otherwise documented below in the visit note. 

## 2015-09-01 ENCOUNTER — Encounter: Payer: Self-pay | Admitting: Family

## 2015-09-03 ENCOUNTER — Encounter: Payer: Self-pay | Admitting: Internal Medicine

## 2015-09-03 DIAGNOSIS — H9193 Unspecified hearing loss, bilateral: Secondary | ICD-10-CM

## 2015-09-07 ENCOUNTER — Encounter: Payer: Self-pay | Admitting: Internal Medicine

## 2015-11-16 ENCOUNTER — Ambulatory Visit (INDEPENDENT_AMBULATORY_CARE_PROVIDER_SITE_OTHER): Payer: Medicare Other | Admitting: Nurse Practitioner

## 2015-11-16 ENCOUNTER — Encounter: Payer: Self-pay | Admitting: Nurse Practitioner

## 2015-11-16 VITALS — BP 128/90 | HR 91 | Wt 150.0 lb

## 2015-11-16 DIAGNOSIS — M25562 Pain in left knee: Secondary | ICD-10-CM

## 2015-11-16 MED ORDER — MELOXICAM 7.5 MG PO TABS: 7.5000 mg | ORAL_TABLET | Freq: Every day | ORAL | Status: DC

## 2015-11-16 NOTE — Progress Notes (Signed)
Patient ID: Angela Barber, female    DOB: 31-Dec-1935  Age: 80 y.o. MRN: [id number]  CC: No chief complaint on file.   HPI Angela Barber presents for Knee pain of left knee since Thanksgiving.   1) Saw Greg on 08/30/15 for left knee pain. Pain was in the back of her knee, but now has changed over the last week to the medial side of knee and superior with "soreness"  She reports sleeping in the fetal position and has a hard time moving her leg full to extension from flexion without pain and difficulty- Not new  Sharpness is felt intermittently- same as Dec. Denies trauma since seeing him then.   She has been seen by Dr. French Ana. She is upset because she reports they won't do an MRI due to cochlear implants. She saw those specialists in [facility] and they stated it had to be a certain way or a certain machine. She reports she is not going back for another visit.   Left knee x-ray- normal without abnormalities and some tricompartmental degenerative changes that are mild.   History Angela Barber has a past medical history of Insomnia; C. difficile diarrhea (9/06); Diverticulosis; Osteoarthritis, knee; Depression; Internal hemorrhoid; Hyperplastic colon polyp; HLD (hyperlipidemia); Anxiety; Hearing loss of both ears; and Cochlear implant status.   She has past surgical history that includes Tonsillectomy; Vaginal hysterectomy; Cochlear implant (July '13); Bunionectomy; Rotator cuff repair (Left, 2012); and Rotator cuff repair (Right, 2007 & 2009).   Her family history includes Anxiety disorder in her daughter; Breast cancer in her maternal aunt; Ovarian cancer in her mother; Stroke in her sister; Transient ischemic attack in her mother.She reports that she has quit smoking. She has never used smokeless tobacco. She reports that she drinks alcohol. She reports that she does not use illicit drugs.  No outpatient prescriptions prior to visit.   No facility-administered medications prior to visit.     ROS Review of Systems  Constitutional: Negative for fever, chills, diaphoresis and fatigue.  Musculoskeletal: Positive for joint swelling, arthralgias and gait problem. Negative for myalgias, back pain, neck pain and neck stiffness.       Left knee pain  Skin: Negative for color change, pallor, rash and wound.    Objective:  BP 128/90 mmHg  Pulse 91  Wt 150 lb (68.04 kg)  SpO2 97%  Physical Exam  Constitutional: She is oriented to person, place, and time. She appears well-developed and well-nourished. No distress.  HENT:  Head: Normocephalic and atraumatic.  Right Ear: External ear normal.  Left Ear: External ear normal.  Cardiovascular: Normal rate, regular rhythm and normal heart sounds.  Exam reveals no gallop and no friction rub.   No murmur heard. Pulmonary/Chest: Effort normal and breath sounds normal. No respiratory distress. She has no wheezes. She has no rales. She exhibits no tenderness.  Musculoskeletal: Normal range of motion. She exhibits tenderness. She exhibits no edema.       Legs: Mild tenderness to palpation at the red lines  Swelling is equal bilaterally and is mostly degenerative changes.  There are no visible deformities, no warmth or erythema of bilateral knees. Mild crepitus (worse on right than left). Full ROM with some reluctance by the patient   Neurological: She is alert and oriented to person, place, and time. No cranial nerve deficit. She exhibits normal muscle tone. Coordination abnormal.  Difficulty getting onto table, but stable once there and down on floor  Skin: Skin is warm and dry. No rash  noted. She is not diaphoretic.  Psychiatric: She has a normal mood and affect. Her behavior is normal. Judgment and thought content normal.   Assessment & Plan:   Diagnoses and all orders for this visit:  Left knee pain  Other orders -     meloxicam (MOBIC) 7.5 MG tablet; Take 1 tablet (7.5 mg total) by mouth daily.   I am having Angela Barber start on  meloxicam.  Meds ordered this encounter  Medications  . meloxicam (MOBIC) 7.5 MG tablet    Sig: Take 1 tablet (7.5 mg total) by mouth daily.    Dispense:  30 tablet    Refill:  0    Order Specific Question:  Supervising Provider    Answer:  Crecencio Mc [[id number]]     Follow-up: Return if symptoms worsen or fail to improve.

## 2015-11-16 NOTE — Progress Notes (Signed)
Pre visit review using our clinic review tool, if applicable. No additional management support is needed unless otherwise documented below in the visit note. 

## 2015-11-16 NOTE — Assessment & Plan Note (Signed)
Left knee pain worsening subjectively.   Tylenol is patient's only treatment right now   X-ray was normal and pt is upset that an MRI has not been done. Pt has been seen by Ortho for a consult. Asked her to use ice and mobic once daily with food (no other NSAIDS was re-iterated) and as short a time as possible.  FU prn worsening/failure to improve.

## 2015-11-16 NOTE — Patient Instructions (Addendum)
Meloxicam was sent to your pharmacy.   This is an anti-inflammatory. Take once a day with food on your stomach and you can use with Tylenol (not other Aleve/Advil/Ibuprofen/Naproxen products).   Follow up with your primary care doctor to follow this.

## 2016-03-20 ENCOUNTER — Emergency Department (HOSPITAL_COMMUNITY): Payer: Medicare Other

## 2016-03-20 ENCOUNTER — Observation Stay (HOSPITAL_COMMUNITY)
Admission: EM | Admit: 2016-03-20 | Discharge: 2016-03-20 | Disposition: A | Discharge status: Home / Self Care | Payer: Medicare Other | Attending: Internal Medicine | Admitting: Internal Medicine

## 2016-03-20 ENCOUNTER — Observation Stay (HOSPITAL_BASED_OUTPATIENT_CLINIC_OR_DEPARTMENT_OTHER): Payer: Medicare Other

## 2016-03-20 ENCOUNTER — Other Ambulatory Visit: Payer: Self-pay | Admitting: Physician Assistant

## 2016-03-20 ENCOUNTER — Encounter (HOSPITAL_COMMUNITY): Payer: Self-pay | Admitting: *Deleted

## 2016-03-20 DIAGNOSIS — R079 Chest pain, unspecified: Secondary | ICD-10-CM

## 2016-03-20 DIAGNOSIS — G8929 Other chronic pain: Secondary | ICD-10-CM | POA: Insufficient documentation

## 2016-03-20 DIAGNOSIS — H9193 Unspecified hearing loss, bilateral: Secondary | ICD-10-CM | POA: Diagnosis not present

## 2016-03-20 DIAGNOSIS — Z87891 Personal history of nicotine dependence: Secondary | ICD-10-CM | POA: Diagnosis not present

## 2016-03-20 DIAGNOSIS — H9190 Unspecified hearing loss, unspecified ear: Secondary | ICD-10-CM

## 2016-03-20 DIAGNOSIS — M25562 Pain in left knee: Secondary | ICD-10-CM | POA: Insufficient documentation

## 2016-03-20 DIAGNOSIS — M25561 Pain in right knee: Secondary | ICD-10-CM | POA: Diagnosis present

## 2016-03-20 DIAGNOSIS — Z79899 Other long term (current) drug therapy: Secondary | ICD-10-CM | POA: Diagnosis not present

## 2016-03-20 DIAGNOSIS — E785 Hyperlipidemia, unspecified: Secondary | ICD-10-CM | POA: Diagnosis present

## 2016-03-20 DIAGNOSIS — R072 Precordial pain: Secondary | ICD-10-CM

## 2016-03-20 DIAGNOSIS — G4700 Insomnia, unspecified: Secondary | ICD-10-CM | POA: Diagnosis present

## 2016-03-20 LAB — ECHOCARDIOGRAM COMPLETE
CHL CUP DOP CALC LVOT VTI: 20.3 cm
CHL CUP MV DEC (S): 225
CHL CUP TV REG PEAK VELOCITY: 221 cm/s
E decel time: 225 msec
E/e' ratio: 12.49
FS: 25 % — AB (ref 28–44)
Height: 63 in
IVS/LV PW RATIO, ED: 1.28
LA ID, A-P, ES: 22 mm
LA diam index: 1.24 cm/m2
LA vol A4C: 49.8 ml
LA vol index: 23.5 mL/m2
LA vol: 41.6 mL
LDCA: 2.27 cm2
LEFT ATRIUM END SYS DIAM: 22 mm
LV E/e' medial: 12.49
LV E/e'average: 12.49
LV PW d: 10.6 mm — AB (ref 0.6–1.1)
LV TDI E'LATERAL: 7.18
LVELAT: 7.18 cm/s
LVOTD: 17 mm
LVOTPV: 82.8 cm/s
LVOTSV: 46 mL
MV pk A vel: 87.8 m/s
MV pk E vel: 89.7 m/s
MVPG: 3 mmHg
RV LATERAL S' VELOCITY: 11.5 cm/s
RV sys press: 23 mmHg
TDI e' medial: 5.98
TR max vel: 221 cm/s
Weight: 2440.93 oz

## 2016-03-20 LAB — PROTIME-INR
INR: 1.18 (ref 0.00–1.49)
PROTHROMBIN TIME: 14.8 s (ref 11.6–15.2)

## 2016-03-20 LAB — BASIC METABOLIC PANEL
Anion gap: 8 (ref 5–15)
BUN: 19 mg/dL (ref 6–20)
CALCIUM: 9.9 mg/dL (ref 8.9–10.3)
CO2: 29 mmol/L (ref 22–32)
CREATININE: 0.7 mg/dL (ref 0.44–1.00)
Chloride: 102 mmol/L (ref 101–111)
Glucose, Bld: 118 mg/dL — ABNORMAL HIGH (ref 65–99)
Potassium: 3.8 mmol/L (ref 3.5–5.1)
SODIUM: 139 mmol/L (ref 135–145)

## 2016-03-20 LAB — CBC
HCT: 47.7 % — ABNORMAL HIGH (ref 36.0–46.0)
HEMOGLOBIN: 16.6 g/dL — AB (ref 12.0–15.0)
MCH: 31.4 pg (ref 26.0–34.0)
MCHC: 34.8 g/dL (ref 30.0–36.0)
MCV: 90.3 fL (ref 78.0–100.0)
PLATELETS: 291 10*3/uL (ref 150–400)
RBC: 5.28 MIL/uL — AB (ref 3.87–5.11)
RDW: 13.2 % (ref 11.5–15.5)
WBC: 12.3 10*3/uL — AB (ref 4.0–10.5)

## 2016-03-20 LAB — I-STAT TROPONIN, ED: TROPONIN I, POC: 0.01 ng/mL (ref 0.00–0.08)

## 2016-03-20 LAB — TROPONIN I: Troponin I: <0.03 ng/mL (ref <0.03)

## 2016-03-20 LAB — BRAIN NATRIURETIC PEPTIDE: B NATRIURETIC PEPTIDE 5: 60.1 pg/mL (ref 0.0–100.0)

## 2016-03-20 LAB — APTT: aPTT: 30 seconds (ref 24–37)

## 2016-03-20 LAB — D-DIMER, QUANTITATIVE: D-Dimer, Quant: 0.35 ug/mL-FEU (ref 0.00–0.50)

## 2016-03-20 LAB — LIPASE, BLOOD: LIPASE: 27 U/L (ref 11–51)

## 2016-03-20 MED ORDER — DIPHENHYDRAMINE HCL 25 MG PO CAPS: 25.0000 mg | ORAL_CAPSULE | Freq: Every evening | ORAL | Status: DC | PRN

## 2016-03-20 MED ORDER — ATORVASTATIN CALCIUM 40 MG PO TABS: 40.0000 mg | ORAL_TABLET | Freq: Every day | ORAL | Status: DC

## 2016-03-20 MED ORDER — ONDANSETRON HCL 4 MG/2ML IJ SOLN: 4.0000 mg | Freq: Three times a day (TID) | INTRAMUSCULAR | Status: DC | PRN

## 2016-03-20 MED ORDER — ACETAMINOPHEN 500 MG PO TABS: 500.0000 mg | ORAL_TABLET | Freq: Four times a day (QID) | ORAL | Status: DC | PRN

## 2016-03-20 MED ORDER — MORPHINE SULFATE (PF) 2 MG/ML IV SOLN: 2.0000 mg | INTRAVENOUS | Status: DC | PRN

## 2016-03-20 MED ORDER — NITROGLYCERIN 0.4 MG SL SUBL: 0.4000 mg | SUBLINGUAL_TABLET | SUBLINGUAL | Status: DC | PRN

## 2016-03-20 MED ORDER — ASPIRIN 81 MG PO CHEW: 324.0000 mg | CHEWABLE_TABLET | Freq: Once | ORAL | Status: DC

## 2016-03-20 MED ORDER — HEPARIN SODIUM (PORCINE) 5000 UNIT/ML IJ SOLN
5000.0000 [IU] | Freq: Three times a day (TID) | INTRAMUSCULAR | Status: DC
  Administered 2016-03-20: 5000 [IU] via SUBCUTANEOUS
  Filled 2016-03-20: qty 1

## 2016-03-20 MED ORDER — ACETAMINOPHEN 500 MG PO TABS: 500.0000 mg | ORAL_TABLET | Freq: Every evening | ORAL | Status: DC | PRN

## 2016-03-20 MED ORDER — ALPRAZOLAM 0.25 MG PO TABS: 0.2500 mg | ORAL_TABLET | Freq: Two times a day (BID) | ORAL | Status: DC | PRN

## 2016-03-20 MED ORDER — ASPIRIN 81 MG PO CHEW
324.0000 mg | CHEWABLE_TABLET | Freq: Once | ORAL | Status: AC
  Administered 2016-03-20: 324 mg via ORAL
  Filled 2016-03-20: qty 4

## 2016-03-20 MED ORDER — DIPHENHYDRAMINE-APAP (SLEEP) 25-500 MG PO TABS: 1.0000 | ORAL_TABLET | Freq: Every evening | ORAL | Status: DC | PRN

## 2016-03-20 MED ORDER — NITROGLYCERIN 0.4 MG SL SUBL
0.4000 mg | SUBLINGUAL_TABLET | SUBLINGUAL | Status: DC | PRN
  Administered 2016-03-20 (×2): 0.4 mg via SUBLINGUAL
  Filled 2016-03-20: qty 1

## 2016-03-20 MED ORDER — SODIUM CHLORIDE 0.9 % IV SOLN: INTRAVENOUS | Status: DC

## 2016-03-20 MED ORDER — RISAQUAD PO CAPS
1.0000 | ORAL_CAPSULE | Freq: Every day | ORAL | Status: DC
  Administered 2016-03-20: 1 via ORAL
  Filled 2016-03-20: qty 1

## 2016-03-20 MED ORDER — SODIUM CHLORIDE 0.9 % IV BOLUS (SEPSIS)
250.0000 mL | Freq: Once | INTRAVENOUS | Status: AC
  Administered 2016-03-20: 250 mL via INTRAVENOUS

## 2016-03-20 NOTE — ED Notes (Signed)
Pt states that she began having chest pain / pressure last night; pt states that she felt like she could not get comfortable and the pain got worse; pt c/o shortness of breath, nausea and vomiting; pt states "I feels like an elephant was on my chest"; pt states that the pain has improved somewhat in the last 30 min but it still there; pt states it feels like her chest is being constricted

## 2016-03-20 NOTE — ED Notes (Signed)
MD at bedside. 

## 2016-03-20 NOTE — Consult Note (Signed)
CARDIOLOGY CONSULT NOTE   Patient ID: Angela Barber MRN: [id number] DOB/AGE: 78-Jul-1937 78 y.o.  Admit date: 03/20/2016  Primary Physician   Hoyt Koch, MD Primary Cardiologist   New (Previously seen by Dr. Mare Ferrari 2014) Reason for Consultation   Chest pain  Requesting Physician  Dr. Candiss Norse  HPI: Angela Barber is a 80 y.o. female with a history of HTN , Hyperlipidemia, C. difficile colitis, hearing loss s/p cochlear implant who presented with chest pain.   Previously seen by Dr. Mare Ferrari once 08/31/13 for evaluation of substernal chest pain. Follow-up nuclear stress test did not show any evidence of ischemia. Left ventricular function was normal.  She was in usual state of health up until last night. Around 10:30 PM she started to having mid chest pressure. It was associated with nausea and dry he is followed by episode of vomiting. Denies dyspnea at that time but had difficulty breathing. No radiation of pain. The patient denies  fever, chills, palpitations, shortness of breath, orthopnea, PND, dizziness, syncope, cough, congestion, abdominal pain, hematochezia, melena, lower extremity edema. Her pain resolved after SL nitro x 2 in ED. The past few weeks patient has noted exertional dyspnea but no chest pain. Symptoms getting worse.  EKG shows sinus rhythm at rate of 74 bpm. No acute ST or T wave abnormality. Troponin x 2 negative. CXR clear. Lipase normal. Chest pain free. Pending lipid panel and A1c.   Past Medical History  Diagnosis Date  . Insomnia   . C. difficile diarrhea 9/06  . Diverticulosis   . Osteoarthritis, knee   . Depression   . Internal hemorrhoid   . Hyperplastic colon polyp   . HLD (hyperlipidemia)   . Anxiety   . Hearing loss of both ears   . Cochlear implant status      Past Surgical History  Procedure Laterality Date  . Tonsillectomy    . Vaginal hysterectomy    . Cochlear implant  July '13    right ear. Dr. Idelle Crouch  . Bunionectomy        right  . Rotator cuff repair Left 2012  . Rotator cuff repair Right 2007 & 2009    No Known Allergies  I have reviewed the patient's current medications . acidophilus  1 capsule Oral Daily  . [START ON 03/21/2016] aspirin  324 mg Oral Once  . atorvastatin  40 mg Oral q1800  . heparin  5,000 Units Subcutaneous Q8H   . sodium chloride     acetaminophen, diphenhydrAMINE **AND** acetaminophen, ALPRAZolam, morphine injection, nitroGLYCERIN, ondansetron  Prior to Admission medications   Medication Sig Start Date End Date Taking? Authorizing Provider  acetaminophen (TYLENOL) 500 MG tablet Take 500 mg by mouth every 6 (six) hours as needed for mild pain.   Yes Historical Provider, MD  bifidobacterium infantis (ALIGN) capsule Take 1 capsule by mouth daily.   Yes Historical Provider, MD  diphenhydramine-acetaminophen (TYLENOL PM) 25-500 MG TABS tablet Take 1 tablet by mouth at bedtime as needed (sleep).   Yes Historical Provider, MD     Social History   Social History  . Marital Status: Widowed    Spouse Name: N/A  . Number of Children: 2  . Years of Education: Master's   Occupational History  . retired    Social History Main Topics  . Smoking status: Former Research scientist (life sciences)  . Smokeless tobacco: Never Used  . Alcohol Use: Yes     Comment: 1 glass of wine a day  .  Drug Use: No  . Sexual Activity: Not on file   Other Topics Concern  . Not on file   Social History Narrative   Patient is widowed and lives alone.   Patient has two adult children.   Patient is retired.   Patient has a Scientist, water quality.   Patient is right-handed.   Patient drinks two cups of coffee daily.    Family Status  Relation Status Death Age  . Father Deceased 75  . Mother Deceased 40  . Brother Alive   . Sister Alive   . Sister Alive    Family History  Problem Relation Age of Onset  . Breast cancer Maternal Aunt     aunts  . Stroke Sister   . Ovarian cancer Mother   . Transient ischemic attack  Mother   . Anxiety disorder Daughter      ROS:  Full 14 point review of systems complete and found to be negative unless listed above.  Physical Exam: Blood pressure 86/62, pulse 76, temperature 98.1 F (36.7 C), temperature source Oral, resp. rate 16, height 5\' 3"  (1.6 m), weight 152 lb 8.9 oz (69.2 kg), SpO2 99 %.  General: Well developed, well nourished, female in no acute distress Head: Eyes PERRLA, No xanthomas. Normocephalic and atraumatic, oropharynx without edema or exudate.  Lungs: Resp regular and unlabored, CTA. Faint bibasilar rales.  Heart: RRR no s3, s4, or murmurs. Neck: No carotid bruits. No lymphadenopathy.  No JVD. Abdomen: Bowel sounds present, abdomen soft and non-tender without masses or hernias noted. Msk:  No spine or cva tenderness. No weakness, no joint deformities or effusions. Extremities: No clubbing, cyanosis or edema. DP/PT/Radials 2+ and equal bilaterally. Neuro: Alert and oriented X 3. No focal deficits noted. Psych:  Good affect, responds appropriately Skin: No rashes or lesions noted.  Labs:   Lab Results  Component Value Date   WBC 12.3* 03/20/2016   HGB 16.6* 03/20/2016   HCT 47.7* 03/20/2016   MCV 90.3 03/20/2016   PLT 291 03/20/2016    Recent Labs  03/20/16 0619  INR 1.18    Recent Labs Lab 03/20/16 0331  NA 139  K 3.8  CL 102  CO2 29  BUN 19  CREATININE 0.70  CALCIUM 9.9  GLUCOSE 118*   No results found for: MG  Recent Labs  03/20/16 0619 03/20/16 0831  TROPONINI <0.03 <0.03    Recent Labs  03/20/16 0402  TROPIPOC 0.01   No results found for: PROBNP Lab Results  Component Value Date   CHOL 228* 09/25/2014   HDL 43.60 09/25/2014   LDLCALC 151* 09/25/2014   TRIG 166.0* 09/25/2014   Lab Results  Component Value Date   DDIMER 0.35 03/20/2016    Echo: 03/20/16 LV EF: 55% - 60%  ------------------------------------------------------------------- Indications: Chest pain  786.51.  ------------------------------------------------------------------- History: Risk factors: Hypertension.  ------------------------------------------------------------------- Study Conclusions  - Left ventricle: The cavity size was normal. Wall thickness was  increased in a pattern of mild LVH. Systolic function was normal.  The estimated ejection fraction was in the range of 55% to 60%.  Wall motion was normal; there were no regional wall motion  abnormalities. Doppler parameters are consistent with abnormal  left ventricular relaxation (grade 1 diastolic dysfunction). The  E/e&' ratio is between 8-15, suggesting indeterminate LV filling  pressure. - Mitral valve: Calcified annulus. Mildly thickened leaflets .  There was trivial regurgitation. - Left atrium: The atrium was normal in size. - Tricuspid valve: There was mild  regurgitation. - Pulmonary arteries: PA peak pressure: 23 mm Hg (S). - Inferior vena cava: The vessel was normal in size. The  respirophasic diameter changes were in the normal range (>= 50%),  consistent with normal central venous pressure.  Impressions:  - LVEF 55-60%, mild LVH, normal wall motion, diastolic dysfunction,  indeterminate LV filling pressure, normal LA size, mild TR, RVSP  23 mmHg, normal IVC.   ECG Sinus rhythm Vent. rate 74 BPM PR interval * ms QRS duration 75 ms QT/QTc 413/459 ms P-R-T axes 58 29 44  Radiology:  Dg Chest 2 View  03/20/2016  CLINICAL DATA:  Chest pressure since late evening. EXAM: CHEST  2 VIEW COMPARISON:  09/01/2013 FINDINGS: The heart size and mediastinal contours are within normal limits. Both lungs are clear. The visualized skeletal structures are unremarkable. IMPRESSION: No active cardiopulmonary disease. Electronically Signed   By: Andreas Newport M.D.   On: 03/20/2016 04:16    ASSESSMENT AND PLAN:     1. Chest pain - Her chest pain was responsive to sublingual nitroglycerin. She  is chest pain-free since admission. Troponin negative. EKG without acute ischemic changes. The patient has ruled out. Echo this admission showed LVEF 55-60%, mild LVH, normal wall motion, diastolic dysfunction, indeterminate LV filling pressure, normal LA size, mild TR, RVSP 23 mmHg, normal IVC.  Faint bibasilar rales but no signs of heart failure. We'll get BNP. - Dr. Oval Linsey to see later today. She can eat. Likely outpatient stress test.   2.  Hyperlipidemia - Pending lipid panel. Continue statin.   SignedLeanor Kail, PA 03/20/2016, 10:27 AM Pager [phone]  Co-Sign MD

## 2016-03-20 NOTE — Evaluation (Signed)
Physical Therapy Evaluation-1x Patient Details Name: Angela Barber MRN: [id number] DOB: 02-Sep-1936 Today's Date: 03/20/2016   History of Present Illness  80 yo female admitted with chest pain. Hx of cochlear implant, OA, anxiety.  Clinical Impression  On eval, pt was supervision-Min guard assist for mobility-walked ~200 feet and ascended/descended 9 steps. Pt denied chest pain. She tolerated activity well. HR in 90s during activity. Pt states she may have a friend check on her once back home. No further PT needs. Will sign off.     Follow Up Recommendations No PT follow up;Supervision - Intermittent (initially until returns to baseline. )    Equipment Recommendations  None recommended by PT    Recommendations for Other Services       Precautions / Restrictions Precautions Precautions: None Restrictions Weight Bearing Restrictions: No      Mobility  Bed Mobility Overal bed mobility: Independent                Transfers Overall transfer level: Independent                  Ambulation/Gait Ambulation/Gait assistance: Min guard;Supervision Ambulation Distance (Feet): 200 Feet Assistive device: None Gait Pattern/deviations: Step-through pattern;Drifts right/left     General Gait Details: slightly unsteady at times but no overt LOB. Pt denied chest pain. HR in 90s.   Stairs Stairs: Yes Stairs assistance: Supervision Stair Management: One rail Left;Alternating pattern;Forwards Number of Stairs: 9 General stair comments: for safety. No difficulty  Wheelchair Mobility    Modified Rankin (Stroke Patients Only)       Balance                                             Pertinent Vitals/Pain Pain Assessment: No/denies pain    Home Living Family/patient expects to be discharged to:: Private residence Living Arrangements: Alone Available Help at Discharge: Friend(s) Type of Home: House Home Access: Stairs to enter Entrance  Stairs-Rails: Right Entrance Stairs-Number of Steps: 7   Home Equipment: None      Prior Function Level of Independence: Independent               Hand Dominance        Extremity/Trunk Assessment   Upper Extremity Assessment: Overall WFL for tasks assessed           Lower Extremity Assessment: Overall WFL for tasks assessed      Cervical / Trunk Assessment: Normal  Communication   Communication: HOH  Cognition Arousal/Alertness: Awake/alert Behavior During Therapy: WFL for tasks assessed/performed Overall Cognitive Status: Within Functional Limits for tasks assessed                      General Comments      Exercises        Assessment/Plan    PT Assessment Patent does not need any further PT services  PT Diagnosis     PT Problem List    PT Treatment Interventions     PT Goals (Current goals can be found in the Care Plan section) Acute Rehab PT Goals Patient Stated Goal: home today PT Goal Formulation: All assessment and education complete, DC therapy    Frequency     Barriers to discharge        Co-evaluation  End of Session   Activity Tolerance: Patient tolerated treatment well Patient left: in bed;with call bell/phone within reach      Functional Assessment Tool Used: clinical judgement Functional Limitation: Mobility: Walking and moving around Mobility: Walking and Moving Around Current Status [id number]): At least 1 percent but less than 20 percent impaired, limited or restricted Mobility: Walking and Moving Around Goal Status [phone]): At least 1 percent but less than 20 percent impaired, limited or restricted Mobility: Walking and Moving Around Discharge Status [phone]): At least 1 percent but less than 20 percent impaired, limited or restricted    Time: 1353-1401 PT Time Calculation (min) (ACUTE ONLY): 8 min   Charges:   PT Evaluation $PT Eval Low Complexity: 1 Procedure     PT G Codes:   PT G-Codes  **NOT FOR INPATIENT CLASS** Functional Assessment Tool Used: clinical judgement Functional Limitation: Mobility: Walking and moving around Mobility: Walking and Moving Around Current Status [id number]): At least 1 percent but less than 20 percent impaired, limited or restricted Mobility: Walking and Moving Around Goal Status [phone]): At least 1 percent but less than 20 percent impaired, limited or restricted Mobility: Walking and Moving Around Discharge Status [phone]): At least 1 percent but less than 20 percent impaired, limited or restricted    Weston Anna, MPT Pager: [phone]

## 2016-03-20 NOTE — Discharge Instructions (Signed)
Follow with Primary MD Hoyt Koch, MD in 7 days   Get CBC, CMP, 2 view Chest X ray checked  by Primary MD next visit ( we routinely change or add medications that can affect your baseline labs and fluid status, therefore we recommend that you get the mentioned basic workup next visit with your PCP, your PCP may decide not to get them or add new tests based on their clinical decision)   Activity: As tolerated with Full fall precautions use walker/cane & assistance as needed   Disposition Home    Diet:   Heart Healthy  .  For Heart failure patients - Check your Weight same time everyday, if you gain over 2 pounds, or you develop in leg swelling, experience more shortness of breath or chest pain, call your Primary MD immediately. Follow Cardiac Low Salt Diet and 1.5 lit/day fluid restriction.   On your next visit with your primary care physician please Get Medicines reviewed and adjusted.   Please request your Prim.MD to go over all Hospital Tests and Procedure/Radiological results at the follow up, please get all Hospital records sent to your Prim MD by signing hospital release before you go home.   If you experience worsening of your admission symptoms, develop shortness of breath, life threatening emergency, suicidal or homicidal thoughts you must seek medical attention immediately by calling 911 or calling your MD immediately  if symptoms less severe.  You Must read complete instructions/literature along with all the possible adverse reactions/side effects for all the Medicines you take and that have been prescribed to you. Take any new Medicines after you have completely understood and accpet all the possible adverse reactions/side effects.   Do not drive, operate heavy machinery, perform activities at heights, swimming or participation in water activities or provide baby sitting services if your were admitted for syncope or siezures until you have seen by Primary MD or a  Neurologist and advised to do so again.  Do not drive when taking Pain medications.    Do not take more than prescribed Pain, Sleep and Anxiety Medications  Special Instructions: If you have smoked or chewed Tobacco  in the last 2 yrs please stop smoking, stop any regular Alcohol  and or any Recreational drug use.  Wear Seat belts while driving.   Please note  You were cared for by a hospitalist during your hospital stay. If you have any questions about your discharge medications or the care you received while you were in the hospital after you are discharged, you can call the unit and asked to speak with the hospitalist on call if the hospitalist that took care of you is not available. Once you are discharged, your primary care physician will handle any further medical issues. Please note that NO REFILLS for any discharge medications will be authorized once you are discharged, as it is imperative that you return to your primary care physician (or establish a relationship with a primary care physician if you do not have one) for your aftercare needs so that they can reassess your need for medications and monitor your lab values.

## 2016-03-20 NOTE — H&P (Signed)
History and Physical    Angela Barber [id number] DOB: 17-Oct-1935 DOA: 03/20/2016  Referring MD/NP/PA:   PCP: Hoyt Koch, MD   Patient coming from:  The patient is coming from home.  At baseline, pt is independent for most of ADL.  Chief Complaint: chest pain  HPI: Angela Barber is a 80 y.o. female with medical history significant of hyperlipidemia, depression, inciting, C. difficile colitis, insomnia, diverticulosis, hearing loss, s/p of cochlear implant, who presents with chest pain.  Patient reports that she started having chest pain at about 10:30 PM. It is located in the from the chest, constant, 7 out of 10 in severity, pressure-like, nonradiating. It is associated with nausea and dry heaves. She denies shortness of breath, but described difficult breathing. Patient does not have fever, chills, cough. No recent long distance traveling. No tenderness over calf area. Her chest pain has subsided after given dose of aspirin and nitroglycerin in the emergency room. Currently no CP or SOB. Patient does not have abdominal pain, diarrhea, symptoms of a UTI or unilateral weakness. Pt had a negative Myoview stress test on 09/26/13.  ED Course: pt was found to have negative troponin, WBC 12.3, temperature normal, no tachycardia, electrolytes and renal function okay, negative chest x-ray for acute abnormalities. Patient is placed on telemetry bed for observation.  Review of Systems:   General: no fevers, chills, no changes in body weight, has fatigue HEENT: no blurry vision, has poor hearing  Pulm: has dyspnea, no coughing, wheezing CV: has chest pain, no palpitations Abd: has nausea, no vomiting, abdominal pain, diarrhea, constipation GU: no dysuria, burning on urination, increased urinary frequency, hematuria  Ext: no leg edema Neuro: no unilateral weakness, numbness, or tingling, no vision change or hearing loss Skin: no rash MSK: No muscle spasm, no deformity, no limitation of  range of movement in spin Heme: No easy bruising.  Travel history: No recent long distant travel.  Allergy: No Known Allergies  Past Medical History  Diagnosis Date  . Insomnia   . C. difficile diarrhea 9/06  . Diverticulosis   . Osteoarthritis, knee   . Depression   . Internal hemorrhoid   . Hyperplastic colon polyp   . HLD (hyperlipidemia)   . Anxiety   . Hearing loss of both ears   . Cochlear implant status     Past Surgical History  Procedure Laterality Date  . Tonsillectomy    . Vaginal hysterectomy    . Cochlear implant  July '13    right ear. Dr. Idelle Crouch  . Bunionectomy      right  . Rotator cuff repair Left 2012  . Rotator cuff repair Right 2007 & 2009    Social History:  reports that she has quit smoking. She has never used smokeless tobacco. She reports that she drinks alcohol. She reports that she does not use illicit drugs.  Family History:  Family History  Problem Relation Age of Onset  . Breast cancer Maternal Aunt     aunts  . Stroke Sister   . Ovarian cancer Mother   . Transient ischemic attack Mother   . Anxiety disorder Daughter      Prior to Admission medications   Medication Sig Start Date End Date Taking? Authorizing Provider  acetaminophen (TYLENOL) 500 MG tablet Take 500 mg by mouth every 6 (six) hours as needed for mild pain.   Yes Historical Provider, MD  bifidobacterium infantis (ALIGN) capsule Take 1 capsule by mouth daily.   Yes  Historical Provider, MD  diphenhydramine-acetaminophen (TYLENOL PM) 25-500 MG TABS tablet Take 1 tablet by mouth at bedtime as needed (sleep).   Yes Historical Provider, MD    Physical Exam: Filed Vitals:   03/20/16 [id number] 03/20/16 0419 03/20/16 0426 03/20/16 0430  BP: 143/84 140/85 115/76 118/79  Pulse: 82 89 93 87  Temp:      TempSrc:      Resp: 19 18 17 14   Height:      Weight:      SpO2: 97% 96% 96% 93%   General: Not in acute distress HEENT:       Eyes: PERRL, EOMI, no scleral icterus.        ENT: No discharge from the ears and nose, no pharynx injection, no tonsillar enlargement. Has poor hearing.       Neck: No JVD, no bruit, no mass felt. Heme: No neck lymph node enlargement. Cardiac: S1/S2, RRR, No murmurs, No gallops or rubs. Pulm: No rales, wheezing, rhonchi or rubs. Abd: Soft, nondistended, nontender, no rebound pain, no organomegaly, BS present. GU: No hematuria Ext: No pitting leg edema bilaterally. 2+DP/PT pulse bilaterally. Musculoskeletal: No joint deformities, No joint redness or warmth, no limitation of ROM in spin. Skin: No rashes.  Neuro: Alert, oriented X3, cranial nerves II-XII grossly intact, moves all extremities normally. Psych: Patient is not psychotic, no suicidal or hemocidal ideation.  Labs on Admission: I have personally reviewed following labs and imaging studies  CBC:  Recent Labs Lab 03/20/16 0331  WBC 12.3*  HGB 16.6*  HCT 47.7*  MCV 90.3  PLT [id number]   Basic Metabolic Panel:  Recent Labs Lab 03/20/16 0331  NA 139  K 3.8  CL 102  CO2 29  GLUCOSE 118*  BUN 19  CREATININE 0.70  CALCIUM 9.9   GFR: Estimated Creatinine Clearance: 53.6 mL/min (by C-G formula based on Cr of 0.7). Liver Function Tests: No results for input(s): AST, ALT, ALKPHOS, BILITOT, PROT, ALBUMIN in the last 168 hours. No results for input(s): LIPASE, AMYLASE in the last 168 hours. No results for input(s): AMMONIA in the last 168 hours. Coagulation Profile: No results for input(s): INR, PROTIME in the last 168 hours. Cardiac Enzymes: No results for input(s): CKTOTAL, CKMB, CKMBINDEX, TROPONINI in the last 168 hours. BNP (last 3 results) No results for input(s): PROBNP in the last 8760 hours. HbA1C: No results for input(s): HGBA1C in the last 72 hours. CBG: No results for input(s): GLUCAP in the last 168 hours. Lipid Profile: No results for input(s): CHOL, HDL, LDLCALC, TRIG, CHOLHDL, LDLDIRECT in the last 72 hours. Thyroid Function Tests: No results for  input(s): TSH, T4TOTAL, FREET4, T3FREE, THYROIDAB in the last 72 hours. Anemia Panel: No results for input(s): VITAMINB12, FOLATE, FERRITIN, TIBC, IRON, RETICCTPCT in the last 72 hours. Urine analysis: No results found for: COLORURINE, APPEARANCEUR, LABSPEC, PHURINE, GLUCOSEU, HGBUR, BILIRUBINUR, KETONESUR, PROTEINUR, UROBILINOGEN, NITRITE, LEUKOCYTESUR Sepsis Labs: @LABRCNTIP (procalcitonin:4,lacticidven:4) )No results found for this or any previous visit (from the past 240 hour(s)).   Radiological Exams on Admission: Dg Chest 2 View  03/20/2016  CLINICAL DATA:  Chest pressure since late evening. EXAM: CHEST  2 VIEW COMPARISON:  09/01/2013 FINDINGS: The heart size and mediastinal contours are within normal limits. Both lungs are clear. The visualized skeletal structures are unremarkable. IMPRESSION: No active cardiopulmonary disease. Electronically Signed   By: Andreas Newport M.D.   On: 03/20/2016 04:16     EKG: Independently reviewed. Sinus rhythm, QTC 459, early R-wave progression, low voltage.  Assessment/Plan Principal Problem:   Chest pain Active Problems:   Hyperlipidemia   Hearing loss   Left knee pain   Insomnia   Chest pain: No infiltration on chest x-ray. Patient does not have signs of DVT, less likely to have PE. Given her risk factors, including old age, hyperlipidemia, and nature of pain (pressure like, alleviated by nitroglycerin), will do chest pain rule out.  - will place on Tele bed for obs - cycle CE q6 x3 and repeat her EKG in the am  - prn Nitroglycerin, Morphine - prn Zofran for nausea - start aspirin, lipitor  - Risk factor stratification: will check FLP and A1C  - 2d echo - please call Card in AM  HLD: Last LDL was 151 on 09/25/14. Pt is not taking statin at home -start lipitor now -f/u FLP  Left knee pain: stable -continue tylenol  Insomnia: Continue home med: diphenhydramine-acetaminophen    DVT ppx: SQ Heparin (if pt develops severe chest  pain or significantly elevated trop, will be easier to switch to IV heparin or stop heparin for procedure than using Lovenox).  Code Status: Full code Family Communication: None at bed side.   Disposition Plan:  Anticipate discharge back to previous home environment Consults called:  Not yet Admission status: Obs / tele   Date of Service 03/20/2016    Ivor Costa [facility] Pager [phone]  If 7PM-7AM, please contact night-coverage [email or web address] Password [email or web address] 03/20/2016, 6:10 AM

## 2016-03-20 NOTE — Discharge Summary (Signed)
Angela Barber, is a 80 y.o. female  DOB [birth year]  MRN [id number].  Admission date:  03/20/2016  Admitting Physician  Ivor Costa, MD  Discharge Date:  03/20/2016   Primary MD  Hoyt Koch, MD  Recommendations for primary care physician for things to follow:   Monitor secondary risk factors for CAD, outpatient follow-up with cardiology for a stress test within 1-2 weeks.   Admission Diagnosis  Chest pain at rest [R07.9]   Discharge Diagnosis  Chest pain at rest [R07.9]     Principal Problem:   Chest pain Active Problems:   Hyperlipidemia   Hearing loss   Left knee pain   Insomnia   Chest pain at rest      Past Medical History  Diagnosis Date  . Insomnia   . C. difficile diarrhea 9/06  . Diverticulosis   . Osteoarthritis, knee   . Depression   . Internal hemorrhoid   . Hyperplastic colon polyp   . HLD (hyperlipidemia)   . Anxiety   . Hearing loss of both ears   . Cochlear implant status     Past Surgical History  Procedure Laterality Date  . Tonsillectomy    . Vaginal hysterectomy    . Cochlear implant  July '13    right ear. Dr. Idelle Crouch  . Bunionectomy      right  . Rotator cuff repair Left 2012  . Rotator cuff repair Right 2007 & 2009       HPI  from the history and physical done on the day of admission:    Angela Barber is a 80 y.o. female with medical history significant of hyperlipidemia, depression, inciting, C. difficile colitis, insomnia, diverticulosis, hearing loss, s/p of cochlear implant, who presents with chest pain.  Patient reports that she started having chest pain at about 10:30 PM. It is located in the from the chest, constant, 7 out of 10 in severity, pressure-like, nonradiating. It is associated with nausea and dry heaves. She denies shortness of breath,  but described difficult breathing. Patient does not have fever, chills, cough. No recent long distance traveling. No tenderness over calf area. Her chest pain has subsided after given dose of aspirin and nitroglycerin in the emergency room. Currently no CP or SOB. Patient does not have abdominal pain, diarrhea, symptoms of a UTI or unilateral weakness. Angela had a negative Myoview stress test on 09/26/13.  ED Course: Angela was found to have negative troponin, WBC 12.3, temperature normal, no tachycardia, electrolytes and renal function okay, negative chest x-ray for acute abnormalities. Patient is placed on telemetry bed for observation.     Hospital Course:     1.Chest pain, ruled out for MI, EKG nonacute, echocardiogram stable with no wall motion abnormality and a preserved EF of 60%, seen by cardiology and cleared for home discharge on home medications. Will follow with cardiology within 1-2 weeks for an outpatient stress test.  2. Dyslipidemia. Defer to PCP for outpatient monitoring and management.   3. Insomnia and  chronic left knee pain. Continue home regimen unchanged.    Follow UP  Follow-up Information    Follow up with Hoyt Koch, MD. Schedule an appointment as soon as possible for a visit in 1 week.   Specialty:  Internal Medicine   Contact information:   [place] [id number] [phone]       Follow up with Skeet Latch, MD. Schedule an appointment as soon as possible for a visit in 1 week.   Specialty:  Cardiology   Contact information:   [address] [place] [address] [phone]        Consults obtained - Cards  Discharge Condition: Stable  Diet and Activity recommendation: See Discharge Instructions below  Discharge Instructions         Discharge Instructions    Diet - low sodium heart healthy    Complete by:  As directed      Discharge instructions    Complete by:  As directed   Follow with Primary MD  Hoyt Koch, MD in 7 days   Get CBC, CMP, 2 view Chest X ray checked  by Primary MD next visit ( we routinely change or add medications that can affect your baseline labs and fluid status, therefore we recommend that you get the mentioned basic workup next visit with your PCP, your PCP may decide not to get them or add new tests based on their clinical decision)   Activity: As tolerated with Full fall precautions use walker/cane & assistance as needed   Disposition Home    Diet:   Heart Healthy  .  For Heart failure patients - Check your Weight same time everyday, if you gain over 2 pounds, or you develop in leg swelling, experience more shortness of breath or chest pain, call your Primary MD immediately. Follow Cardiac Low Salt Diet and 1.5 lit/day fluid restriction.   On your next visit with your primary care physician please Get Medicines reviewed and adjusted.   Please request your Prim.MD to go over all Hospital Tests and Procedure/Radiological results at the follow up, please get all Hospital records sent to your Prim MD by signing hospital release before you go home.   If you experience worsening of your admission symptoms, develop shortness of breath, life threatening emergency, suicidal or homicidal thoughts you must seek medical attention immediately by calling 911 or calling your MD immediately  if symptoms less severe.  You Must read complete instructions/literature along with all the possible adverse reactions/side effects for all the Medicines you take and that have been prescribed to you. Take any new Medicines after you have completely understood and accpet all the possible adverse reactions/side effects.   Do not drive, operate heavy machinery, perform activities at heights, swimming or participation in water activities or provide baby sitting services if your were admitted for syncope or siezures until you have seen by Primary MD or a Neurologist and advised to do  so again.  Do not drive when taking Pain medications.    Do not take more than prescribed Pain, Sleep and Anxiety Medications  Special Instructions: If you have smoked or chewed Tobacco  in the last 2 yrs please stop smoking, stop any regular Alcohol  and or any Recreational drug use.  Wear Seat belts while driving.   Please note  You were cared for by a hospitalist during your hospital stay. If you have any questions about your discharge medications or the care  you received while you were in the hospital after you are discharged, you can call the unit and asked to speak with the hospitalist on call if the hospitalist that took care of you is not available. Once you are discharged, your primary care physician will handle any further medical issues. Please note that NO REFILLS for any discharge medications will be authorized once you are discharged, as it is imperative that you return to your primary care physician (or establish a relationship with a primary care physician if you do not have one) for your aftercare needs so that they can reassess your need for medications and monitor your lab values.     Discharge patient    Complete by:  As directed      Increase activity slowly    Complete by:  As directed              Discharge Medications       Medication List    STOP taking these medications        meloxicam 7.5 MG tablet  Commonly known as:  MOBIC      TAKE these medications        acetaminophen 500 MG tablet  Commonly known as:  TYLENOL  Take 500 mg by mouth every 6 (six) hours as needed for mild pain.     bifidobacterium infantis capsule  Take 1 capsule by mouth daily.     diphenhydramine-acetaminophen 25-500 MG Tabs tablet  Commonly known as:  TYLENOL PM  Take 1 tablet by mouth at bedtime as needed (sleep).     nitroGLYCERIN 0.4 MG SL tablet  Commonly known as:  NITROSTAT  Place 1 tablet (0.4 mg total) under the tongue every 5 (five) minutes as needed for  chest pain.        Major procedures and Radiology Reports - PLEASE review detailed and final reports for all details, in brief -    TTE  LVEF 55-60%, mild LVH, normal wall motion, diastolic dysfunction,  indeterminate LV filling pressure, normal LA size, mild TR, RVSP  23 mmHg, normal IVC.   Dg Chest 2 View  03/20/2016  CLINICAL DATA:  Chest pressure since late evening. EXAM: CHEST  2 VIEW COMPARISON:  09/01/2013 FINDINGS: The heart size and mediastinal contours are within normal limits. Both lungs are clear. The visualized skeletal structures are unremarkable. IMPRESSION: No active cardiopulmonary disease. Electronically Signed   By: Andreas Newport M.D.   On: 03/20/2016 04:16    Micro Results      No results found for this or any previous visit (from the past 240 hour(s)).     Today   Subjective    Angela Barber today has no headache,no chest abdominal pain,no new weakness tingling or numbness, feels much better wants to go home today.     Objective   Blood pressure 96/65, pulse 85, temperature 98.8 F (37.1 C), temperature source Oral, resp. rate 16, height 5\' 3"  (1.6 m), weight 69.2 kg (152 lb 8.9 oz), SpO2 97 %.   Intake/Output Summary (Last 24 hours) at 03/20/16 1702 Last data filed at 03/20/16 1300  Gross per 24 hour  Intake    240 ml  Output      0 ml  Net    240 ml    Exam Awake Alert, Oriented x 3, No new F.N deficits, Normal affect [place].AT,PERRAL Supple Neck,No JVD, No cervical lymphadenopathy appriciated.  Symmetrical Chest wall movement, Good air movement bilaterally, CTAB RRR,No Gallops,Rubs or  new Murmurs, No Parasternal Heave +ve B.Sounds, Abd Soft, Non tender, No organomegaly appriciated, No rebound -guarding or rigidity. No Cyanosis, Clubbing or edema, No new Rash or bruise   Data Review   CBC w Diff:  Lab Results  Component Value Date   WBC 12.3* 03/20/2016   HGB 16.6* 03/20/2016   HCT 47.7* 03/20/2016   PLT 291 03/20/2016   LYMPHOPCT  38.6 05/09/2015   MONOPCT 9.0 05/09/2015   EOSPCT 3.0 05/09/2015   BASOPCT 0.7 05/09/2015    CMP:  Lab Results  Component Value Date   NA 139 03/20/2016   K 3.8 03/20/2016   CL 102 03/20/2016   CO2 29 03/20/2016   BUN 19 03/20/2016   CREATININE 0.70 03/20/2016   PROT 6.5 05/09/2015   ALBUMIN 4.0 05/09/2015   BILITOT 0.5 05/09/2015   ALKPHOS 55 05/09/2015   AST 19 05/09/2015   ALT 15 05/09/2015  . Lab Results  Component Value Date   CHOL 228* 09/25/2014   HDL 43.60 09/25/2014   LDLCALC 151* 09/25/2014   TRIG 166.0* 09/25/2014   CHOLHDL 5 09/25/2014     Total Time in preparing paper work, data evaluation and todays exam - 35 minutes  Thurnell Lose M.D on 03/20/2016 at [facility]   Office  [phone]

## 2016-03-20 NOTE — ED Provider Notes (Signed)
CSN: [id number]     Arrival date & time 03/20/16  [id number] History  By signing my name below, I, Emmanuella Mensah, attest that this documentation has been prepared under the direction and in the presence of Shanon Rosser, MD. Electronically Signed: Judithann Sauger, ED Scribe. 03/20/2016. 3:59 AM.    Chief Complaint  Patient presents with  . Chest Pain   The history is provided by the patient. No language interpreter was used.   HPI Comments: Angela Barber is a 80 y.o. female who presents to the Emergency Department complaining of gradually improving chest tightness onset 10:45 pm yesterday. Pt notes that her at her worse (10/10) it felt as "though an elephant was sitting on her chest" but her current pain is at a 3/10. She reports associated nausea and one episode of non-bloody vomiting. She states that no specific position alleviates her chest tightness. She reports that she took Tylenol with She denies any SOB, acute abdominal pain, or numbness/weakness.   PCP: Dr. Sharlet Salina  Past Medical History  Diagnosis Date  . Insomnia   . C. difficile diarrhea 9/06  . Diverticulosis   . Osteoarthritis, knee   . Depression   . Internal hemorrhoid   . Hyperplastic colon polyp   . HLD (hyperlipidemia)   . Anxiety   . Hearing loss of both ears   . Cochlear implant status    Past Surgical History  Procedure Laterality Date  . Tonsillectomy    . Vaginal hysterectomy    . Cochlear implant  July '13    right ear. Dr. Idelle Crouch  . Bunionectomy      right  . Rotator cuff repair Left 2012  . Rotator cuff repair Right 2007 & 2009   Family History  Problem Relation Age of Onset  . Breast cancer Maternal Aunt     aunts  . Stroke Sister   . Ovarian cancer Mother   . Transient ischemic attack Mother   . Anxiety disorder Daughter    Social History  Substance Use Topics  . Smoking status: Former Research scientist (life sciences)  . Smokeless tobacco: Never Used  . Alcohol Use: Yes     Comment: 1 glass of wine a day    OB History    No data available     Review of Systems  A complete 10 system review of systems was obtained and all systems are negative except as noted in the HPI and PMH.    Allergies  Review of patient's allergies indicates no known allergies.  Home Medications   Prior to Admission medications   Medication Sig Start Date End Date Taking? Authorizing Provider  acetaminophen (TYLENOL) 500 MG tablet Take 500 mg by mouth every 6 (six) hours as needed for mild pain.   Yes Historical Provider, MD  bifidobacterium infantis (ALIGN) capsule Take 1 capsule by mouth daily.   Yes Historical Provider, MD  diphenhydramine-acetaminophen (TYLENOL PM) 25-500 MG TABS tablet Take 1 tablet by mouth at bedtime as needed (sleep).   Yes Historical Provider, MD  meloxicam (MOBIC) 7.5 MG tablet Take 1 tablet (7.5 mg total) by mouth daily. Patient not taking: Reported on 03/20/2016 11/16/15   Rubbie Battiest, NP   BP 118/79 mmHg  Pulse 87  Temp(Src) 97.8 F (36.6 C) (Oral)  Resp 14  Ht 5\' 3"  (1.6 m)  Wt 150 lb (68.04 kg)  BMI 26.58 kg/m2  SpO2 93% Physical Exam  Nursing note and vitals reviewed.  General: Well-developed, well-nourished female in no acute distress;  appearance consistent with age of record HENT: normocephalic; atraumatic Eyes: pupils equal, round and reactive to light; extraocular muscles intact Neck: supple Heart: regular rate and rhythm; no murmurs, rubs or gallops Lungs: clear to auscultation bilaterally Abdomen: soft; nondistended; nontender; no masses or hepatosplenomegaly; bowel sounds present Extremities: No deformity; full range of motion; pulses normal Neurologic: Awake, alert and oriented; motor function intact in all extremities and symmetric; no facial droop, hard of hearing Skin: Warm and dry Psychiatric: Normal mood and affect  ED Course  Procedures (including critical care time) DIAGNOSTIC STUDIES: Oxygen Saturation is 96% on RA, normal by my interpretation.      EKG Interpretation   Date/Time:  Thursday March 20 2016 03:34:36 EDT Ventricular Rate:  74 PR Interval:    QRS Duration: 75 QT Interval:  413 QTC Calculation: 459 R Axis:   29 Text Interpretation:  Sinus rhythm Borderline low voltage, extremity leads  No significant change was found Confirmed by Florina Ou  MD, Jenny Reichmann ([id number]) on  03/20/2016 3:40:36 AM      MDM   Nursing notes and vitals signs, including pulse oximetry, reviewed.  Summary of this visit's results, reviewed by myself:  Labs:  Results for orders placed or performed during the hospital encounter of 03/20/16 (from the past 24 hour(s))  Basic metabolic panel     Status: Abnormal   Collection Time: 03/20/16  3:31 AM  Result Value Ref Range   Sodium 139 135 - 145 mmol/L   Potassium 3.8 3.5 - 5.1 mmol/L   Chloride 102 101 - 111 mmol/L   CO2 29 22 - 32 mmol/L   Glucose, Bld 118 (H) 65 - 99 mg/dL   BUN 19 6 - 20 mg/dL   Creatinine, Ser 0.70 0.44 - 1.00 mg/dL   Calcium 9.9 8.9 - 10.3 mg/dL   GFR calc non Af Amer >60 >60 mL/min   GFR calc Af Amer >60 >60 mL/min   Anion gap 8 5 - 15  CBC     Status: Abnormal   Collection Time: 03/20/16  3:31 AM  Result Value Ref Range   WBC 12.3 (H) 4.0 - 10.5 K/uL   RBC 5.28 (H) 3.87 - 5.11 MIL/uL   Hemoglobin 16.6 (H) 12.0 - 15.0 g/dL   HCT 47.7 (H) 36.0 - 46.0 %   MCV 90.3 78.0 - 100.0 fL   MCH 31.4 26.0 - 34.0 pg   MCHC 34.8 30.0 - 36.0 g/dL   RDW 13.2 11.5 - 15.5 %   Platelets 291 150 - 400 K/uL  I-stat troponin, ED     Status: None   Collection Time: 03/20/16  4:02 AM  Result Value Ref Range   Troponin i, poc 0.01 0.00 - 0.08 ng/mL   Comment 3            Imaging Studies: Dg Chest 2 View  03/20/2016  CLINICAL DATA:  Chest pressure since late evening. EXAM: CHEST  2 VIEW COMPARISON:  09/01/2013 FINDINGS: The heart size and mediastinal contours are within normal limits. Both lungs are clear. The visualized skeletal structures are unremarkable. IMPRESSION: No active  cardiopulmonary disease. Electronically Signed   By: Andreas Newport M.D.   On: 03/20/2016 04:16   5:10 AM Patient pain-free after sublingual nitroglycerin 2.  Final diagnoses:  Chest pain at rest   I personally performed the services described in this documentation, which was scribed in my presence. The recorded information has been reviewed and is accurate.    Shanon Rosser, MD 03/20/16  0511 

## 2016-03-20 NOTE — Progress Notes (Signed)
  Echocardiogram 2D Echocardiogram has been performed.  Angela Barber 03/20/2016, 10:54 AM

## 2016-03-21 ENCOUNTER — Telehealth: Payer: Self-pay

## 2016-03-21 LAB — HEMOGLOBIN A1C
Hgb A1c MFr Bld: 5.7 % — ABNORMAL HIGH (ref 4.8–5.6)
MEAN PLASMA GLUCOSE: 117 mg/dL

## 2016-03-21 NOTE — Telephone Encounter (Signed)
"  Get CBC, CMP, 2 view Chest X ray checked by Primary MD next visit (we routinely change or add medications that can affect your baseline labs and fluid status, therefore we recommend that you get the mentioned basic workup next visit with your PCP, your PCP may decide not to get them or add new tests based on their clinical decision)"  Transition Care Management Follow-up Telephone Call   Date discharged? 03/20/2016  How have you been since you were released from the hospital? Patient is feeling better than before she was admitted.   Do you understand why you were in the hospital? Patient understands why she was admitted.  Do you understand the discharge instructions? Yes  Where were you discharged to? Home  Items Reviewed:  Medications reviewed: Yes  Allergies reviewed: Yes  Dietary changes reviewed: Yes  Referrals reviewed: Yes  Functional Questionnaire:   Activities of Daily Living (ADLs):   States they are independent in the following: Independent in all ADL's States they require assistance with the following: n/a  Any transportation issues/concerns?: No  Any patient concerns? No  Confirmed importance and date/time of follow-up visits scheduled? Yes  Provider Appointment booked with: PCP on 03/27/16 at 2pm.   Confirmed with patient if condition begins to worsen call PCP or go to the ER.  Patient was given the office number and encouraged to call back with question or concerns: Yes

## 2016-03-27 ENCOUNTER — Telehealth (HOSPITAL_COMMUNITY): Payer: Self-pay | Admitting: *Deleted

## 2016-03-27 ENCOUNTER — Ambulatory Visit (INDEPENDENT_AMBULATORY_CARE_PROVIDER_SITE_OTHER): Payer: Medicare Other | Admitting: Internal Medicine

## 2016-03-27 ENCOUNTER — Encounter: Payer: Self-pay | Admitting: Internal Medicine

## 2016-03-27 VITALS — BP 116/70 | HR 101 | Temp 97.9°F | Resp 14 | Ht 63.0 in | Wt 146.0 lb

## 2016-03-27 DIAGNOSIS — M25561 Pain in right knee: Secondary | ICD-10-CM

## 2016-03-27 DIAGNOSIS — H9193 Unspecified hearing loss, bilateral: Secondary | ICD-10-CM

## 2016-03-27 DIAGNOSIS — R079 Chest pain, unspecified: Secondary | ICD-10-CM | POA: Diagnosis not present

## 2016-03-27 DIAGNOSIS — Z5189 Encounter for other specified aftercare: Secondary | ICD-10-CM

## 2016-03-27 NOTE — Assessment & Plan Note (Signed)
With cochlear implant and should be able to do MRI with 1.5 tesla only. This was not verified during visit with Dr. Idelle Crouch and is patient reported. [employer] website seems to concur with brief review during visit. She did not know exact name of her implant.

## 2016-03-27 NOTE — Telephone Encounter (Signed)
Left message on voicemail in reference to upcoming appointment scheduled for 7/11/17Phone number given for a call back so details instructions can be given. Angela Barber

## 2016-03-27 NOTE — Patient Instructions (Signed)
We will have you schedule with Dr. Charlann Boxer for the knees to have him evaluate them and give you his opinion and help them.   We will follow the results on the stress test next week.   You can try to drink more water during the day to help with the cramps at night time.  Come back for your physical later this year.   Potassium Content of Foods Potassium is a mineral found in many foods and drinks. It helps keep fluids and minerals balanced in your body and affects how steadily your heart beats. Potassium also helps control your blood pressure and keep your muscles and nervous system healthy. Certain health conditions and medicines may change the balance of potassium in your body. When this happens, you can help balance your level of potassium through the foods that you do or do not eat. Your health care provider or dietitian may recommend an amount of potassium that you should have each day. The following lists of foods provide the amount of potassium (in parentheses) per serving in each item. HIGH IN POTASSIUM  The following foods and beverages have 200 mg or more of potassium per serving:  Apricots, 2 raw or 5 dry (200 mg).  Artichoke, 1 medium (345 mg).  Avocado, raw,  each (245 mg).  Banana, 1 medium (425 mg).  Beans, lima, or baked beans, canned,  cup (280 mg).  Beans, white, canned,  cup (595 mg).  Beef roast, 3 oz (320 mg).  Beef, ground, 3 oz (270 mg).  Beets, raw or cooked,  cup (260 mg).  Bran muffin, 2 oz (300 mg).  Broccoli,  cup (230 mg).  Brussels sprouts,  cup (250 mg).  Cantaloupe,  cup (215 mg).  Cereal, 100% bran,  cup (200-400 mg).  Cheeseburger, single, fast food, 1 each (225-400 mg).  Chicken, 3 oz (220 mg).  Clams, canned, 3 oz (535 mg).  Crab, 3 oz (225 mg).  Dates, 5 each (270 mg).  Dried beans and peas,  cup (300-475 mg).  Figs, dried, 2 each (260 mg).  Fish: halibut, tuna, cod, snapper, 3 oz (480 mg).  Fish: salmon,  haddock, swordfish, perch, 3 oz (300 mg).  Fish, tuna, canned 3 oz (200 mg).  [place] fries, fast food, 3 oz (470 mg).  Granola with fruit and nuts,  cup (200 mg).  Grapefruit juice,  cup (200 mg).  Greens, beet,  cup (655 mg).  Honeydew melon,  cup (200 mg).  Kale, raw, 1 cup (300 mg).  Kiwi, 1 medium (240 mg).  Kohlrabi, rutabaga, parsnips,  cup (280 mg).  Lentils,  cup (365 mg).  Mango, 1 each (325 mg).  Milk, chocolate, 1 cup (420 mg).  Milk: nonfat, low-fat, whole, buttermilk, 1 cup (350-380 mg).  Molasses, 1 Tbsp (295 mg).  Mushrooms,  cup (280) mg.  Nectarine, 1 each (275 mg).  Nuts: almonds, peanuts, hazelnuts, [place], cashew, mixed, 1 oz (200 mg).  Nuts, pistachios, 1 oz (295 mg).  Orange, 1 each (240 mg).  Orange juice,  cup (235 mg).  Papaya, medium,  fruit (390 mg).  Peanut butter, chunky, 2 Tbsp (240 mg).  Peanut butter, smooth, 2 Tbsp (210 mg).  Pear, 1 medium (200 mg).  Pomegranate, 1 whole (400 mg).  Pomegranate juice,  cup (215 mg).  Pork, 3 oz (350 mg).  Potato chips, salted, 1 oz (465 mg).  Potato, baked with skin, 1 medium (925 mg).  Potatoes, boiled,  cup (255 mg).  Potatoes,  mashed,  cup (330 mg).  Prune juice,  cup (370 mg).  Prunes, 5 each (305 mg).  Pudding, chocolate,  cup (230 mg).  Pumpkin, canned,  cup (250 mg).  Raisins, seedless,  cup (270 mg).  Seeds, sunflower or pumpkin, 1 oz (240 mg).  Soy milk, 1 cup (300 mg).  Spinach,  cup (420 mg).  Spinach, canned,  cup (370 mg).  Sweet potato, baked with skin, 1 medium (450 mg).  Swiss chard,  cup (480 mg).  Tomato or vegetable juice,  cup (275 mg).  Tomato sauce or puree,  cup (400-550 mg).  Tomato, raw, 1 medium (290 mg).  Tomatoes, canned,  cup (200-300 mg).  [place], 3 oz (250 mg).  Wheat germ, 1 oz (250 mg).  Winter squash,  cup (250 mg).  Yogurt, plain or fruited, 6 oz (260-435 mg).  Zucchini,  cup (220  mg). MODERATE IN POTASSIUM The following foods and beverages have 50-200 mg of potassium per serving:  Apple, 1 each (150 mg).  Apple juice,  cup (150 mg).  Applesauce,  cup (90 mg).  Apricot nectar,  cup (140 mg).  Asparagus, small spears,  cup or 6 spears (155 mg).  Bagel, cinnamon raisin, 1 each (130 mg).  Bagel, egg or plain, 4 in., 1 each (70 mg).  Beans, green,  cup (90 mg).  Beans, yellow,  cup (190 mg).  Beer, regular, 12 oz (100 mg).  Beets, canned,  cup (125 mg).  Blackberries,  cup (115 mg).  Blueberries,  cup (60 mg).  Bread, whole wheat, 1 slice (70 mg).  Broccoli, raw,  cup (145 mg).  Cabbage,  cup (150 mg).  Carrots, cooked or raw,  cup (180 mg).  Cauliflower, raw,  cup (150 mg).  Celery, raw,  cup (155 mg).  Cereal, bran flakes, cup (120-150 mg).  Cheese, cottage,  cup (110 mg).  Cherries, 10 each (150 mg).  Chocolate, 1 oz bar (165 mg).  Coffee, brewed 6 oz (90 mg).  Corn,  cup or 1 ear (195 mg).  Cucumbers,  cup (80 mg).  Egg, large, 1 each (60 mg).  Eggplant,  cup (60 mg).  Endive, raw, cup (80 mg).  English muffin, 1 each (65 mg).  Fish, orange roughy, 3 oz (150 mg).  Frankfurter, beef or pork, 1 each (75 mg).  Fruit cocktail,  cup (115 mg).  Grape juice,  cup (170 mg).  Grapefruit,  fruit (175 mg).  Grapes,  cup (155 mg).  Greens: kale, turnip, collard,  cup (110-150 mg).  Ice cream or frozen yogurt, chocolate,  cup (175 mg).  Ice cream or frozen yogurt, vanilla,  cup (120-150 mg).  Lemons, limes, 1 each (80 mg).  Lettuce, all types, 1 cup (100 mg).  Mixed vegetables,  cup (150 mg).  Mushrooms, raw,  cup (110 mg).  Nuts: walnuts, pecans, or macadamia, 1 oz (125 mg).  Oatmeal,  cup (80 mg).  Okra,  cup (110 mg).  Onions, raw,  cup (120 mg).  Peach, 1 each (185 mg).  Peaches, canned,  cup (120 mg).  Pears, canned,  cup (120 mg).  Peas, green, frozen,  cup (90  mg).  Peppers, green,  cup (130 mg).  Peppers, red,  cup (160 mg).  Pineapple juice,  cup (165 mg).  Pineapple, fresh or canned,  cup (100 mg).  Plums, 1 each (105 mg).  Pudding, vanilla,  cup (150 mg).  Raspberries,  cup (90 mg).  Rhubarb,  cup (115 mg).  Rice, wild,  cup (80 mg).  Shrimp, 3 oz (155 mg).  Spinach, raw, 1 cup (170 mg).  Strawberries,  cup (125 mg).  Summer squash  cup (175-200 mg).  Swiss chard, raw, 1 cup (135 mg).  Tangerines, 1 each (140 mg).  Tea, brewed, 6 oz (65 mg).  Turnips,  cup (140 mg).  Watermelon,  cup (85 mg).  Wine, red, table, 5 oz (180 mg).  Wine, white, table, 5 oz (100 mg). LOW IN POTASSIUM The following foods and beverages have less than 50 mg of potassium per serving.  Bread, white, 1 slice (30 mg).  Carbonated beverages, 12 oz (less than 5 mg).  Cheese, 1 oz (20-30 mg).  Cranberries,  cup (45 mg).  Cranberry juice cocktail,  cup (20 mg).  Fats and oils, 1 Tbsp (less than 5 mg).  Hummus, 1 Tbsp (32 mg).  Nectar: papaya, mango, or pear,  cup (35 mg).  Rice, white or brown,  cup (50 mg).  Spaghetti or macaroni,  cup cooked (30 mg).  Tortilla, flour or corn, 1 each (50 mg).  Waffle, 4 in., 1 each (50 mg).  Water chestnuts,  cup (40 mg).   This information is not intended to replace advice given to you by your health care provider. Make sure you discuss any questions you have with your health care provider.   Document Released: 04/22/2005 Document Revised: 09/13/2013 Document Reviewed: 08/05/2013 Elsevier Interactive Patient Education [employer].

## 2016-03-27 NOTE — Progress Notes (Signed)
   Subjective:    Patient ID: Angela Barber, female    DOB: Mar 03, 1936, 80 y.o.   MRN: [id number]  HPI The patient is here for transition of care hospital follow up visit (in for chest pain at rest, serial troponins negative for acute MI with echo stable and EKG without changes). She is getting a stress test next week with cardiology. She is feeling better overall and without chest pains now. She is taking a baby aspirin daily. Doing limited activity due to her knee pain.  Having right knee pain. Saw orthopedics and they wanted MRI. She can get MRI but needs to be 1.5 tesla MRI machine due to her cochlear implants. Sees MD at [facility] Dr. Idelle Crouch that knows the details about MRI machine. They told her that they talked to them and she could not get the MRI but she saw them the next day and they told her that they did not get any calls. She was recommended instead to get knee surgery which she did not follow though with due to lack of trust at that office. Pain in her knees but right knee especially worse with walking long distances. Used to be able to walk 4 miles per day and now not able due to pain. No injury and does not give out on her.   PMH, [facility] social history reviewed and updated.   Review of Systems  Constitutional: Negative for fever, activity change, appetite change, fatigue and unexpected weight change.  HENT: Negative.   Eyes: Negative.   Respiratory: Negative for cough, chest tightness, shortness of breath and wheezing.   Cardiovascular: Negative for chest pain, palpitations and leg swelling.  Gastrointestinal: Positive for diarrhea. Negative for abdominal pain, constipation and abdominal distention.       Rare  Musculoskeletal: Positive for arthralgias. Negative for myalgias and gait problem.  Skin: Negative.   Neurological: Negative for dizziness, weakness, light-headedness and headaches.  Hematological: Negative.       Objective:   Physical Exam  Constitutional: She is oriented  to person, place, and time. She appears well-developed and well-nourished.  HENT:  Head: Normocephalic and atraumatic.  Eyes: EOM are normal.  Neck: Normal range of motion.  Cardiovascular: Normal rate and regular rhythm.   No murmur heard. Pulmonary/Chest: Effort normal and breath sounds normal. No respiratory distress. She has no wheezes. She has no rales.  Abdominal: Soft. Bowel sounds are normal. She exhibits no distension. There is no tenderness. There is no rebound.  Musculoskeletal: She exhibits no edema.  Neurological: She is alert and oriented to person, place, and time. Coordination normal.  Skin: Skin is warm and dry.   Filed Vitals:   03/27/16 1412  BP: 116/70  Pulse: 101  Temp: 97.9 F (36.6 C)  TempSrc: Oral  Resp: 14  Height: 5\' 3"  (1.6 m)  Weight: 146 lb (66.225 kg)  SpO2: 98%      Assessment & Plan:

## 2016-03-27 NOTE — Assessment & Plan Note (Signed)
None since the hospital and taking daily 81 mg aspirin. She will get stress test next week. Labs okay and no meds changes and no need for repeat today.

## 2016-03-27 NOTE — Progress Notes (Signed)
Pre visit review using our clinic review tool, if applicable. No additional management support is needed unless otherwise documented below in the visit note. 

## 2016-03-27 NOTE — Assessment & Plan Note (Signed)
Referral to sports medicine. She is slightly upset at the treatment at the orthopedics clinic she attended previously. Does take tylenol for pain and doing okay with that. Wants to be back to functioning well.

## 2016-04-01 ENCOUNTER — Ambulatory Visit (HOSPITAL_COMMUNITY): Payer: Medicare Other | Attending: Cardiology

## 2016-04-01 DIAGNOSIS — R079 Chest pain, unspecified: Secondary | ICD-10-CM | POA: Insufficient documentation

## 2016-04-01 LAB — MYOCARDIAL PERFUSION IMAGING
CHL CUP NUCLEAR SDS: 10
CHL CUP RESTING HR STRESS: 75 {beats}/min
LHR: 0.11
LVDIAVOL: 36 mL (ref 46–106)
LVSYSVOL: 5 mL
NUC STRESS TID: 1.05
Peak HR: 100 {beats}/min
SRS: 8
SSS: 18

## 2016-04-01 MED ORDER — TECHNETIUM TC 99M TETROFOSMIN IV KIT
10.8000 | PACK | Freq: Once | INTRAVENOUS | Status: AC | PRN
  Administered 2016-04-01: 11 via INTRAVENOUS
  Filled 2016-04-01: qty 11

## 2016-04-01 MED ORDER — REGADENOSON 0.4 MG/5ML IV SOLN
0.4000 mg | Freq: Once | INTRAVENOUS | Status: AC
  Administered 2016-04-01: 0.4 mg via INTRAVENOUS

## 2016-04-01 MED ORDER — TECHNETIUM TC 99M TETROFOSMIN IV KIT
32.6000 | PACK | Freq: Once | INTRAVENOUS | Status: AC | PRN
Start: 2016-04-01 — End: 2016-04-01
  Administered 2016-04-01: 33 via INTRAVENOUS
  Filled 2016-04-01: qty 33

## 2016-04-10 ENCOUNTER — Ambulatory Visit (INDEPENDENT_AMBULATORY_CARE_PROVIDER_SITE_OTHER): Payer: Medicare Other | Admitting: Family Medicine

## 2016-04-10 ENCOUNTER — Other Ambulatory Visit: Payer: Self-pay

## 2016-04-10 ENCOUNTER — Encounter: Payer: Self-pay | Admitting: Family Medicine

## 2016-04-10 VITALS — BP 116/78 | HR 74 | Wt 149.0 lb

## 2016-04-10 DIAGNOSIS — M25562 Pain in left knee: Secondary | ICD-10-CM

## 2016-04-10 DIAGNOSIS — M1712 Unilateral primary osteoarthritis, left knee: Secondary | ICD-10-CM

## 2016-04-10 NOTE — Patient Instructions (Addendum)
Good to see you.  Ice 20 minutes 2 times daily. Usually after activity and before bed. pennsaid pinkie amount topically 2 times daily as needed.  Vitamin D 2000 IU daily  Turmeric 500mg  daily Tart cherry extract any dose at night  Stay active  Injected knee today  We will get the company to call you.  Can do the other injections as well if not better when I see you again in 4-6 weeks.

## 2016-04-10 NOTE — Assessment & Plan Note (Signed)
Patient given injection today. Tolerated the procedure well. We discussed icing regimen and home exercises. Discussed which activities to do in which ones to potentially avoid. Trial topical anti-inflammatories given. Discussed proper shoes. Patient will come back again in 4 weeks. We will get her a custom brace with patient having instability. Patient continues to have pain she would be a candidate for viscous supple mentation. We did discuss again about an MRI but due to the amount of osteophytic changes of the medial compartment I do not think that she is a candidate for arthroscopic procedure and likely would need total knee replacement which patient does not want to do.

## 2016-04-10 NOTE — Progress Notes (Signed)
[name] [address] [place], [place] [address] Phone: [phone] Subjective:    I'm seeing this patient by the request  of:  Hoyt Koch, MD   CC: knee pain  [id number] Angela Barber is a 80 y.o. female coming in with complaint of knee pain.patient states that this seems to be more left-sided. Has been told that she had very mild arthritis. Question for an MRI and here. The Provider and They Wanted to Get an MRI. Had a Miscommunication and Unfortunately with Patient's History of Cochlear Implant Needs a Specific MRI. Patient Is Here for Transfer of Care.  Patient states that she continues to have some instability of the left knee. Rates the severity of pain sometimes is 8 out of 10. An states that there sometimes swelling. Not responding to the over-the-counter medicines the seem to be helping previously. Denies radiation down the leg. States that it seemed to start in the posterior aspect of the knee and now radiates to the whole knee. Denies any weakness. Sometimes it can have pain at night.  Patient did have x-rays in December 2016 that were independently visualized by me. X-ray show very mild to moderate tricompartmental degenerative changes.     Past Medical History  Diagnosis Date  . Insomnia   . C. difficile diarrhea 9/06  . Diverticulosis   . Osteoarthritis, knee   . Depression   . Internal hemorrhoid   . Hyperplastic colon polyp   . HLD (hyperlipidemia)   . Anxiety   . Hearing loss of both ears   . Cochlear implant status    Past Surgical History  Procedure Laterality Date  . Tonsillectomy    . Vaginal hysterectomy    . Cochlear implant  July '13    right ear. Dr. Idelle Crouch  . Bunionectomy      right  . Rotator cuff repair Left 2012  . Rotator cuff repair Right 2007 & 2009   Social History   Social History  . Marital Status: Widowed    Spouse Name: N/A  . Number of Children: 2  . Years of Education: Master's    Occupational History  . retired    Social History Main Topics  . Smoking status: Former Research scientist (life sciences)  . Smokeless tobacco: Never Used  . Alcohol Use: Yes     Comment: 1 glass of wine a day  . Drug Use: No  . Sexual Activity: Not on file   Other Topics Concern  . Not on file   Social History Narrative   Patient is widowed and lives alone.   Patient has two adult children.   Patient is retired.   Patient has a Scientist, water quality.   Patient is right-handed.   Patient drinks two cups of coffee daily.   No Known Allergies Family History  Problem Relation Age of Onset  . Breast cancer Maternal Aunt     aunts  . Stroke Sister   . Ovarian cancer Mother   . Transient ischemic attack Mother   . Anxiety disorder Daughter     Past medical history, social, surgical and family history all reviewed in electronic medical record.  No pertanent information unless stated regarding to the chief complaint.   Review of Systems: No headache, visual changes, nausea, vomiting, diarrhea, constipation, dizziness, abdominal pain, skin rash, fevers, chills, night sweats, weight loss, swollen lymph nodes, body aches, joint swelling, muscle aches, chest pain, shortness of breath, mood changes.   Objective There were  no vitals taken for this visit.  General: No apparent distress alert and oriented x3 mood and affect normal, dressed appropriately.  HEENT: Pupils equal, extraocular movements intact  Respiratory: Patient's speak in full sentences and does not appear short of breath  Cardiovascular: No lower extremity edema, non tender, no erythema  Skin: Warm dry intact with no signs of infection or rash on extremities or on axial skeleton.  Abdomen: Soft nontender  Neuro: Cranial nerves II through XII are intact, neurovascularly intact in all extremities with 2+ DTRs and 2+ pulses.  Lymph: No lymphadenopathy of posterior or anterior cervical chain or axillae bilaterally.  Gait mild antalgic gait MSK:  Non  tender with full range of motion and good stability and symmetric strength and tone of shoulders, elbows, wrist, hip, and ankles bilaterally.  Knee:left Valgus deformity noted with significant arthritic changes of the medial compartment enlarged and to calf ratio  Tender to palpation over the medial joint line as well as the posterior popliteal area no mass appreciated ROM full in flexion and extension and lower leg rotation. Mild instability with valgus force Negative Mcmurray's, Apley's, and Thessalonian tests. Mild painful patellar compression. Patellar glide mild crepitus. Patellar and quadriceps tendons unremarkable. Hamstring and quadriceps strength is normal.  Contralateral knee also has significant arthritic changes with valgus instability  MSK US performed of: left knee This study was ordered, performed, and interpreted by Charlann Boxer D.O.  Knee: All structures visualized. Marland Kitchen degenerative tear with displacement of the medial meniscus with severe narrowing of the joint line Patellar Tendon unremarkable on long and transverse views without effusion. No abnormality of prepatellar bursa. Degenerative changes of the MCL  IMPRESSION:  Severe medial compartment arthritis with degenerative meniscal tear  After informed written and verbal consent, patient was seated on exam table. Left knee was prepped with alcohol swab and utilizing anterolateral approach, patient's left knee space was injected with 4:1  marcaine 0.5%: Kenalog 40mg /dL. Patient tolerated the procedure well without immediate complications.    Impression and Recommendations:     This case required medical decision making of moderate complexity.      Note: This dictation was prepared with Dragon dictation along with smaller phrase technology. Any transcriptional errors that result from this process are unintentional.

## 2016-05-05 ENCOUNTER — Ambulatory Visit (INDEPENDENT_AMBULATORY_CARE_PROVIDER_SITE_OTHER): Payer: Medicare Other | Admitting: Cardiovascular Disease

## 2016-05-05 ENCOUNTER — Encounter: Payer: Self-pay | Admitting: Cardiovascular Disease

## 2016-05-05 VITALS — BP 130/80 | HR 81 | Ht 63.0 in | Wt 150.4 lb

## 2016-05-05 DIAGNOSIS — R0789 Other chest pain: Secondary | ICD-10-CM | POA: Diagnosis not present

## 2016-05-05 DIAGNOSIS — E785 Hyperlipidemia, unspecified: Secondary | ICD-10-CM | POA: Diagnosis not present

## 2016-05-05 NOTE — Progress Notes (Signed)
Cardiology Office Note   Date:  05/05/2016   ID:  Angela, Barber 03-19-1936, MRN [id number]  PCP:  Hoyt Koch, MD  Cardiologist:   Skeet Latch, MD   Chief Complaint  Patient presents with  . new patient eval    former [name] seen dec. 2014  pt states no Sx.      History of Present Illness: Angela Barber is a 80 y.o. female with a cochlear implant and hyperlipidemia who presents for follow up.  Angela Barber was previously a patient of Dr. Mare Ferrari.  She was hospitalized 03/20/16 for chest pain.  At the time she noted central chest pressure that was associated with nausea and difficulty breathing.  Cardiac enzymes were negative and her EKG was not concerning for ischemia.  She had a mild leukocytosis.  She had an echo that revealed LVEF 55-60% and no wall motion abnormalities.  She had a  Lexiscan Myoview 04/01/16 that was negative for ischemia.  Since being seen in the hospital Angela Barber has been feeling well.  She denies any recurrent chest pain or shortness of breath.  She has not noted lower extremity edema, orthopnea or PND.  She does have occasional pain in her R thigh with walking but denies claudication.    She has been seeing an orthopedic surgeon due to bilateral knee pain.  Her knee pain has improved significantly with injections.   She is not exercising much now but plans to get in water aerobics through the [employer] program.  She is also limited mobility of bilateral shoulders.  She has known that her cholesterol is elevated but never made any concerted efforts to improve it or took any medications.  She has a brother in law who died of cirrhosis after taking a statin so she is afraid of these medications.   Past Medical History:  Diagnosis Date  . Anxiety   . C. difficile diarrhea 9/06  . Cochlear implant status   . Depression   . Diverticulosis   . Hearing loss of both ears   . HLD (hyperlipidemia)   . Hyperplastic colon polyp   .  Insomnia   . Internal hemorrhoid   . Osteoarthritis, knee     Past Surgical History:  Procedure Laterality Date  . BUNIONECTOMY     right  . COCHLEAR IMPLANT  July '13   right ear. Dr. Idelle Crouch  . ROTATOR CUFF REPAIR Left 2012  . ROTATOR CUFF REPAIR Right 2007 & 2009  . TONSILLECTOMY    . VAGINAL HYSTERECTOMY       Current Outpatient Prescriptions  Medication Sig Dispense Refill  . acetaminophen (TYLENOL) 500 MG tablet Take 500 mg by mouth every 6 (six) hours as needed for mild pain.    Marland Kitchen aspirin 81 MG tablet Take 81 mg by mouth daily.    . bifidobacterium infantis (ALIGN) capsule Take 1 capsule by mouth daily.    . Cholecalciferol (VITAMIN D PO) Take 1 tablet by mouth daily.    . diphenhydramine-acetaminophen (TYLENOL PM) 25-500 MG TABS tablet Take 1 tablet by mouth at bedtime as needed (sleep).    . Misc Natural Products (TART CHERRY ADVANCED) CAPS Take 2 capsules by mouth daily.    . nitroGLYCERIN (NITROSTAT) 0.4 MG SL tablet Place 1 tablet (0.4 mg total) under the tongue every 5 (five) minutes as needed for chest pain. 20 tablet 12  . TURMERIC PO Take 3 capsules by mouth daily.  No current facility-administered medications for this visit.     Allergies:   Review of patient's allergies indicates no known allergies.    Social History:  The patient  reports that she has quit smoking. She has never used smokeless tobacco. She reports that she drinks alcohol. She reports that she does not use drugs.   Family History:  The patient's family history includes Anxiety disorder in her daughter; Breast cancer in her maternal aunt; Ovarian cancer in her mother; Stroke in her sister; Transient ischemic attack in her mother.    ROS:  Please see the history of present illness.   Otherwise, review of systems are positive for none.   All other systems are reviewed and negative.    PHYSICAL EXAM: VS:  BP 130/80 (BP Location: Left Arm, Patient Position: Sitting, Cuff Size: Normal)    Pulse 81   Ht 5\' 3"  (1.6 m)   Wt 150 lb 6.4 oz (68.2 kg)   BMI 26.64 kg/m  , BMI Body mass index is 26.64 kg/m. GENERAL:  Well appearing HEENT:  Pupils equal round and reactive, fundi not visualized, oral mucosa unremarkable NECK:  No jugular venous distention, waveform within normal limits, carotid upstroke brisk and symmetric, no bruits, no thyromegaly LYMPHATICS:  No cervical adenopathy LUNGS:  Clear to auscultation bilaterally HEART:  RRR.  PMI not displaced or sustained,S1 and S2 within normal limits, no S3, no S4, no clicks, no rubs, no murmurs ABD:  Flat, positive bowel sounds normal in frequency in pitch, no bruits, no rebound, no guarding, no midline pulsatile mass, no hepatomegaly, no splenomegaly EXT:  2 plus pulses throughout, no edema, no cyanosis no clubbing SKIN:  No rashes no nodules NEURO:  Cranial nerves II through XII grossly intact, motor grossly intact throughout PSYCH:  Cognitively intact, oriented to person place and time    EKG:  EKG is ordered today. The ekg ordered today demonstrates sinus rhythm rate 81 bpm.  Low voltage precordial leads.  Echo 03/20/16: Study Conclusions  - Left ventricle: The cavity size was normal. Wall thickness was   increased in a pattern of mild LVH. Systolic function was normal.   The estimated ejection fraction was in the range of 55% to 60%.   Wall motion was normal; there were no regional wall motion   abnormalities. Doppler parameters are consistent with abnormal   left ventricular relaxation (grade 1 diastolic dysfunction). The   E/e&' ratio is between 8-15, suggesting indeterminate LV filling   pressure. - Mitral valve: Calcified annulus. Mildly thickened leaflets .   There was trivial regurgitation. - Left atrium: The atrium was normal in size. - Tricuspid valve: There was mild regurgitation. - Pulmonary arteries: PA peak pressure: 23 mm Hg (S). - Inferior vena cava: The vessel was normal in size. The   respirophasic  diameter changes were in the normal range (>= 50%),   consistent with normal central venous pressure.  Impressions:  - LVEF 55-60%, mild LVH, normal wall motion, diastolic dysfunction,   indeterminate LV filling pressure, normal LA size, mild TR, RVSP   23 mmHg, normal IVC.  Lexiscan Myoview 04/01/16:  Nuclear stress EF: 85%. vigorous LV function  There was no ST segment deviation noted during stress.  The study is normal. No evidence of ischemia or previous myocardial infarction .  This is a low risk study.    Recent Labs: 05/09/2015: ALT 15 03/20/2016: B Natriuretic Peptide 60.1; BUN 19; Creatinine, Ser 0.70; Hemoglobin 16.6; Platelets 291; Potassium 3.8; Sodium  139    Lipid Panel    Component Value Date/Time   CHOL 228 (H) 09/25/2014 0843   TRIG 166.0 (H) 09/25/2014 0843   HDL 43.60 09/25/2014 0843   CHOLHDL 5 09/25/2014 0843   VLDL 33.2 09/25/2014 0843   LDLCALC 151 (H) 09/25/2014 0843      Wt Readings from Last 3 Encounters:  05/05/16 150 lb 6.4 oz (68.2 kg)  04/10/16 149 lb (67.6 kg)  04/01/16 152 lb (68.9 kg)      ASSESSMENT AND PLAN:  # Hyperlipidemia:  ASCVD 10 year risk is 21%.  She is reluctant to take statins due to a family member who died of cirrhosis.  She will work on her diet and exercise.  We will check fasting lipids now and in 3 months prior to determining a treatment plan.  # Chest pain: Resolved.  Lexiscan Myoview negative for ischemia.   Current medicines are reviewed at length with the patient today.  The patient does not have concerns regarding medicines.  The following changes have been made:  no change  Labs/ tests ordered today include:   Orders Placed This Encounter  Procedures  . Lipid panel  . Comprehensive metabolic panel  . EKG 12-Lead     Disposition:   FU with Lawyer Washabaugh C. Oval Linsey, MD, [facility] in 3 months.     This note was written with the assistance of speech recognition software.  Please excuse any transcriptional  errors.  Signed, Gaius Ishaq C. Oval Linsey, MD, [facility]  05/05/2016 9:54 AM    [facility]

## 2016-05-05 NOTE — Patient Instructions (Addendum)
Medication Instructions:  Your physician recommends that you continue on your current medications as directed. Please refer to the Current Medication list given to you today.  Labwork: Lp/cmet at [facility] lab on the first floor   Recheck fasting labs again a few days prior to your follow up office visit in three months  Testing/Procedures: none  Follow-Up: Your physician recommends that you schedule a follow-up appointment in: 3 month ov  If you need a refill on your cardiac medications before your next appointment, please call your pharmacy.

## 2016-05-05 NOTE — Addendum Note (Signed)
Addended by: Alvina Filbert B on: 05/05/2016 10:32 AM   Modules accepted: Orders

## 2016-05-06 LAB — COMPREHENSIVE METABOLIC PANEL
ALT: 14 U/L (ref 6–29)
AST: 18 U/L (ref 10–35)
Albumin: 4.3 g/dL (ref 3.6–5.1)
Alkaline Phosphatase: 53 U/L (ref 33–130)
BILIRUBIN TOTAL: 0.5 mg/dL (ref 0.2–1.2)
BUN: 18 mg/dL (ref 7–25)
CALCIUM: 9.6 mg/dL (ref 8.6–10.4)
CO2: 29 mmol/L (ref 20–31)
CREATININE: 0.85 mg/dL (ref 0.60–0.93)
Chloride: 103 mmol/L (ref 98–110)
GLUCOSE: 87 mg/dL (ref 65–99)
POTASSIUM: 5.1 mmol/L (ref 3.5–5.3)
Sodium: 141 mmol/L (ref 135–146)
Total Protein: 6.3 g/dL (ref 6.1–8.1)

## 2016-05-06 LAB — LIPID PANEL
CHOL/HDL RATIO: 3.6 ratio (ref <=5.0)
CHOLESTEROL: 206 mg/dL — AB (ref 125–200)
HDL: 57 mg/dL (ref >=46)
LDL CALC: 118 mg/dL (ref <130)
TRIGLYCERIDES: 157 mg/dL — AB (ref <150)
VLDL: 31 mg/dL — AB (ref <30)

## 2016-05-15 ENCOUNTER — Telehealth: Payer: Self-pay | Admitting: *Deleted

## 2016-05-15 DIAGNOSIS — E785 Hyperlipidemia, unspecified: Secondary | ICD-10-CM

## 2016-05-15 DIAGNOSIS — Z79899 Other long term (current) drug therapy: Secondary | ICD-10-CM

## 2016-05-15 NOTE — Progress Notes (Signed)
[facility] [place],  [address] Phone: [phone] Subjective:    I'm seeing this patient by the request  of:  Hoyt Koch, MD   CC: knee pain f/u [id number]  Angela Barber is a 80 y.o. female coming in with complaint of knee pain. Found to have moderate to severe osteophytic changes on last exam. Patient was given an injection. Tolerated the procedure very well. Patient was to try conservative therapy including home exercises, icing protocol, as well as over-the-counter medications. Patient statesHe is feeling significantly better for quite some time. Patient states unfortunately she started having worsening pain again. Patient states that that has started over the course last 2 days. Patient is leaving out of town and is wondering what all she can do. Patient does not want to start the other injections June. Patient will consider it. Denies any new symptoms is worsening a previous symptoms.  Patient did have x-rays in December 2016 that were independently visualized by me. X-ray show very mild to moderate tricompartmental degenerative changes.     Past Medical History:  Diagnosis Date  . Anxiety   . C. difficile diarrhea 9/06  . Cochlear implant status   . Depression   . Diverticulosis   . Hearing loss of both ears   . HLD (hyperlipidemia)   . Hyperplastic colon polyp   . Insomnia   . Internal hemorrhoid   . Osteoarthritis, knee    Past Surgical History:  Procedure Laterality Date  . BUNIONECTOMY     right  . COCHLEAR IMPLANT  July '13   right ear. Dr. Idelle Crouch  . ROTATOR CUFF REPAIR Left 2012  . ROTATOR CUFF REPAIR Right 2007 & 2009  . TONSILLECTOMY    . VAGINAL HYSTERECTOMY     Social History   Social History  . Marital status: Widowed    Spouse name: N/A  . Number of children: 2  . Years of education: Master's   Occupational History  . retired Retired   Social History Main Topics  . Smoking  status: Former Research scientist (life sciences)  . Smokeless tobacco: Never Used  . Alcohol use Yes     Comment: 1 glass of wine a day  . Drug use: No  . Sexual activity: Not Asked   Other Topics Concern  . None   Social History Narrative   Patient is widowed and lives alone.   Patient has two adult children.   Patient is retired.   Patient has a Scientist, water quality.   Patient is right-handed.   Patient drinks two cups of coffee daily.   No Known Allergies Family History  Problem Relation Age of Onset  . Ovarian cancer Mother   . Transient ischemic attack Mother   . Breast cancer Maternal Aunt     aunts  . Stroke Sister   . Anxiety disorder Daughter     Past medical history, social, surgical and family history all reviewed in electronic medical record.  No pertanent information unless stated regarding to the chief complaint.   Review of Systems: No headache, visual changes, nausea, vomiting, diarrhea, constipation, dizziness, abdominal pain, skin rash, fevers, chills, night sweats, weight loss, swollen lymph nodes, body aches, joint swelling, muscle aches, chest pain, shortness of breath, mood changes.   Objective  Blood pressure 128/76, pulse 86, weight 148 lb (67.1 kg), SpO2 96 %.  General: No apparent distress alert and oriented x3 mood and affect normal, dressed appropriately.  HEENT: Pupils equal,  extraocular movements intact  Respiratory: Patient's speak in full sentences and does not appear short of breath  Cardiovascular: No lower extremity edema, non tender, no erythema  Skin: Warm dry intact with no signs of infection or rash on extremities or on axial skeleton.  Abdomen: Soft nontender  Neuro: Cranial nerves II through XII are intact, neurovascularly intact in all extremities with 2+ DTRs and 2+ pulses.  Lymph: No lymphadenopathy of posterior or anterior cervical chain or axillae bilaterally.  Gait mild antalgic gait MSK:  Non tender with full range of motion and good stability and symmetric  strength and tone of shoulders, elbows, wrist, hip, and ankles bilaterally.  Knee:left Valgus deformity noted with significant arthritic changes of the medial compartment enlarged and to calf ratio  Tender to palpation over the medial joint line Only ROM full in flexion and extension and lower leg rotation. Mild instability with valgus force Negative Mcmurray's, Apley's, and Thessalonian tests. Mild painful patellar compression. Patellar glide mild crepitus. Patellar and quadriceps tendons unremarkable. Hamstring and quadriceps strength is normal.  Contralateral knee also has significant arthritic changes with valgus instability       Impression and Recommendations:     This case required medical decision making of moderate complexity.      Note: This dictation was prepared with Dragon dictation along with smaller phrase technology. Any transcriptional errors that result from this process are unintentional.

## 2016-05-15 NOTE — Telephone Encounter (Signed)
-----   Message from Skeet Latch, MD sent at 05/15/2016  1:40 PM EDT ----- Cholesterol levels are still high and indicate that she is at a 22% risk of heart attack or stroke in the next 10 years if we do not address this.  Continue with plan to increase exercise and work on diet and we will repeat them in 3 months.

## 2016-05-15 NOTE — Telephone Encounter (Signed)
Results called to pt. Pt verbalized understanding. Patient expounded and said she has signed up at the [facility] for water aerobics and group exercise. She is also willing to follow the instructions on improving her diet. Repeat labs entered and will be mailed to patient with instructions to repeat in 3 months. She verbalized understanding.

## 2016-05-16 ENCOUNTER — Encounter: Payer: Self-pay | Admitting: Family Medicine

## 2016-05-16 ENCOUNTER — Ambulatory Visit (INDEPENDENT_AMBULATORY_CARE_PROVIDER_SITE_OTHER): Payer: Medicare Other | Admitting: Family Medicine

## 2016-05-16 ENCOUNTER — Ambulatory Visit: Payer: Self-pay | Admitting: Internal Medicine

## 2016-05-16 DIAGNOSIS — M1712 Unilateral primary osteoarthritis, left knee: Secondary | ICD-10-CM | POA: Diagnosis not present

## 2016-05-16 MED ORDER — DICLOFENAC SODIUM 1 % TD GEL: 2.0000 g | Freq: Four times a day (QID) | TRANSDERMAL | 3 refills | Status: DC

## 2016-05-16 NOTE — Assessment & Plan Note (Signed)
Discussed with patient at great length. Patient seems to be doing a little better overall but not severely. We did discuss the possibility of viscous supplementation which patient declined at the moment. We discussed icing regimen. We discussed which activities to do an which was potentially avoid. Patient will come back when she feels confident in the potential to start viscous supplementation. Decline formal physical therapy but she said she will start going to the gym on a more regular basis.  Spent  25 minutes with patient face-to-face and had greater than 50% of counseling including as described above in assessment and plan.

## 2016-05-16 NOTE — Patient Instructions (Signed)
Good to see you  Ice is your friend Ice 20 minutes 2 times daily. Usually after activity and before bed. Voltaren gel 2 times a day  See me again in when you are back and we will do orthovisc.

## 2016-05-19 NOTE — Progress Notes (Deleted)
[name] [address] [place], [place] [address] Phone: [phone] Subjective:    I'm seeing this patient by the request  of:  Hoyt Koch, MD   CC: knee pain f/u [id number]  Angela Barber is a 80 y.o. female coming in with complaint of knee pain. Found to have moderate to severe osteophytic changes on last exam. Patient was given an injection. Tolerated the procedure very well. Patient was to try conservative therapy including home exercises, icing protocol, as well as over-the-counter medications. Patient statesHe is feeling significantly better for quite some time. Patient states unfortunately she started having worsening pain again. Patient states that that has started over the course last 2 days. Patient is leaving out of town and is wondering what all she can do. Patient does not want to start the other injections June. Patient will consider it. Denies any new symptoms is worsening a previous symptoms.  Patient did have x-rays in December 2016 that were independently visualized by me. X-ray show very mild to moderate tricompartmental degenerative changes.     Past Medical History:  Diagnosis Date  . Anxiety   . C. difficile diarrhea 9/06  . Cochlear implant status   . Depression   . Diverticulosis   . Hearing loss of both ears   . HLD (hyperlipidemia)   . Hyperplastic colon polyp   . Insomnia   . Internal hemorrhoid   . Osteoarthritis, knee    Past Surgical History:  Procedure Laterality Date  . BUNIONECTOMY     right  . COCHLEAR IMPLANT  July '13   right ear. Dr. Idelle Crouch  . ROTATOR CUFF REPAIR Left 2012  . ROTATOR CUFF REPAIR Right 2007 & 2009  . TONSILLECTOMY    . VAGINAL HYSTERECTOMY     Social History   Social History  . Marital status: Widowed    Spouse name: N/A  . Number of children: 2  . Years of education: Master's   Occupational History  . retired Retired   Social History Main Topics  . Smoking  status: Former Research scientist (life sciences)  . Smokeless tobacco: Never Used  . Alcohol use Yes     Comment: 1 glass of wine a day  . Drug use: No  . Sexual activity: Not on file   Other Topics Concern  . Not on file   Social History Narrative   Patient is widowed and lives alone.   Patient has two adult children.   Patient is retired.   Patient has a Scientist, water quality.   Patient is right-handed.   Patient drinks two cups of coffee daily.   No Known Allergies Family History  Problem Relation Age of Onset  . Ovarian cancer Mother   . Transient ischemic attack Mother   . Breast cancer Maternal Aunt     aunts  . Stroke Sister   . Anxiety disorder Daughter     Past medical history, social, surgical and family history all reviewed in electronic medical record.  No pertanent information unless stated regarding to the chief complaint.   Review of Systems: No headache, visual changes, nausea, vomiting, diarrhea, constipation, dizziness, abdominal pain, skin rash, fevers, chills, night sweats, weight loss, swollen lymph nodes, body aches, joint swelling, muscle aches, chest pain, shortness of breath, mood changes.   Objective  There were no vitals taken for this visit.  General: No apparent distress alert and oriented x3 mood and affect normal, dressed appropriately.  HEENT: Pupils equal, extraocular movements  intact  Respiratory: Patient's speak in full sentences and does not appear short of breath  Cardiovascular: No lower extremity edema, non tender, no erythema  Skin: Warm dry intact with no signs of infection or rash on extremities or on axial skeleton.  Abdomen: Soft nontender  Neuro: Cranial nerves II through XII are intact, neurovascularly intact in all extremities with 2+ DTRs and 2+ pulses.  Lymph: No lymphadenopathy of posterior or anterior cervical chain or axillae bilaterally.  Gait mild antalgic gait MSK:  Non tender with full range of motion and good stability and symmetric strength and tone  of shoulders, elbows, wrist, hip, and ankles bilaterally.  Knee:left Valgus deformity noted with significant arthritic changes of the medial compartment enlarged and to calf ratio  Tender to palpation over the medial joint line Only ROM full in flexion and extension and lower leg rotation. Mild instability with valgus force Negative Mcmurray's, Apley's, and Thessalonian tests. Mild painful patellar compression. Patellar glide mild crepitus. Patellar and quadriceps tendons unremarkable. Hamstring and quadriceps strength is normal.  Contralateral knee also has significant arthritic changes with valgus instability       Impression and Recommendations:     This case required medical decision making of moderate complexity.      Note: This dictation was prepared with Dragon dictation along with smaller phrase technology. Any transcriptional errors that result from this process are unintentional.

## 2016-05-20 ENCOUNTER — Ambulatory Visit: Payer: Medicare Other | Admitting: Family Medicine

## 2016-06-03 NOTE — Progress Notes (Signed)
[facility] [address] [place], [place] [address] Phone: [phone] Subjective:    I'm seeing this patient by the request  of:  Hoyt Koch, MD   CC: knee pain f/u Now bilaterally [id number]  Angela Barber is a 80 y.o. female coming in with complaint of knee pain. Found to have moderate to severe osteophytic changes on last exam. Patient was given an injection. States that the pain is slowly started to come back again at this time. Patient states that she continues having spotty. Having worsening pain of the right knee as well.  Patient did have x-rays in December 2016 that were independently visualized by me. X-ray show very mild to moderate tricompartmental degenerative changes.     Past Medical History:  Diagnosis Date  . Anxiety   . C. difficile diarrhea 9/06  . Cochlear implant status   . Depression   . Diverticulosis   . Hearing loss of both ears   . HLD (hyperlipidemia)   . Hyperplastic colon polyp   . Insomnia   . Internal hemorrhoid   . Osteoarthritis, knee    Past Surgical History:  Procedure Laterality Date  . BUNIONECTOMY     right  . COCHLEAR IMPLANT  July '13   right ear. Dr. Idelle Crouch  . ROTATOR CUFF REPAIR Left 2012  . ROTATOR CUFF REPAIR Right 2007 & 2009  . TONSILLECTOMY    . VAGINAL HYSTERECTOMY     Social History   Social History  . Marital status: Widowed    Spouse name: N/A  . Number of children: 2  . Years of education: Master's   Occupational History  . retired Retired   Social History Main Topics  . Smoking status: Former Research scientist (life sciences)  . Smokeless tobacco: Never Used  . Alcohol use Yes     Comment: 1 glass of wine a day  . Drug use: No  . Sexual activity: Not Asked   Other Topics Concern  . None   Social History Narrative   Patient is widowed and lives alone.   Patient has two adult children.   Patient is retired.   Patient has a Scientist, water quality.   Patient is right-handed.   Patient  drinks two cups of coffee daily.   No Known Allergies Family History  Problem Relation Age of Onset  . Ovarian cancer Mother   . Transient ischemic attack Mother   . Breast cancer Maternal Aunt     aunts  . Stroke Sister   . Anxiety disorder Daughter     Past medical history, social, surgical and family history all reviewed in electronic medical record.  No pertanent information unless stated regarding to the chief complaint.   Review of Systems: No headache, visual changes, nausea, vomiting, diarrhea, constipation, dizziness, abdominal pain, skin rash, fevers, chills, night sweats, weight loss, swollen lymph nodes, body aches, joint swelling, muscle aches, chest pain, shortness of breath, mood changes.   Objective  Blood pressure 112/72, pulse 96, weight 148 lb (67.1 kg), SpO2 97 %.  General: No apparent distress alert and oriented x3 mood and affect normal, dressed appropriately.  HEENT: Pupils equal, extraocular movements intact  Respiratory: Patient's speak in full sentences and does not appear short of breath  Cardiovascular: No lower extremity edema, non tender, no erythema  Skin: Warm dry intact with no signs of infection or rash on extremities or on axial skeleton.  Abdomen: Soft nontender  Neuro: Cranial nerves II through XII are  intact, neurovascularly intact in all extremities with 2+ DTRs and 2+ pulses.  Lymph: No lymphadenopathy of posterior or anterior cervical chain or axillae bilaterally.  Gait mild antalgic gait MSK:  Non tender with full range of motion and good stability and symmetric strength and tone of shoulders, elbows, wrist, hip, and ankles bilaterally.  Knee:left Valgus deformity noted with significant arthritic changes of the medial compartment enlarged and to calf ratio  Tender to palpation over the medial joint line Minimal change from previous exam ROM full in flexion and extension and lower leg rotation. Mild instability with valgus force Negative  Mcmurray's, Apley's, and Thessalonian tests. Mild painful patellar compression. Patellar glide mild crepitus. Patellar and quadriceps tendons unremarkable. Hamstring and quadriceps strength is normal.  Contralateral knee also has significant arthritic changes with valgus instability  After informed written and verbal consent, patient was seated on exam table. Right knee was prepped with alcohol swab and utilizing anterolateral approach, patient's right knee space was injected with 4:1  marcaine 0.5%: Kenalog 40mg /dL. Patient tolerated the procedure well without immediate complications.  After informed written and verbal consent, patient was seated on exam table. Left knee was prepped with alcohol swab and utilizing anterolateral approach, patient's left knee space was injected with 15 mg/2.5 mL of Orthovisc(sodium hyaluronate) in a prefilled syringe was injected easily into the knee through a 22-gauge needle... Patient tolerated the procedure well without immediate complications.       Impression and Recommendations:     This case required medical decision making of moderate complexity.      Note: This dictation was prepared with Dragon dictation along with smaller phrase technology. Any transcriptional errors that result from this process are unintentional.

## 2016-06-04 ENCOUNTER — Encounter: Payer: Self-pay | Admitting: Family Medicine

## 2016-06-04 ENCOUNTER — Ambulatory Visit (INDEPENDENT_AMBULATORY_CARE_PROVIDER_SITE_OTHER): Payer: Medicare Other | Admitting: Family Medicine

## 2016-06-04 DIAGNOSIS — M170 Bilateral primary osteoarthritis of knee: Secondary | ICD-10-CM | POA: Diagnosis not present

## 2016-06-04 DIAGNOSIS — M1712 Unilateral primary osteoarthritis, left knee: Secondary | ICD-10-CM | POA: Diagnosis not present

## 2016-06-04 DIAGNOSIS — M1711 Unilateral primary osteoarthritis, right knee: Secondary | ICD-10-CM | POA: Diagnosis not present

## 2016-06-04 NOTE — Assessment & Plan Note (Signed)
Given injection today and tolerated the procedure well. We discussed icing regimen and home exercises. We discussed which activities to do a which was to avoid. Encourage her to continue the over-the-counter medications. If having worsening symptoms of follow-up we'll consider possible viscous supple mentation in this knee as well.

## 2016-06-04 NOTE — Patient Instructions (Addendum)
Good to see you  Ice is your friend Continue the vitamins Stay active  Injected the right knee today with a steroid Injected the left knee with orthovisc.  We will see you next week for 2nd injection in the left knee.

## 2016-06-04 NOTE — Assessment & Plan Note (Signed)
Patient did not do well with conservative therapy and has exhausted all options. Started with viscous supplementation. Patient once avoid any surgical intervention. Patient will start with viscous supple mentation today. Patient will have a series of 4 injections. Will come back in 1 week as she continues conservative therapy otherwise. Declined custom bracing at this moment.

## 2016-06-10 NOTE — Assessment & Plan Note (Signed)
Given second in a series of 4 patient will continue conservative therapy. Patient did not want a custom brace. We discussed icing regimen and topical anti-inflammatories again. Follow-up in one week for third in a series of 4 injections.

## 2016-06-10 NOTE — Progress Notes (Signed)
[name] [name] [place], [place] [address] Phone: [phone] Subjective:    I'm seeing this patient by the request  of:  Hoyt Koch, MD   CC: knee pain f/u Now bilaterally [id number]  Angela Barber is a 80 y.o. female coming in with complaint of knee pain. Found to have moderate to severe osteophytic changes on last exam. Patient was given an injection. failedconservative therapy. Patient was started on viscous supplementation. Patient is now here for the second in a series of 4 injections on the left knee.. Patient states Patient was having worsening pain in the right knee and was given a corticosteroid injection 1 week ago. Patient states right knee much better.    Patient did have x-rays in December 2016 that were independently visualized by me. X-ray show very mild to moderate tricompartmental degenerative changes.     Past Medical History:  Diagnosis Date  . Anxiety   . C. difficile diarrhea 9/06  . Cochlear implant status   . Depression   . Diverticulosis   . Hearing loss of both ears   . HLD (hyperlipidemia)   . Hyperplastic colon polyp   . Insomnia   . Internal hemorrhoid   . Osteoarthritis, knee    Past Surgical History:  Procedure Laterality Date  . BUNIONECTOMY     right  . COCHLEAR IMPLANT  July '13   right ear. Dr. Idelle Crouch  . ROTATOR CUFF REPAIR Left 2012  . ROTATOR CUFF REPAIR Right 2007 & 2009  . TONSILLECTOMY    . VAGINAL HYSTERECTOMY     Social History   Social History  . Marital status: Widowed    Spouse name: N/A  . Number of children: 2  . Years of education: Master's   Occupational History  . retired Retired   Social History Main Topics  . Smoking status: Former Research scientist (life sciences)  . Smokeless tobacco: Never Used  . Alcohol use Yes     Comment: 1 glass of wine a day  . Drug use: No  . Sexual activity: Not on file   Other Topics Concern  . Not on file   Social History Narrative   Patient is widowed and lives alone.   Patient has two adult children.   Patient is retired.   Patient has a Scientist, water quality.   Patient is right-handed.   Patient drinks two cups of coffee daily.   No Known Allergies Family History  Problem Relation Age of Onset  . Ovarian cancer Mother   . Transient ischemic attack Mother   . Breast cancer Maternal Aunt     aunts  . Stroke Sister   . Anxiety disorder Daughter     Past medical history, social, surgical and family history all reviewed in electronic medical record.  No pertanent information unless stated regarding to the chief complaint.   Review of Systems: No headache, visual changes, nausea, vomiting, diarrhea, constipation, dizziness, abdominal pain, skin rash, fevers, chills, night sweats, weight loss, swollen lymph nodes, body aches, joint swelling, muscle aches, chest pain, shortness of breath, mood changes.   Objective  There were no vitals taken for this visit.  General: No apparent distress alert and oriented x3 mood and affect normal, dressed appropriately.  HEENT: Pupils equal, extraocular movements intact  Respiratory: Patient's speak in full sentences and does not appear short of breath  Cardiovascular: No lower extremity edema, non tender, no erythema  Skin: Warm dry intact with no signs of infection  or rash on extremities or on axial skeleton.  Abdomen: Soft nontender  Neuro: Cranial nerves II through XII are intact, neurovascularly intact in all extremities with 2+ DTRs and 2+ pulses.  Lymph: No lymphadenopathy of posterior or anterior cervical chain or axillae bilaterally.  Gait mild antalgic gait MSK:  Non tender with full range of motion and good stability and symmetric strength and tone of shoulders, elbows, wrist, hip, and ankles bilaterally.  Knee:left Valgus deformity noted with significant arthritic changes of the medial compartment enlarged and to calf ratio  Tender to palpation over the medial joint line  Minimal change from previous exam ROM full in flexion and extension and lower leg rotation. Mild instability with valgus force Negative Mcmurray's, Apley's, and Thessalonian tests. Mild painful patellar compression maybe a little improvement.  Patellar glide mild crepitus. Patellar and quadriceps tendons unremarkable. Hamstring and quadriceps strength is normal.  Contralateral knee also has significant arthritic changes with valgus instability but not tender.   After informed written and verbal consent, patient was seated on exam table. Left knee was prepped with alcohol swab and utilizing anterolateral approach, patient's left knee space was injected with 15 mg/2.5 mL of Orthovisc(sodium hyaluronate) in a prefilled syringe was injected easily into the knee through a 22-gauge needle... Patient tolerated the procedure well without immediate complications.       Impression and Recommendations:     This case required medical decision making of moderate complexity.      Note: This dictation was prepared with Dragon dictation along with smaller phrase technology. Any transcriptional errors that result from this process are unintentional.

## 2016-06-11 ENCOUNTER — Ambulatory Visit (INDEPENDENT_AMBULATORY_CARE_PROVIDER_SITE_OTHER): Payer: Medicare Other | Admitting: Family Medicine

## 2016-06-11 ENCOUNTER — Encounter: Payer: Self-pay | Admitting: Family Medicine

## 2016-06-11 DIAGNOSIS — M1711 Unilateral primary osteoarthritis, right knee: Secondary | ICD-10-CM

## 2016-06-11 DIAGNOSIS — M1712 Unilateral primary osteoarthritis, left knee: Secondary | ICD-10-CM | POA: Diagnosis not present

## 2016-06-11 NOTE — Patient Instructions (Addendum)
Good to see you  Keep doing everything else you are doing 2 down and 2 to go on the left knee.  You are doing well overall Ice is your friend See you again for #3 soon!

## 2016-06-11 NOTE — Assessment & Plan Note (Signed)
Stable after last injection

## 2016-06-18 ENCOUNTER — Ambulatory Visit (INDEPENDENT_AMBULATORY_CARE_PROVIDER_SITE_OTHER): Payer: Medicare Other | Admitting: Family Medicine

## 2016-06-18 ENCOUNTER — Encounter: Payer: Self-pay | Admitting: Family Medicine

## 2016-06-18 DIAGNOSIS — M1712 Unilateral primary osteoarthritis, left knee: Secondary | ICD-10-CM | POA: Diagnosis not present

## 2016-06-18 NOTE — Patient Instructions (Addendum)
3 down and one to go.  Ice is your friend See me next week

## 2016-06-18 NOTE — Progress Notes (Signed)
[facility] [address] [place], [place] [address] Phone: [phone] Subjective:    I'm seeing this patient by the request  of:  Hoyt Koch, MD   CC: knee pain f/u Now bilaterally [id number]  Angela Barber is a 80 y.o. female coming in with complaint of knee pain. Found to have moderate to severe osteophytic changes on last exam. Patient was given an injection. failedconservative therapy. Patient was started on viscous supplementation. Patient is now here for the 3rd injection. Patient states that she is doing better. States that she is having more better days and worse days.      Patient did have x-rays in December 2016 that were independently visualized by me. X-ray show very mild to moderate tricompartmental degenerative changes.     Past Medical History:  Diagnosis Date  . Anxiety   . C. difficile diarrhea 9/06  . Cochlear implant status   . Depression   . Diverticulosis   . Hearing loss of both ears   . HLD (hyperlipidemia)   . Hyperplastic colon polyp   . Insomnia   . Internal hemorrhoid   . Osteoarthritis, knee    Past Surgical History:  Procedure Laterality Date  . BUNIONECTOMY     right  . COCHLEAR IMPLANT  July '13   right ear. Dr. Idelle Crouch  . ROTATOR CUFF REPAIR Left 2012  . ROTATOR CUFF REPAIR Right 2007 & 2009  . TONSILLECTOMY    . VAGINAL HYSTERECTOMY     Social History   Social History  . Marital status: Widowed    Spouse name: N/A  . Number of children: 2  . Years of education: Master's   Occupational History  . retired Retired   Social History Main Topics  . Smoking status: Former Research scientist (life sciences)  . Smokeless tobacco: Never Used  . Alcohol use Yes     Comment: 1 glass of wine a day  . Drug use: No  . Sexual activity: Not Asked   Other Topics Concern  . None   Social History Narrative   Patient is widowed and lives alone.   Patient has two adult children.   Patient is retired.   Patient has a  Scientist, water quality.   Patient is right-handed.   Patient drinks two cups of coffee daily.   No Known Allergies Family History  Problem Relation Age of Onset  . Ovarian cancer Mother   . Transient ischemic attack Mother   . Breast cancer Maternal Aunt     aunts  . Stroke Sister   . Anxiety disorder Daughter     Past medical history, social, surgical and family history all reviewed in electronic medical record.  No pertanent information unless stated regarding to the chief complaint.   Review of Systems: No headache, visual changes, nausea, vomiting, diarrhea, constipation, dizziness, abdominal pain, skin rash, fevers, chills, night sweats, weight loss, swollen lymph nodes, body aches, joint swelling, muscle aches, chest pain, shortness of breath, mood changes.   Objective  Blood pressure 100/70, pulse 100, SpO2 99 %.  General: No apparent distress alert and oriented x3 mood and affect normal, dressed appropriately.  HEENT: Pupils equal, extraocular movements intact  Respiratory: Patient's speak in full sentences and does not appear short of breath  Cardiovascular: No lower extremity edema, non tender, no erythema  Skin: Warm dry intact with no signs of infection or rash on extremities or on axial skeleton.  Abdomen: Soft nontender  Neuro: Cranial nerves II  through XII are intact, neurovascularly intact in all extremities with 2+ DTRs and 2+ pulses.  Lymph: No lymphadenopathy of posterior or anterior cervical chain or axillae bilaterally.  Gait mild antalgic gait MSK:  Non tender with full range of motion and good stability and symmetric strength and tone of shoulders, elbows, wrist, hip, and ankles bilaterally.  Knee:left Valgus deformity noted with significant arthritic changes of the medial compartment enlarged and to calf ratio  Tender to palpation over the medial joint line Minimal change from previous exam ROM full in flexion and extension and lower leg rotation. Mild instability  with valgus force Negative Mcmurray's, Apley's, and Thessalonian tests. Mild painful patellar compression maybe a little improvement.  Patellar glide mild crepitus. Patellar and quadriceps tendons unremarkable. Hamstring and quadriceps strength is normal.  Contralateral knee also has significant arthritic changes with valgus instability but not tender.   After informed written and verbal consent, patient was seated on exam table. Left knee was prepped with alcohol swab and utilizing anterolateral approach, patient's left knee space was injected with 15 mg/2.5 mL of Orthovisc(sodium hyaluronate) in a prefilled syringe was injected easily into the knee through a 22-gauge needle... Patient tolerated the procedure well without immediate complications.       Impression and Recommendations:     This case required medical decision making of moderate complexity.      Note: This dictation was prepared with Dragon dictation along with smaller phrase technology. Any transcriptional errors that result from this process are unintentional.

## 2016-06-18 NOTE — Assessment & Plan Note (Signed)
3 out of 4 injections given. Patient will be returning for fourth injection.patient and will finish her surgeries in 1 week.

## 2016-06-19 ENCOUNTER — Ambulatory Visit (INDEPENDENT_AMBULATORY_CARE_PROVIDER_SITE_OTHER): Payer: Medicare Other

## 2016-06-19 DIAGNOSIS — Z23 Encounter for immunization: Secondary | ICD-10-CM

## 2016-06-24 NOTE — Assessment & Plan Note (Signed)
Final viscous supplementation injection given today. Tolerated procedure well. We discussed patient as long she does well she can follow-up as needed otherwise a forcing symptoms return and we'll consider steroid injections or send patient for further surgical intervention.

## 2016-06-24 NOTE — Progress Notes (Signed)
[facility], [place] [address] Phone: [phone] Subjective:    I'm seeing this patient by the request  of:  Hoyt Koch, MD   CC: knee pain f/u Now bilaterally [id number]  Angela Barber is a 80 y.o. female coming in with complaint of knee pain. Found to have moderate to severe osteophytic changes. Patient was given an injection. failedconservative therapy. Patient was started on viscous supplementation. Patient is now here for the 3rd injection. Patient states that she is doing better. Still having more soreness over the medial aspect of the knee.  Past imaging: Patient did have x-rays in December 2016 that were independently visualized by me. X-ray show very mild to moderate tricompartmental degenerative changes. Patient on ultrasound shows severe arthritic changes     Past Medical History:  Diagnosis Date  . Anxiety   . C. difficile diarrhea 9/06  . Cochlear implant status   . Depression   . Diverticulosis   . Hearing loss of both ears   . HLD (hyperlipidemia)   . Hyperplastic colon polyp   . Insomnia   . Internal hemorrhoid   . Osteoarthritis, knee    Past Surgical History:  Procedure Laterality Date  . BUNIONECTOMY     right  . COCHLEAR IMPLANT  July '13   right ear. Dr. Idelle Crouch  . ROTATOR CUFF REPAIR Left 2012  . ROTATOR CUFF REPAIR Right 2007 & 2009  . TONSILLECTOMY    . VAGINAL HYSTERECTOMY     Social History   Social History  . Marital status: Widowed    Spouse name: N/A  . Number of children: 2  . Years of education: Master's   Occupational History  . retired Retired   Social History Main Topics  . Smoking status: Former Research scientist (life sciences)  . Smokeless tobacco: Never Used  . Alcohol use Yes     Comment: 1 glass of wine a day  . Drug use: No  . Sexual activity: Not on file   Other Topics Concern  . Not on file   Social History Narrative   Patient is widowed and lives alone.   Patient  has two adult children.   Patient is retired.   Patient has a Scientist, water quality.   Patient is right-handed.   Patient drinks two cups of coffee daily.   No Known Allergies Family History  Problem Relation Age of Onset  . Ovarian cancer Mother   . Transient ischemic attack Mother   . Breast cancer Maternal Aunt     aunts  . Stroke Sister   . Anxiety disorder Daughter     Past medical history, social, surgical and family history all reviewed in electronic medical record.  No pertanent information unless stated regarding to the chief complaint.   Review of Systems: No headache, visual changes, nausea, vomiting, diarrhea, constipation, dizziness, abdominal pain, skin rash, fevers, chills, night sweats, weight loss, swollen lymph nodes, body aches, joint swelling, muscle aches, chest pain, shortness of breath, mood changes.   Objective  There were no vitals taken for this visit.  General: No apparent distress alert and oriented x3 mood and affect normal, dressed appropriately.  HEENT: Pupils equal, extraocular movements intact  Respiratory: Patient's speak in full sentences and does not appear short of breath  Cardiovascular: No lower extremity edema, non tender, no erythema  Skin: Warm dry intact with no signs of infection or rash on extremities or on axial skeleton.  Abdomen: Soft nontender  Neuro: Cranial nerves II through XII are intact, neurovascularly intact in all extremities with 2+ DTRs and 2+ pulses.  Lymph: No lymphadenopathy of posterior or anterior cervical chain or axillae bilaterally.  Gait mild antalgic gait MSK:  Non tender with full range of motion and good stability and symmetric strength and tone of shoulders, elbows, wrist, hip, and ankles bilaterally.  Knee:left Valgus deformity noted with significant arthritic changes of the medial compartment enlarged and to calf ratio  Tender to palpation over the medial joint line Minimal change from previous exam ROM full in  flexion and extension and lower leg rotation. Mild instability with valgus force Negative Mcmurray's, Apley's, and Thessalonian tests. Mild painful patellar compression maybe a little improvement.  Patellar glide mild crepitus. Patellar and quadriceps tendons unremarkable. Hamstring and quadriceps strength is normal.  Contralateral knee also has significant arthritic changes with valgus instability but not tender.   After informed written and verbal consent, patient was seated on exam table. Left knee was prepped with alcohol swab and utilizing anterolateral approach, patient's left knee space was injected with 15 mg/2.5 mL of Orthovisc(sodium hyaluronate) in a prefilled syringe was injected easily into the knee through a 22-gauge needle... Patient tolerated the procedure well without immediate complications.       Impression and Recommendations:     This case required medical decision making of moderate complexity.      Note: This dictation was prepared with Dragon dictation along with smaller phrase technology. Any transcriptional errors that result from this process are unintentional.

## 2016-06-25 ENCOUNTER — Ambulatory Visit (INDEPENDENT_AMBULATORY_CARE_PROVIDER_SITE_OTHER): Payer: Medicare Other | Admitting: Family Medicine

## 2016-06-25 ENCOUNTER — Encounter: Payer: Self-pay | Admitting: Family Medicine

## 2016-06-25 DIAGNOSIS — M1712 Unilateral primary osteoarthritis, left knee: Secondary | ICD-10-CM | POA: Diagnosis not present

## 2016-06-25 NOTE — Patient Instructions (Signed)
Great to see you  Last one! You get a break from me.  Stay active and continue everything you are doing.  Make an appointment in 4 weeks just in case but otherwise see me when you nee dme.  Happy holidays!

## 2016-07-22 ENCOUNTER — Encounter (HOSPITAL_COMMUNITY): Payer: Self-pay

## 2016-07-22 ENCOUNTER — Emergency Department (HOSPITAL_COMMUNITY): Payer: Medicare Other

## 2016-07-22 ENCOUNTER — Emergency Department (HOSPITAL_COMMUNITY)
Admission: EM | Admit: 2016-07-22 | Discharge: 2016-07-22 | Disposition: A | Discharge status: Home / Self Care | Payer: Medicare Other | Attending: Emergency Medicine | Admitting: Emergency Medicine

## 2016-07-22 DIAGNOSIS — R112 Nausea with vomiting, unspecified: Secondary | ICD-10-CM | POA: Diagnosis not present

## 2016-07-22 DIAGNOSIS — Z7982 Long term (current) use of aspirin: Secondary | ICD-10-CM | POA: Insufficient documentation

## 2016-07-22 DIAGNOSIS — Z87891 Personal history of nicotine dependence: Secondary | ICD-10-CM | POA: Diagnosis not present

## 2016-07-22 DIAGNOSIS — Z79899 Other long term (current) drug therapy: Secondary | ICD-10-CM | POA: Diagnosis not present

## 2016-07-22 DIAGNOSIS — R0602 Shortness of breath: Secondary | ICD-10-CM | POA: Diagnosis not present

## 2016-07-22 DIAGNOSIS — R079 Chest pain, unspecified: Secondary | ICD-10-CM | POA: Diagnosis not present

## 2016-07-22 DIAGNOSIS — R05 Cough: Secondary | ICD-10-CM | POA: Insufficient documentation

## 2016-07-22 DIAGNOSIS — R1013 Epigastric pain: Secondary | ICD-10-CM | POA: Diagnosis present

## 2016-07-22 LAB — CBC
HCT: 43.9 % (ref 36.0–46.0)
Hemoglobin: 15.5 g/dL — ABNORMAL HIGH (ref 12.0–15.0)
MCH: 32 pg (ref 26.0–34.0)
MCHC: 35.3 g/dL (ref 30.0–36.0)
MCV: 90.7 fL (ref 78.0–100.0)
PLATELETS: 298 10*3/uL (ref 150–400)
RBC: 4.84 MIL/uL (ref 3.87–5.11)
RDW: 13.2 % (ref 11.5–15.5)
WBC: 15 10*3/uL — AB (ref 4.0–10.5)

## 2016-07-22 LAB — BASIC METABOLIC PANEL
Anion gap: 10 (ref 5–15)
BUN: 19 mg/dL (ref 6–20)
CALCIUM: 9.6 mg/dL (ref 8.9–10.3)
CO2: 27 mmol/L (ref 22–32)
CREATININE: 0.8 mg/dL (ref 0.44–1.00)
Chloride: 102 mmol/L (ref 101–111)
GFR calc non Af Amer: >60 mL/min (ref >60)
Glucose, Bld: 128 mg/dL — ABNORMAL HIGH (ref 65–99)
Potassium: 3.9 mmol/L (ref 3.5–5.1)
SODIUM: 139 mmol/L (ref 135–145)

## 2016-07-22 LAB — HEPATIC FUNCTION PANEL
ALK PHOS: 51 U/L (ref 38–126)
ALT: 17 U/L (ref 14–54)
AST: 24 U/L (ref 15–41)
Albumin: 4.4 g/dL (ref 3.5–5.0)
BILIRUBIN INDIRECT: 0.6 mg/dL (ref 0.3–0.9)
BILIRUBIN TOTAL: 0.8 mg/dL (ref 0.3–1.2)
Bilirubin, Direct: 0.2 mg/dL (ref 0.1–0.5)
Total Protein: 6.8 g/dL (ref 6.5–8.1)

## 2016-07-22 LAB — I-STAT TROPONIN, ED
TROPONIN I, POC: 0 ng/mL (ref 0.00–0.08)
Troponin i, poc: 0 ng/mL (ref 0.00–0.08)

## 2016-07-22 LAB — LIPASE, BLOOD: LIPASE: 22 U/L (ref 11–51)

## 2016-07-22 MED ORDER — FAMOTIDINE IN NACL 20-0.9 MG/50ML-% IV SOLN
20.0000 mg | Freq: Two times a day (BID) | INTRAVENOUS | Status: DC
  Administered 2016-07-22: 20 mg via INTRAVENOUS
  Filled 2016-07-22: qty 50

## 2016-07-22 MED ORDER — GI COCKTAIL ~~LOC~~
30.0000 mL | Freq: Once | ORAL | Status: AC
  Administered 2016-07-22: 30 mL via ORAL
  Filled 2016-07-22: qty 30

## 2016-07-22 MED ORDER — MORPHINE SULFATE (PF) 2 MG/ML IV SOLN
2.0000 mg | Freq: Once | INTRAVENOUS | Status: AC
Start: 2016-07-22 — End: 2016-07-22
  Administered 2016-07-22: 2 mg via INTRAVENOUS
  Filled 2016-07-22: qty 1

## 2016-07-22 MED ORDER — SODIUM CHLORIDE 0.9 % IV BOLUS (SEPSIS)
500.0000 mL | Freq: Once | INTRAVENOUS | Status: AC
  Administered 2016-07-22: 500 mL via INTRAVENOUS

## 2016-07-22 MED ORDER — ONDANSETRON HCL 4 MG/2ML IJ SOLN
4.0000 mg | Freq: Once | INTRAMUSCULAR | Status: AC
  Administered 2016-07-22: 4 mg via INTRAVENOUS
  Filled 2016-07-22: qty 2

## 2016-07-22 MED ORDER — IOPAMIDOL (ISOVUE-300) INJECTION 61%
100.0000 mL | Freq: Once | INTRAVENOUS | Status: AC | PRN
  Administered 2016-07-22: 100 mL via INTRAVENOUS

## 2016-07-22 MED ORDER — ONDANSETRON HCL 4 MG PO TABS: 4.0000 mg | ORAL_TABLET | Freq: Four times a day (QID) | ORAL | 0 refills | Status: DC | PRN

## 2016-07-22 MED ORDER — PANTOPRAZOLE SODIUM 20 MG PO TBEC: 20.0000 mg | DELAYED_RELEASE_TABLET | Freq: Every day | ORAL | 0 refills | Status: DC

## 2016-07-22 MED ORDER — TRAMADOL HCL 50 MG PO TABS: 50.0000 mg | ORAL_TABLET | Freq: Four times a day (QID) | ORAL | 0 refills | Status: DC | PRN

## 2016-07-22 NOTE — ED Provider Notes (Signed)
[facility] DEPT Provider Note   CSN: [id number] Arrival date & time: 07/22/16  [id number]     History   Chief Complaint Chief Complaint  Patient presents with  . Chest Pain    HPI Angela Barber is a 80 y.o. female.  HPI Patient developed epigastric/central lower chest pain starting around midnight. Associated with nausea and multiple episodes of vomiting. She states the pain radiated to her right upper abdomen/lower chest. The pain is worse with deep breathing. No associated fever or chills. Patient does have some mild shortness of breath and occasional cough. She took 4 neck show glycerin at home with no relief of her symptoms. Past Medical History:  Diagnosis Date  . Anxiety   . C. difficile diarrhea 9/06  . Cochlear implant status   . Depression   . Diverticulosis   . Hearing loss of both ears   . HLD (hyperlipidemia)   . Hyperplastic colon polyp   . Insomnia   . Internal hemorrhoid   . Osteoarthritis, knee     Patient Active Problem List   Diagnosis Date Noted  . Degenerative arthritis of right knee 06/04/2016  . Degenerative arthritis of left knee 04/10/2016  . Insomnia 03/20/2016  . Chest pain at rest   . Right knee pain 08/30/2015  . Symptomatic varicose veins of both lower extremities 07/25/2015  . Routine general medical examination at a health care facility 08/13/2014  . 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 Adjustment disorder with mixed anxiety and depressed mood 05/19/2013  . Paresthesia of lower extremity 01/18/2012  . 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 Hyperlipidemia 05/24/2009  . Hearing loss 04/25/2008  . CLOSTRIDIUM DIFFICILE COLITIS, HX OF 10/23/2007    Past Surgical History:  Procedure Laterality Date  . BUNIONECTOMY     right  . COCHLEAR IMPLANT  July '13   right ear. Dr. Idelle Crouch  . ROTATOR CUFF REPAIR Left 2012  . ROTATOR CUFF REPAIR Right 2007 & 2009  . TONSILLECTOMY    . VAGINAL  HYSTERECTOMY      OB History    No data available       Home Medications    Prior to Admission medications   Medication Sig Start Date End Date Taking? Authorizing Provider  acetaminophen (TYLENOL) 500 MG tablet Take 500 mg by mouth every 6 (six) hours as needed for mild pain.   Yes Historical Provider, MD  aspirin 81 MG tablet Take 81 mg by mouth daily.   Yes Historical Provider, MD  bifidobacterium infantis (ALIGN) capsule Take 1 capsule by mouth daily.   Yes Historical Provider, MD  diclofenac sodium (VOLTAREN) 1 % GEL Apply 2 g topically 4 (four) times daily. To affected joint. Patient taking differently: Apply 2 g topically daily as needed (pain). To affected joint. 05/16/16  Yes Lyndal Pulley, DO  diphenhydramine-acetaminophen (TYLENOL PM) 25-500 MG TABS tablet Take 1 tablet by mouth at bedtime as needed (sleep).   Yes Historical Provider, MD  nitroGLYCERIN (NITROSTAT) 0.4 MG SL tablet Place 1 tablet (0.4 mg total) under the tongue every 5 (five) minutes as needed for chest pain. 03/20/16  Yes Thurnell Lose, MD  ondansetron (ZOFRAN) 4 MG tablet Take 1 tablet (4 mg total) by mouth every 6 (six) hours as needed for nausea or vomiting. 07/22/16   Julianne Rice, MD  pantoprazole (PROTONIX) 20 MG tablet Take 1 tablet (20 mg total) by mouth daily. 07/22/16   Julianne Rice, MD  traMADol (ULTRAM) 50 MG tablet Take 1 tablet (50 mg total) by mouth every 6 (six) hours as needed. 07/22/16   Julianne Rice, MD    Family History Family History  Problem Relation Age of Onset  . Ovarian cancer Mother   . Transient ischemic attack Mother   . Breast cancer Maternal Aunt     aunts  . Stroke Sister   . Anxiety disorder Daughter     Social History Social History  Substance Use Topics  . Smoking status: Former Research scientist (life sciences)  . Smokeless tobacco: Never Used  . Alcohol use Yes     Comment: 1 glass of wine a day     Allergies   Review of patient's allergies indicates no known  allergies.   Review of Systems Review of Systems  Constitutional: Negative for chills and fever.  Respiratory: Positive for cough and shortness of breath. Negative for chest tightness.   Cardiovascular: Positive for chest pain. Negative for palpitations and leg swelling.  Gastrointestinal: Positive for abdominal pain, nausea and vomiting. Negative for abdominal distention, constipation and diarrhea.  Genitourinary: Negative for dysuria, flank pain and frequency.  Musculoskeletal: Negative for back pain, myalgias, neck pain and neck stiffness.  Skin: Negative for rash and wound.  Neurological: Negative for dizziness, weakness, light-headedness, numbness and headaches.  All other systems reviewed and are negative.    Physical Exam Updated Vital Signs BP (!) 103/54   Pulse 87   Temp 97.6 F (36.4 C) (Oral)   Resp 16   Ht 5\' 3"  (1.6 m)   Wt 150 lb (68 kg)   SpO2 99%   BMI 26.57 kg/m   Physical Exam  Constitutional: She is oriented to person, place, and time. She appears well-developed and well-nourished.  Anxious.  HENT:  Head: Normocephalic and atraumatic.  Mouth/Throat: Oropharynx is clear and moist.  Eyes: EOM are normal. Pupils are equal, round, and reactive to light.  Neck: Normal range of motion. Neck supple.  Cardiovascular: Normal rate and regular rhythm.   Pulmonary/Chest: Effort normal. She has rales.  Patient has bilateral basilar crackles.   Abdominal: Soft. Bowel sounds are normal. There is tenderness (epigastric tenderness to palpation. Patient also has some right anterior chest wall tenderness as well as right upper quadrant tenderness.). There is no rebound and no guarding.  Musculoskeletal: Normal range of motion. She exhibits no edema or tenderness.  No CVA tenderness bilaterally. No lower extremity swelling, asymmetry or tenderness. 2+ distal pulses in all extremities.  Neurological: She is alert and oriented to person, place, and time.  Moves all  extremities without deficit. Sensation is fully intact.  Skin: Skin is warm and dry. Capillary refill takes less than 2 seconds. No rash noted. No erythema.  Psychiatric: She has a normal mood and affect. Her behavior is normal.  Nursing note and vitals reviewed.    ED Treatments / Results  Labs (all labs ordered are listed, but only abnormal results are displayed) Labs Reviewed  BASIC METABOLIC PANEL - Abnormal; Notable for the following:       Result Value   Glucose, Bld 128 (*)    All other components within normal limits  CBC - Abnormal; Notable for the following:    WBC 15.0 (*)    Hemoglobin 15.5 (*)    All other components within normal limits  HEPATIC FUNCTION PANEL  LIPASE, BLOOD  Randolm Idol, ED  Randolm Idol, ED    EKG  EKG Interpretation  Date/Time:  Tuesday July 22 2016  07:32:58 EDT Ventricular Rate:  59 PR Interval:    QRS Duration: 89 QT Interval:  469 QTC Calculation: 465 R Axis:   19 Text Interpretation:  Sinus rhythm Low voltage, precordial leads Abnormal R-wave progression, early transition Confirmed by Lita Mains  MD, Darianne Muralles ([id number]) on 07/22/2016 7:35:41 AM       Radiology Dg Chest 2 View  Result Date: 07/22/2016 CLINICAL DATA:  Mid chest pain radiating to right chest since last night. EXAM: CHEST  2 VIEW COMPARISON:  03/20/2016 FINDINGS: Low lung volumes. Heart and mediastinal contours are within normal limits. No focal opacities or effusions. No acute bony abnormality. IMPRESSION: Low volumes.  No active cardiopulmonary disease. Electronically Signed   By: Rolm Baptise M.D.   On: 07/22/2016 08:35   Ct Abdomen Pelvis W Contrast  Result Date: 07/22/2016 CLINICAL DATA:  Upper abdominal pain with nausea and vomiting EXAM: CT ABDOMEN AND PELVIS WITH CONTRAST TECHNIQUE: Multidetector CT imaging of the abdomen and pelvis was performed using the standard protocol following bolus administration of intravenous contrast. CONTRAST:  158mL  ISOVUE-300 IOPAMIDOL (ISOVUE-300) INJECTION 61% COMPARISON:  Ultrasound right upper quadrant July 22, 2016 FINDINGS: Lower chest: There is mild bibasilar lung atelectasis. No lung base edema or consolidation appreciable. Hepatobiliary: There is a degree of hepatic steatosis. No focal liver lesions are evident. Gallbladder is mildly distended without appreciable wall thickening. There is no appreciable biliary duct dilatation. Pancreas: There is no pancreatic mass or inflammatory focus. Spleen: No splenic lesions are evident. There are small splenules anterior to the spleen. Adrenals/Urinary Tract: Adrenals appear unremarkable bilaterally. There is a cyst arising from the upper pole of the left kidney measuring 9.5 x 9.5 x 7.9 cm. No other renal masses are evident. There is no hydronephrosis on either side. There is no renal or ureteral calculus on either side. Increased attenuation within a nondilated proximal left ureter on the nephrographic phase of the study likely represents contrast in the proximal ureter preferentially as opposed to a calculus. There is no obstruction in this area. Urinary bladder is midline with wall thickness within normal limits. Stomach/Bowel: There are multiple sigmoid diverticula without diverticulitis. A lesser degree of diverticular noted in the descending colon, again without evidence of diverticulitis. There is no bowel wall or mesenteric thickening. No bowel obstruction. No free air or portal venous air. Vascular/Lymphatic: There is atherosclerotic calcification in the aorta and common iliac arteries. There is no abdominal aortic aneurysm. The major mesenteric vessels appear patent. There is a circumaortic left renal vein, an anatomic variant. There is no adenopathy appreciable in the abdomen or pelvis. Reproductive: Uterus is absent. There is no pelvic mass or pelvic fluid collection. Other: Appendix appears normal. No abscess or ascites is evident in the abdomen or pelvis.  Musculoskeletal: There is lumbar dextroscoliosis. There is degenerative change in the lumbar spine. There are no blastic or lytic bone lesions. There is no intramuscular or abdominal wall lesion. IMPRESSION: Descending and sigmoid colon diverticulosis without frank diverticulitis. No bowel wall thickening or bowel obstruction. No abscess.  Appendix appears normal. Gallbladder somewhat distended, but there is no evidence of gallbladder wall thickening. There is a degree of hepatic steatosis. No focal liver lesions are evident. There is aortoiliac atherosclerosis. There is a dominant cyst arising from the left kidney measuring 9.5 x 9.5 x 7.9 cm. No renal or ureteral calculus is evident. Increased attenuation in a nondilated proximal left ureter likely represents mild asymmetric contrast opacification. Electronically Signed   By: Lowella Grip III  M.D.   On: 07/22/2016 10:39   US Abdomen Limited  Result Date: 07/22/2016 CLINICAL DATA:  Severe right upper quadrant pain since 6 a.m. EXAM: US ABDOMEN LIMITED - RIGHT UPPER QUADRANT COMPARISON:  Abdominal ultrasound dated May 17, 2015 FINDINGS: Gallbladder: No gallstones or wall thickening visualized. No sonographic Murphy sign noted by sonographer. Common bile duct: Diameter: 4.4 mm Liver: The hepatic echotexture is mildly increased diffusely. There is no focal mass or ductal dilation. IMPRESSION: No gallstones or sonographic evidence of acute cholecystitis. If there are clinical concerns of chronic gallbladder dysfunction, a nuclear medicine hepatobiliary scan with gallbladder ejection fraction determination may be useful. Probable fatty infiltrative change of the liver. Electronically Signed   By: Suhailah Kwan  [place] M.D.   On: 07/22/2016 08:56    Procedures Procedures (including critical care time)  Medications Ordered in ED Medications  famotidine (PEPCID) IVPB 20 mg premix (20 mg Intravenous New Bag/Given 07/22/16 1346)  morphine 2 MG/ML injection 2 mg  (2 mg Intravenous Given 07/22/16 0906)  ondansetron (ZOFRAN) injection 4 mg (4 mg Intravenous Given 07/22/16 0906)  sodium chloride 0.9 % bolus 500 mL (0 mLs Intravenous Stopped 07/22/16 1056)  iopamidol (ISOVUE-300) 61 % injection 100 mL (100 mLs Intravenous Contrast Given 07/22/16 1006)  gi cocktail (Maalox,Lidocaine,Donnatal) (30 mLs Oral Given 07/22/16 1346)  ondansetron (ZOFRAN) injection 4 mg (4 mg Intravenous Given 07/22/16 1346)     Initial Impression / Assessment and Plan / ED Course  I have reviewed the triage vital signs and the nursing notes.  Pertinent labs & imaging results that were available during my care of the patient were reviewed by me and considered in my medical decision making (see chart for details).  Clinical Course   Patient continues to have pain despite medication. Reproduced over the epigastrium with palpation. Troponin 2 is normal. EKG is unchanged from previous. Low suspicion for coronary artery disease.  GI cocktail given with significant improvement in her epigastric pain. Symptoms consistent with gastritis/GERD. She is tolerating PO intake. Will start on PPI and Zofran. Will give GI f/u and return precautions. Pt and husband agree with plan.  Final Clinical Impressions(s) / ED Diagnoses   Final diagnoses:  Acute epigastric pain    New Prescriptions New Prescriptions   ONDANSETRON (ZOFRAN) 4 MG TABLET    Take 1 tablet (4 mg total) by mouth every 6 (six) hours as needed for nausea or vomiting.   PANTOPRAZOLE (PROTONIX) 20 MG TABLET    Take 1 tablet (20 mg total) by mouth daily.   TRAMADOL (ULTRAM) 50 MG TABLET    Take 1 tablet (50 mg total) by mouth every 6 (six) hours as needed.     Julianne Rice, MD 07/22/16 1504

## 2016-07-22 NOTE — ED Notes (Signed)
Patient is resting.  Husband is at bedside.  NAD noted.

## 2016-07-22 NOTE — ED Notes (Signed)
Pt walked with husband to bathroom to void prior to pain meds/IV as requested.

## 2016-07-22 NOTE — ED Notes (Signed)
Pt reports taking NTG SL x 4 total around 0230 but states it didn't relieve her pain nor did it give her a headache.

## 2016-07-22 NOTE — ED Notes (Signed)
Nurse started IV and collected blood

## 2016-07-22 NOTE — ED Triage Notes (Signed)
Pt presents with c/o central chest pain that started around 1 am. Pt reports that the pain is now radiating to her right rib cage area. Pt reports that she has also been vomiting for the past hour. Pt reports that when she arrived, she also began to feel short of breath. Pt is alert and oriented, NAD.

## 2016-07-24 ENCOUNTER — Encounter (HOSPITAL_COMMUNITY): Payer: Self-pay | Admitting: Nurse Practitioner

## 2016-07-24 ENCOUNTER — Ambulatory Visit (INDEPENDENT_AMBULATORY_CARE_PROVIDER_SITE_OTHER): Payer: Medicare Other | Admitting: Gastroenterology

## 2016-07-24 ENCOUNTER — Ambulatory Visit (HOSPITAL_COMMUNITY)
Admission: RE | Admit: 2016-07-24 | Discharge: 2016-07-24 | Disposition: A | Discharge status: Home / Self Care | Payer: Medicare Other | Source: Ambulatory Visit | Attending: Gastroenterology | Admitting: Gastroenterology

## 2016-07-24 ENCOUNTER — Encounter: Payer: Self-pay | Admitting: Gastroenterology

## 2016-07-24 ENCOUNTER — Telehealth: Payer: Self-pay

## 2016-07-24 ENCOUNTER — Inpatient Hospital Stay (HOSPITAL_COMMUNITY)
Admission: EM | Admit: 2016-07-24 | Discharge: 2016-07-31 | DRG: 417 | Disposition: A | Discharge status: Other Acute Inpatient Hospital | Payer: Medicare Other | Attending: Gastroenterology | Admitting: Gastroenterology

## 2016-07-24 ENCOUNTER — Telehealth: Payer: Self-pay | Admitting: Gastroenterology

## 2016-07-24 ENCOUNTER — Telehealth: Payer: Self-pay | Admitting: Physician Assistant

## 2016-07-24 ENCOUNTER — Other Ambulatory Visit (INDEPENDENT_AMBULATORY_CARE_PROVIDER_SITE_OTHER): Payer: Medicare Other

## 2016-07-24 VITALS — BP 110/70 | HR 96 | Ht 63.0 in | Wt 155.0 lb

## 2016-07-24 DIAGNOSIS — Y92234 Operating room of hospital as the place of occurrence of the external cause: Secondary | ICD-10-CM | POA: Diagnosis not present

## 2016-07-24 DIAGNOSIS — R630 Anorexia: Secondary | ICD-10-CM

## 2016-07-24 DIAGNOSIS — K810 Acute cholecystitis: Principal | ICD-10-CM | POA: Diagnosis present

## 2016-07-24 DIAGNOSIS — Z7982 Long term (current) use of aspirin: Secondary | ICD-10-CM

## 2016-07-24 DIAGNOSIS — Z9621 Cochlear implant status: Secondary | ICD-10-CM | POA: Diagnosis present

## 2016-07-24 DIAGNOSIS — Z87891 Personal history of nicotine dependence: Secondary | ICD-10-CM

## 2016-07-24 DIAGNOSIS — K828 Other specified diseases of gallbladder: Secondary | ICD-10-CM | POA: Diagnosis present

## 2016-07-24 DIAGNOSIS — Z86010 Personal history of colonic polyps: Secondary | ICD-10-CM

## 2016-07-24 DIAGNOSIS — K819 Cholecystitis, unspecified: Secondary | ICD-10-CM

## 2016-07-24 DIAGNOSIS — R7989 Other specified abnormal findings of blood chemistry: Secondary | ICD-10-CM | POA: Diagnosis not present

## 2016-07-24 DIAGNOSIS — Z8719 Personal history of other diseases of the digestive system: Secondary | ICD-10-CM

## 2016-07-24 DIAGNOSIS — Z79891 Long term (current) use of opiate analgesic: Secondary | ICD-10-CM

## 2016-07-24 DIAGNOSIS — M1710 Unilateral primary osteoarthritis, unspecified knee: Secondary | ICD-10-CM | POA: Diagnosis present

## 2016-07-24 DIAGNOSIS — K831 Obstruction of bile duct: Secondary | ICD-10-CM | POA: Diagnosis present

## 2016-07-24 DIAGNOSIS — R1013 Epigastric pain: Secondary | ICD-10-CM | POA: Diagnosis not present

## 2016-07-24 DIAGNOSIS — K9189 Other postprocedural complications and disorders of digestive system: Secondary | ICD-10-CM | POA: Diagnosis not present

## 2016-07-24 DIAGNOSIS — Z538 Procedure and treatment not carried out for other reasons: Secondary | ICD-10-CM | POA: Diagnosis not present

## 2016-07-24 DIAGNOSIS — Y832 Surgical operation with anastomosis, bypass or graft as the cause of abnormal reaction of the patient, or of later complication, without mention of misadventure at the time of the procedure: Secondary | ICD-10-CM | POA: Diagnosis not present

## 2016-07-24 DIAGNOSIS — K839 Disease of biliary tract, unspecified: Secondary | ICD-10-CM

## 2016-07-24 DIAGNOSIS — H9193 Unspecified hearing loss, bilateral: Secondary | ICD-10-CM | POA: Diagnosis present

## 2016-07-24 DIAGNOSIS — Z79899 Other long term (current) drug therapy: Secondary | ICD-10-CM

## 2016-07-24 DIAGNOSIS — Z791 Long term (current) use of non-steroidal anti-inflammatories (NSAID): Secondary | ICD-10-CM

## 2016-07-24 DIAGNOSIS — E871 Hypo-osmolality and hyponatremia: Secondary | ICD-10-CM | POA: Diagnosis present

## 2016-07-24 LAB — COMPREHENSIVE METABOLIC PANEL
ALT: 12 U/L (ref 0–35)
AST: 18 U/L (ref 0–37)
Albumin: 3.9 g/dL (ref 3.5–5.2)
Alkaline Phosphatase: 64 U/L (ref 39–117)
BUN: 19 mg/dL (ref 6–23)
CO2: 30 meq/L (ref 19–32)
Calcium: 9.6 mg/dL (ref 8.4–10.5)
Chloride: 96 mEq/L (ref 96–112)
Creatinine, Ser: 0.83 mg/dL (ref 0.40–1.20)
GFR: 70.56 mL/min (ref >60.00)
GLUCOSE: 111 mg/dL — AB (ref 70–99)
POTASSIUM: 4.2 meq/L (ref 3.5–5.1)
SODIUM: 134 meq/L — AB (ref 135–145)
Total Bilirubin: 1.7 mg/dL — ABNORMAL HIGH (ref 0.2–1.2)
Total Protein: 6.8 g/dL (ref 6.0–8.3)

## 2016-07-24 LAB — CBC WITH DIFFERENTIAL/PLATELET
BASOS ABS: 0 10*3/uL (ref 0.0–0.1)
Basophils Relative: 0 % (ref 0.0–3.0)
Eosinophils Absolute: 0 10*3/uL (ref 0.0–0.7)
Eosinophils Relative: 0.2 % (ref 0.0–5.0)
HCT: 43 % (ref 36.0–46.0)
Hemoglobin: 14.8 g/dL (ref 12.0–15.0)
LYMPHS ABS: 1.4 10*3/uL (ref 0.7–4.0)
Lymphocytes Relative: 7.3 % — ABNORMAL LOW (ref 12.0–46.0)
MCHC: 34.3 g/dL (ref 30.0–36.0)
MCV: 93.4 fl (ref 78.0–100.0)
MONO ABS: 1.8 10*3/uL — AB (ref 0.1–1.0)
MONOS PCT: 9.6 % (ref 3.0–12.0)
NEUTROS ABS: 15.9 10*3/uL — AB (ref 1.4–7.7)
NEUTROS PCT: 82.9 % — AB (ref 43.0–77.0)
PLATELETS: 243 10*3/uL (ref 150.0–400.0)
RBC: 4.61 Mil/uL (ref 3.87–5.11)
RDW: 13.7 % (ref 11.5–15.5)
WBC: 19.1 10*3/uL (ref 4.0–10.5)

## 2016-07-24 LAB — I-STAT CG4 LACTIC ACID, ED: Lactic Acid, Venous: 0.74 mmol/L (ref 0.5–1.9)

## 2016-07-24 LAB — LIPASE: Lipase: 5 U/L — ABNORMAL LOW (ref 11.0–59.0)

## 2016-07-24 MED ORDER — PIPERACILLIN-TAZOBACTAM 3.375 G IVPB 30 MIN
3.3750 g | Freq: Once | INTRAVENOUS | Status: AC
  Administered 2016-07-24: 3.375 g via INTRAVENOUS
  Filled 2016-07-24: qty 50

## 2016-07-24 MED ORDER — HYDROMORPHONE HCL 1 MG/ML IJ SOLN
1.0000 mg | INTRAMUSCULAR | Status: DC | PRN
  Administered 2016-07-25: 1 mg via INTRAVENOUS
  Filled 2016-07-24: qty 1

## 2016-07-24 MED ORDER — HYDROCODONE-ACETAMINOPHEN 5-325 MG PO TABS
1.0000 | ORAL_TABLET | ORAL | Status: DC | PRN
  Administered 2016-07-27 – 2016-07-28 (×4): 1 via ORAL
  Administered 2016-07-28: 2 via ORAL
  Administered 2016-07-28 – 2016-07-29 (×2): 1 via ORAL
  Administered 2016-07-29 – 2016-07-30 (×2): 2 via ORAL
  Filled 2016-07-24 (×3): qty 1
  Filled 2016-07-24: qty 2
  Filled 2016-07-24: qty 1
  Filled 2016-07-24: qty 2
  Filled 2016-07-24: qty 1
  Filled 2016-07-24: qty 2
  Filled 2016-07-24: qty 1

## 2016-07-24 MED ORDER — ONDANSETRON HCL 4 MG/2ML IJ SOLN
4.0000 mg | Freq: Four times a day (QID) | INTRAMUSCULAR | Status: DC | PRN
  Administered 2016-07-26: 4 mg via INTRAVENOUS
  Filled 2016-07-24 (×2): qty 2

## 2016-07-24 MED ORDER — MORPHINE SULFATE (PF) 2 MG/ML IV SOLN
4.0000 mg | Freq: Once | INTRAVENOUS | Status: AC
  Administered 2016-07-24: 4 mg via INTRAVENOUS
  Filled 2016-07-24: qty 2

## 2016-07-24 MED ORDER — KCL IN DEXTROSE-NACL 20-5-0.45 MEQ/L-%-% IV SOLN
INTRAVENOUS | Status: DC
  Administered 2016-07-25 – 2016-07-31 (×11): via INTRAVENOUS
  Filled 2016-07-24 (×14): qty 1000

## 2016-07-24 MED ORDER — IOPAMIDOL (ISOVUE-300) INJECTION 61%
100.0000 mL | Freq: Once | INTRAVENOUS | Status: AC | PRN
  Administered 2016-07-24: 100 mL via INTRAVENOUS

## 2016-07-24 MED ORDER — PIPERACILLIN-TAZOBACTAM 3.375 G IVPB
3.3750 g | Freq: Three times a day (TID) | INTRAVENOUS | Status: DC
  Administered 2016-07-25 – 2016-07-31 (×20): 3.375 g via INTRAVENOUS
  Filled 2016-07-24 (×21): qty 50

## 2016-07-24 MED ORDER — ACETAMINOPHEN 325 MG PO TABS
650.0000 mg | ORAL_TABLET | Freq: Four times a day (QID) | ORAL | Status: DC | PRN
Start: 2016-07-24 — End: 2016-07-31
  Administered 2016-07-30: 650 mg via ORAL
  Filled 2016-07-24: qty 2

## 2016-07-24 MED ORDER — ACETAMINOPHEN 650 MG RE SUPP
650.0000 mg | Freq: Four times a day (QID) | RECTAL | Status: DC | PRN
Start: 2016-07-24 — End: 2016-07-31

## 2016-07-24 MED ORDER — ONDANSETRON 4 MG PO TBDP: 4.0000 mg | ORAL_TABLET | Freq: Four times a day (QID) | ORAL | Status: DC | PRN

## 2016-07-24 MED ORDER — IOPAMIDOL (ISOVUE-300) INJECTION 61%
30.0000 mL | Freq: Once | INTRAVENOUS | Status: AC | PRN
  Administered 2016-07-24: 30 mL via ORAL

## 2016-07-24 NOTE — ED Notes (Signed)
I stat lactic obtained and in mini-lab.

## 2016-07-24 NOTE — ED Provider Notes (Signed)
[facility] DEPT Provider Note   CSN: [id number] Arrival date & time: 07/24/16  1717     History   Chief Complaint Chief Complaint  Patient presents with  . Cholecystectomy    HPI Angela Barber is a 80 y.o. female presents with cholecystitis. PMH significant for anxiety, C. Diff colitis, hearing loss, diverticulosis, hx of hyperplastic colon polyps, osteoarthritis. She was seen here on 10/31 for "chest pain" which has started at ~12AM. She had associated N/V and RUQ tenderness. A RUQ [place] was done which showed did not show gallstones or sonographic evidence of acute cholecystitis. CT of A&P was done which showed that the gallbladder was somewhat distended, but no evidence of gallbladder wall thickening. She had leukocytosis of 15.0 at that time. She had a GI cocktail and felt better so was discharged with GI follow up. Since then she has persistent pain without vomiting. She states the pain is constant and severe if she moves but if she stays still it's okay. Denies nausea or vomiting presently but has had decreased PO intake. She followed up with GI today. They repeated labs which showed worsening leukocytosis of 19.1. CMP remarkable for mild hyponatremia and elevated bilirubin (1.7 today up from 0.8 two days ago). Due to worsening symptoms they sent her for a repeat scan which showed acalculous cholecystitis. Denies fevers. Past surgical hx significant for abdominal hysterectomy. Last PO intake was 3PM.  HPI  Past Medical History:  Diagnosis Date  . Anxiety   . C. difficile diarrhea 9/06  . Cochlear implant status   . Depression   . Diverticulosis   . Hearing loss of both ears   . HLD (hyperlipidemia)   . Hyperplastic colon polyp   . Insomnia   . Internal hemorrhoid   . Osteoarthritis, knee     Patient Active Problem List   Diagnosis Date Noted  . Degenerative arthritis of right knee 06/04/2016  . Degenerative arthritis of left knee 04/10/2016  . Insomnia 03/20/2016  .  Chest pain at rest   . Right knee pain 08/30/2015  . Symptomatic varicose veins of both lower extremities 07/25/2015  . Routine general medical examination at a health care facility 08/13/2014  . 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 Adjustment disorder with mixed anxiety and depressed mood 05/19/2013  . Paresthesia of lower extremity 01/18/2012  . 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 Hyperlipidemia 05/24/2009  . Hearing loss 04/25/2008  . CLOSTRIDIUM DIFFICILE COLITIS, HX OF 10/23/2007    Past Surgical History:  Procedure Laterality Date  . BUNIONECTOMY     right  . COCHLEAR IMPLANT  July '13   right ear. Dr. Idelle Crouch  . ROTATOR CUFF REPAIR Left 2012  . ROTATOR CUFF REPAIR Right 2007 & 2009  . TONSILLECTOMY    . VAGINAL HYSTERECTOMY      OB History    No data available       Home Medications    Prior to Admission medications   Medication Sig Start Date End Date Taking? Authorizing Provider  acetaminophen (TYLENOL) 500 MG tablet Take 500 mg by mouth every 6 (six) hours as needed for mild pain.    Historical Provider, MD  aspirin 81 MG tablet Take 81 mg by mouth daily.    Historical Provider, MD  bifidobacterium infantis (ALIGN) capsule Take 1 capsule by mouth daily.    Historical Provider, MD  diclofenac sodium (VOLTAREN) 1 % GEL Apply 2 g topically 4 (four) times daily.  To affected joint. Patient taking differently: Apply 2 g topically daily as needed (pain). To affected joint. 05/16/16   Lyndal Pulley, DO  diphenhydramine-acetaminophen (TYLENOL PM) 25-500 MG TABS tablet Take 1 tablet by mouth at bedtime as needed (sleep).    Historical Provider, MD  nitroGLYCERIN (NITROSTAT) 0.4 MG SL tablet Place 1 tablet (0.4 mg total) under the tongue every 5 (five) minutes as needed for chest pain. 03/20/16   Thurnell Lose, MD  ondansetron (ZOFRAN) 4 MG tablet Take 1 tablet (4 mg total) by mouth every 6 (six) hours as needed for nausea or  vomiting. Patient not taking: Reported on 07/24/2016 07/22/16   Julianne Rice, MD  pantoprazole (PROTONIX) 20 MG tablet Take 1 tablet (20 mg total) by mouth daily. 07/22/16   Julianne Rice, MD  traMADol (ULTRAM) 50 MG tablet Take 1 tablet (50 mg total) by mouth every 6 (six) hours as needed. 07/22/16   Julianne Rice, MD    Family History Family History  Problem Relation Age of Onset  . Ovarian cancer Mother   . Transient ischemic attack Mother   . Breast cancer Maternal Aunt     aunts  . Stroke Sister   . Anxiety disorder Daughter     Social History Social History  Substance Use Topics  . Smoking status: Former Research scientist (life sciences)  . Smokeless tobacco: Never Used  . Alcohol use Yes     Comment: 1 glass of wine a day     Allergies   Review of patient's allergies indicates no known allergies.   Review of Systems Review of Systems  Constitutional: Positive for appetite change. Negative for chills and fever.  Gastrointestinal: Positive for abdominal pain. Negative for nausea and vomiting.  All other systems reviewed and are negative.    Physical Exam Updated Vital Signs BP 138/82   Pulse 96   Temp 98 F (36.7 C) (Oral)   Resp 17   Ht 5\' 3"  (1.6 m)   Wt 70.3 kg   SpO2 92%   BMI 27.46 kg/m   Physical Exam  Constitutional: She is oriented to person, place, and time. She appears well-developed and well-nourished. No distress.  HENT:  Head: Normocephalic and atraumatic.  Eyes: Conjunctivae are normal. Pupils are equal, round, and reactive to light. Right eye exhibits no discharge. Left eye exhibits no discharge. No scleral icterus.  Neck: Normal range of motion. Neck supple.  Cardiovascular: Normal rate and regular rhythm.  Exam reveals no gallop and no friction rub.   No murmur heard. Pulmonary/Chest: Effort normal and breath sounds normal. No respiratory distress. She has no wheezes. She has no rales. She exhibits no tenderness.  Abdominal: Soft. Bowel sounds are normal.  She exhibits no distension and no mass. There is tenderness. There is no rebound and no guarding. No hernia.  Epigastric and RUQ tenderness  Musculoskeletal: She exhibits no edema.  Neurological: She is alert and oriented to person, place, and time.  Skin: Skin is warm and dry.  Psychiatric: She has a normal mood and affect. Her behavior is normal.  Nursing note and vitals reviewed.    ED Treatments / Results  Labs (all labs ordered are listed, but only abnormal results are displayed) Labs Reviewed  COMPREHENSIVE METABOLIC PANEL  I-STAT CG4 LACTIC ACID, ED    EKG  EKG Interpretation None       Radiology Ct Abdomen Pelvis W Contrast  Result Date: 07/24/2016 CLINICAL DATA:  Upper abdominal pain EXAM: CT ABDOMEN AND PELVIS WITH  CONTRAST TECHNIQUE: Multidetector CT imaging of the abdomen and pelvis was performed using the standard protocol following bolus administration of intravenous contrast. CONTRAST:  12mL ISOVUE-300 IOPAMIDOL (ISOVUE-300) INJECTION 61% COMPARISON:  None. FINDINGS: Lower chest: Small right pleural effusion identified. Subsegmental atelectasis versus scar noted in the right middle lobe and right lower lobe. Hepatobiliary: No focal liver abnormality. No intrahepatic bile duct dilatation. The gallbladder wall appears diffusely edematous and is enhancing. Pericholecystic fat stranding and free fluid is identified. The common bile duct is mildly increased in caliber measuring up to 9 mm. Pancreas: Unremarkable. No pancreatic ductal dilatation or surrounding inflammatory changes. Spleen: Normal in size without focal abnormality. Adrenals/Urinary Tract: Normal adrenal glands. Large simple appearing left kidney cyst measures 10 cm, image 28 of series 2. No hydronephrosis. The urinary bladder appears normal. Stomach/Bowel: The stomach is normal. The small bowel loops have a normal course and caliber without evidence for obstruction. The appendix is visualized and appears normal.  Distal colonic diverticula identified without acute inflammation. Vascular/Lymphatic: Aortic atherosclerosis. No aneurysm. Porta hepatis node is enhancing measuring 8 mm. Enhancing portacaval lymph node Measures 1.4 cm, image number 29 of series 2. No pelvic or inguinal adenopathy. Reproductive: Status post hysterectomy. No adnexal masses. Other: No abdominal wall hernia or abnormality. Ascites is identified within the right upper quadrant of the abdomen Musculoskeletal: No aggressive lytic or sclerotic bone lesions. Degenerative disc disease noted within the lower lumbar spine. IMPRESSION: 1. Imaging findings are concerning for acute acalculous cholecystitis. There is gallbladder wall edema and enhancement with surrounding inflammatory changes including fat stranding and free fluid. 2. Enlarged upper abdominal lymph nodes are favored to be reactive. 3. Aortic atherosclerosis Electronically Signed   By: Kerby Moors M.D.   On: 07/24/2016 16:18    Procedures Procedures (including critical care time)  Medications Ordered in ED Medications  dextrose 5 % and 0.45 % NaCl with KCl 20 mEq/L infusion (not administered)  piperacillin-tazobactam (ZOSYN) IVPB 3.375 g (not administered)  acetaminophen (TYLENOL) tablet 650 mg (not administered)    Or  acetaminophen (TYLENOL) suppository 650 mg (not administered)  HYDROcodone-acetaminophen (NORCO/VICODIN) 5-325 MG per tablet 1-2 tablet (not administered)  HYDROmorphone (DILAUDID) injection 1 mg (not administered)  ondansetron (ZOFRAN-ODT) disintegrating tablet 4 mg (not administered)    Or  ondansetron (ZOFRAN) injection 4 mg (not administered)  piperacillin-tazobactam (ZOSYN) IVPB 3.375 g (0 g Intravenous Stopped 07/24/16 2222)  morphine 2 MG/ML injection 4 mg (4 mg Intravenous Given 07/24/16 2050)     Initial Impression / Assessment and Plan / ED Course  I have reviewed the triage vital signs and the nursing notes.  Pertinent labs & imaging results  that were available during my care of the patient were reviewed by me and considered in my medical decision making (see chart for details).  Clinical Course   80 year old female with cholecystitis. Patient is afebrile, not tachycardic or tachypneic, normotensive, and not hypoxic. Lactate is normal. Zosyn started. Pain meds given.   Spoke with Dr. Harlow Asa who will consult on patient and asked me to consult GI since patient may need HIDA scan and since they originally ordered test.   Spoke with Dr. Loletha Carrow with [facility] who will come to see patient. Please see consult note for separate evaluation.   Surgery to admit  Final Clinical Impressions(s) / ED Diagnoses   Final diagnoses:  Cholecystitis    New Prescriptions New Prescriptions   No medications on file     Recardo Evangelist, PA-C  07/24/16 [place], MD 07/25/16 [phone]

## 2016-07-24 NOTE — Telephone Encounter (Addendum)
Left message for pt or husband to return call. Dr Havery Moros would like for pt to have a stat CT scan. There is availability at 4 today.  Unable to speak with husband. Spoke with pt and she knows not to eat anything but was unable to hear instructions about CT scan and husband is not available.  Spoke with pts daughter. She will inform her father of CT scan instructions. Pt needs to [facility] at [facility] at 2:00pm today for a 4:00pm scan. She will drink contrast at 2 and at 3.

## 2016-07-24 NOTE — Progress Notes (Signed)
HPI :  80 y/o female here with multiple medical problems as outlined below, for evaluation after recent ER visit for epigastric to RUQ pain.   Patient endorses upper abdominal pain since onset on Monday night. Symptoms started with mild pain in the epigastric area / vomiting, which then moved to the RUQ, severely painful. She was seen in the ER for further workup, had labs, CT scan, and [place] which did not reveal a clear etiology. Symptoms have persisted over this time but tolerance of pain has improved due to medication, she is taking ultram PRN. She did endorse some mild improvement with GI cocktail in the ER and was sent home.  Pain is in the RUQ to epigastric area at this time. Pain rated 3/10 in the office today when she is not moving, but with movement increases significantly. She reports the vomiting stopped but she has no appetite and hasn't eaten much of any solid food for a few days. She has not been eating too much lately . She is tolerating fluids, drinking mostly water, and urinating okay, she thinks she is maintaining hydration. She reports poor appetite however. She is not sure if eating makes the pain worse. No fevers. She denies symptoms like this previously. She has been given zofran for nausea which she has not taken, protonix 20mg  po q day, and ultram PRN. She denies any NSAIDs.. She takes tylenol PM PRN.   She was admitted to the hospital for chest pain in June which led to cardiology evaluation. She had a normal echocardiogram and stress test in June. She denies shortness of breath at present. She endorses chronic loose stools at her baseline. No blood in the stools. She denies dysuria or blood in the urine. She denies any trauma to the abdominal wall. WBC noted to be 15 in the ER on 10/31.   [place] 10/31 - no gallstones or abnormality of GB, fatty liver, normal CBD CT scan 10/31 - hepatic steatosis, GB distended without thickening, no bile duct dilation, 9cm renal cyst, diverticulosis,     Colonoscopy 12/17/2006 - hemorrhoids, diverticulosis  Past Medical History:  Diagnosis Date  . Anxiety   . C. difficile diarrhea 9/06  . Cochlear implant status   . Depression   . Diverticulosis   . Hearing loss of both ears   . HLD (hyperlipidemia)   . Hyperplastic colon polyp   . Insomnia   . Internal hemorrhoid   . Osteoarthritis, knee      Past Surgical History:  Procedure Laterality Date  . BUNIONECTOMY     right  . COCHLEAR IMPLANT  July '13   right ear. Dr. Idelle Crouch  . ROTATOR CUFF REPAIR Left 2012  . ROTATOR CUFF REPAIR Right 2007 & 2009  . TONSILLECTOMY    . VAGINAL HYSTERECTOMY     Family History  Problem Relation Age of Onset  . Ovarian cancer Mother   . Transient ischemic attack Mother   . Breast cancer Maternal Aunt     aunts  . Stroke Sister   . Anxiety disorder Daughter    Social History  Substance Use Topics  . Smoking status: Former Research scientist (life sciences)  . Smokeless tobacco: Never Used  . Alcohol use Yes     Comment: 1 glass of wine a day   Current Outpatient Prescriptions  Medication Sig Dispense Refill  . acetaminophen (TYLENOL) 500 MG tablet Take 500 mg by mouth every 6 (six) hours as needed for mild pain.    Marland Kitchen aspirin 81  MG tablet Take 81 mg by mouth daily.    . bifidobacterium infantis (ALIGN) capsule Take 1 capsule by mouth daily.    . diclofenac sodium (VOLTAREN) 1 % GEL Apply 2 g topically 4 (four) times daily. To affected joint. (Patient taking differently: Apply 2 g topically daily as needed (pain). To affected joint.) 300 g 3  . diphenhydramine-acetaminophen (TYLENOL PM) 25-500 MG TABS tablet Take 1 tablet by mouth at bedtime as needed (sleep).    . nitroGLYCERIN (NITROSTAT) 0.4 MG SL tablet Place 1 tablet (0.4 mg total) under the tongue every 5 (five) minutes as needed for chest pain. 20 tablet 12  . pantoprazole (PROTONIX) 20 MG tablet Take 1 tablet (20 mg total) by mouth daily. 30 tablet 0  . traMADol (ULTRAM) 50 MG tablet Take 1 tablet (50  mg total) by mouth every 6 (six) hours as needed. 15 tablet 0  . ondansetron (ZOFRAN) 4 MG tablet Take 1 tablet (4 mg total) by mouth every 6 (six) hours as needed for nausea or vomiting. (Patient not taking: Reported on 07/24/2016) 12 tablet 0   No current facility-administered medications for this visit.    No Known Allergies   Review of Systems: All systems reviewed and negative except where noted in HPI.    Dg Chest 2 View  Result Date: 07/22/2016 CLINICAL DATA:  Mid chest pain radiating to right chest since last night. EXAM: CHEST  2 VIEW COMPARISON:  03/20/2016 FINDINGS: Low lung volumes. Heart and mediastinal contours are within normal limits. No focal opacities or effusions. No acute bony abnormality. IMPRESSION: Low volumes.  No active cardiopulmonary disease. Electronically Signed   By: Rolm Baptise M.D.   On: 07/22/2016 08:35   Ct Abdomen Pelvis W Contrast  Result Date: 07/22/2016 CLINICAL DATA:  Upper abdominal pain with nausea and vomiting EXAM: CT ABDOMEN AND PELVIS WITH CONTRAST TECHNIQUE: Multidetector CT imaging of the abdomen and pelvis was performed using the standard protocol following bolus administration of intravenous contrast. CONTRAST:  133mL ISOVUE-300 IOPAMIDOL (ISOVUE-300) INJECTION 61% COMPARISON:  Ultrasound right upper quadrant July 22, 2016 FINDINGS: Lower chest: There is mild bibasilar lung atelectasis. No lung base edema or consolidation appreciable. Hepatobiliary: There is a degree of hepatic steatosis. No focal liver lesions are evident. Gallbladder is mildly distended without appreciable wall thickening. There is no appreciable biliary duct dilatation. Pancreas: There is no pancreatic mass or inflammatory focus. Spleen: No splenic lesions are evident. There are small splenules anterior to the spleen. Adrenals/Urinary Tract: Adrenals appear unremarkable bilaterally. There is a cyst arising from the upper pole of the left kidney measuring 9.5 x 9.5 x 7.9 cm.  No other renal masses are evident. There is no hydronephrosis on either side. There is no renal or ureteral calculus on either side. Increased attenuation within a nondilated proximal left ureter on the nephrographic phase of the study likely represents contrast in the proximal ureter preferentially as opposed to a calculus. There is no obstruction in this area. Urinary bladder is midline with wall thickness within normal limits. Stomach/Bowel: There are multiple sigmoid diverticula without diverticulitis. A lesser degree of diverticular noted in the descending colon, again without evidence of diverticulitis. There is no bowel wall or mesenteric thickening. No bowel obstruction. No free air or portal venous air. Vascular/Lymphatic: There is atherosclerotic calcification in the aorta and common iliac arteries. There is no abdominal aortic aneurysm. The major mesenteric vessels appear patent. There is a circumaortic left renal vein, an anatomic variant. There is no  adenopathy appreciable in the abdomen or pelvis. Reproductive: Uterus is absent. There is no pelvic mass or pelvic fluid collection. Other: Appendix appears normal. No abscess or ascites is evident in the abdomen or pelvis. Musculoskeletal: There is lumbar dextroscoliosis. There is degenerative change in the lumbar spine. There are no blastic or lytic bone lesions. There is no intramuscular or abdominal wall lesion. IMPRESSION: Descending and sigmoid colon diverticulosis without frank diverticulitis. No bowel wall thickening or bowel obstruction. No abscess.  Appendix appears normal. Gallbladder somewhat distended, but there is no evidence of gallbladder wall thickening. There is a degree of hepatic steatosis. No focal liver lesions are evident. There is aortoiliac atherosclerosis. There is a dominant cyst arising from the left kidney measuring 9.5 x 9.5 x 7.9 cm. No renal or ureteral calculus is evident. Increased attenuation in a nondilated proximal left  ureter likely represents mild asymmetric contrast opacification. Electronically Signed   By: Lowella Grip III M.D.   On: 07/22/2016 10:39   US Abdomen Limited  Result Date: 07/22/2016 CLINICAL DATA:  Severe right upper quadrant pain since 6 a.m. EXAM: US ABDOMEN LIMITED - RIGHT UPPER QUADRANT COMPARISON:  Abdominal ultrasound dated May 17, 2015 FINDINGS: Gallbladder: No gallstones or wall thickening visualized. No sonographic Murphy sign noted by sonographer. Common bile duct: Diameter: 4.4 mm Liver: The hepatic echotexture is mildly increased diffusely. There is no focal mass or ductal dilation. IMPRESSION: No gallstones or sonographic evidence of acute cholecystitis. If there are clinical concerns of chronic gallbladder dysfunction, a nuclear medicine hepatobiliary scan with gallbladder ejection fraction determination may be useful. Probable fatty infiltrative change of the liver. Electronically Signed   By: David  Martinique M.D.   On: 07/22/2016 08:56   CBC Latest Ref Rng & Units 07/24/2016 07/22/2016 03/20/2016  WBC 4.0 - 10.5 K/uL 19.1 Repeated and verified X2.(Theodore) 15.0(H) 12.3(H)  Hemoglobin 12.0 - 15.0 g/dL 14.8 15.5(H) 16.6(H)  Hematocrit 36.0 - 46.0 % 43.0 43.9 47.7(H)  Platelets 150.0 - 400.0 K/uL 243.0 298 291    Lab Results  Component Value Date   ALT 12 07/24/2016   AST 18 07/24/2016   ALKPHOS 64 07/24/2016   BILITOT 1.7 (H) 07/24/2016    Lab Results  Component Value Date   CREATININE 0.83 07/24/2016   BUN 19 07/24/2016   NA 134 (L) 07/24/2016   K 4.2 07/24/2016   CL 96 07/24/2016   CO2 30 07/24/2016      Physical Exam: BP 110/70 (BP Location: Left Arm, Patient Position: Sitting, Cuff Size: Normal)   Pulse 96   Ht 5\' 3"  (1.6 m)   Wt 155 lb (70.3 kg)   BMI 27.46 kg/m  Constitutional: Pleasant, uncomfortable female, holding mid abdomen HEENT: Normocephalic and atraumatic. Conjunctivae are normal. No scleral icterus. Neck supple.  Cardiovascular: Normal rate,  regular rhythm.  Pulmonary/chest: Effort normal and breath sounds normal. . Abdominal: Soft, nondistended, epigastric and RUQ TTP (more tender in epigastric region than RUQ) but no rebound or peritoneal signs. There are no masses palpable. No hepatomegaly. Extremities: no edema Lymphadenopathy: No cervical adenopathy noted. Neurological: Alert and oriented to person place and time. Skin: Skin is warm and dry. No rashes noted. Psychiatric: Normal mood and affect. Behavior is normal.   ASSESSMENT AND PLAN: 80 y/o female with history as above, presenting with persistent epigastric and right upper quadrant pain since symptoms began early this week. She has had labs and imaging with ultrasound and CT scan during her recent ER visit which did not  reveal a clear etiology, however, her symptoms persist and she continues to have severe pain. Large left renal cyst noted. I obtained repeat labs today and her white blood cell count is increased at 19 with left shift, total bilirubin up to 1.7 (although this is unfractionated).   It is not entirely clear what is driving this process based on her initial imaging, however rising white blood cell count is concerning and I feel she warrants repeated imaging today given how uncomfortable she appears, and may warrant admission pending this result. We have arranged for her to have a CT abdomen and pelvis this afternoon - will reassess biliary tree / pancreas, rule out diverticulitis, etc. She was tentatively scheduled for an EGD tomorrow prior to her labs coming back, however I don't think this would yield an etiology to cause her high white blood cell count, unless she had a perforated ulcer. We will await her imaging first. In the interim recommend she use the Zofran that was provided to her, and increase Protonix empirically to 40 mg twice daily until this is further sorted out. She should drink Gatorade or Pedialyte to prevent electrolyte imbalances and maintain  hydration.   Of note the patient has significant hearing loss and cannot hear using telephone. Her point of contact is her daughter named Mickel Baas, phone number is [phone]. I spoke with her daughter and explained my concerns and need for reimaging today, she agreed with plan and will make sure patient arrives to CT on time.   Leonardo Cellar, MD [facility] Gastroenterology Pager [phone]

## 2016-07-24 NOTE — Telephone Encounter (Signed)
Forwarded to Nicoletta Ba, PA

## 2016-07-24 NOTE — Consult Note (Signed)
[facility] Consult Note   History Angela Barber MRN # [id number]  Date of Admission: 07/24/2016 Date of Consultation: 07/24/2016 Referring physician: Dr. Sharlett Iles, MD  Reason for Consultation/Chief Complaint: Right upper quadrant pain  Subjective  HPI:  This is a 80 year old woman seen earlier today in the office by my partner Dr. Ardentown Cellar. I was called by the physician assistant in the [facility] to evaluate this patient for right upper quadrant pain. She was seen here 2 days ago for the same thing, labs and imaging consistent ultrasound were normal except for leukocytosis of 15,000. She was discharged home with pain medication, the pain slowly escalated until she saw Dr. Havery Moros earlier today. The pain was constantly in the right upper quadrant, it was nonradiating. She only had an episode of nausea and vomiting when it first happened 2 days ago, but no more since then. She is requiring pain medicine every 4-6 hours for some relief. She has never had an episode like this prior to its onset 2 days ago. Dr. Havery Moros found her to be very tender in the right upper quadrant, and thus sent her for a stat outpatient abdominal CT scan. This was reported to show a calculus cholecystitis, she was referred to the ED. Surgery was called and apparently recommended a HIDA scan.  ROS:  Constitutional:  No weight loss  Musculoskeletal: Chronic arthralgias  All other systems are negative except as noted above in the HPI  Past Medical History Past Medical History:  Diagnosis Date  . Anxiety   . C. difficile diarrhea 9/06  . Cochlear implant status   . Depression   . Diverticulosis   . Hearing loss of both ears   . HLD (hyperlipidemia)   . Hyperplastic colon polyp   . Insomnia   . Internal hemorrhoid   . Osteoarthritis, knee     Past Surgical History Past Surgical History:  Procedure Laterality Date  . BUNIONECTOMY     right  . COCHLEAR  IMPLANT  July '13   right ear. Dr. Idelle Crouch  . ROTATOR CUFF REPAIR Left 2012  . ROTATOR CUFF REPAIR Right 2007 & 2009  . TONSILLECTOMY    . VAGINAL HYSTERECTOMY      Family History Family History  Problem Relation Age of Onset  . Ovarian cancer Mother   . Transient ischemic attack Mother   . Breast cancer Maternal Aunt     aunts  . Stroke Sister   . Anxiety disorder Daughter     Social History Social History   Social History  . Marital status: Widowed    Spouse name: N/A  . Number of children: 2  . Years of education: Master's   Occupational History  . retired Retired   Social History Main Topics  . Smoking status: Former Research scientist (life sciences)  . Smokeless tobacco: Never Used  . Alcohol use Yes     Comment: 1 glass of wine a day  . Drug use: No  . Sexual activity: Not Asked   Other Topics Concern  . None   Social History Narrative   Patient is widowed and lives alone.   Patient has two adult children.   Patient is retired.   Patient has a Scientist, water quality.   Patient is right-handed.   Patient drinks two cups of coffee daily.    Allergies No Known Allergies  Outpatient Meds Home medications from the H+P and/or nursing med reconciliation reviewed.  Inpatient med list reviewed  _____________________________________________________________________ Objective  Exam:  Current vital signs  Patient Vitals for the past 8 hrs:  BP Temp Temp src Pulse Resp SpO2 Height Weight  07/24/16 2221 122/73 - - 91 - 94 % - -  07/24/16 2200 120/73 - - 92 - 92 % - -  07/24/16 2100 117/76 - - 92 - (!) 89 % - -  07/24/16 1947 129/79 - - 88 16 92 % - -  07/24/16 1805 138/82 - - - - - 5\' 3"  (1.6 m) 155 lb (70.3 kg)  07/24/16 1725 131/82 98 F (36.7 C) Oral 96 17 92 % - -   No intake or output data in the 24 hours ending 07/24/16 2229  Physical Exam:  She is nontoxic appearing. She is alert, conversational and appropriate albeit somewhat hard of hearing. Her grandson and husband  are at the bedside. She periodically grimaces when she has an episode of right upper quadrant pain.   Eyes: sclera anicteric, no redness  ENT: oral mucosa moist without lesions, no cervical or supraclavicular lymphadenopathy, good dentition  CV: RRR without murmur, S1/S2, no JVD,, no peripheral edema  Resp: clear to auscultation bilaterally, normal RR and effort noted  GI: soft, severe RUQ tenderness with a positive Murphy sign, with active bowel sounds. No  palpable organomegaly noted, but limited by IV tenderness.  Skin; warm and dry, no rash or jaundice noted  Neuro: awake, alert and oriented x 3. Normal gross motor function and fluent speech.  Labs:   Recent Labs Lab 07/22/16 0745 07/24/16 0934  WBC 15.0* 19.1 Repeated and verified X2.*  HGB 15.5* 14.8  HCT 43.9 43.0  PLT 298 243.0    Recent Labs Lab 07/24/16 0934  NA 134*  K 4.2  CL 96  CO2 30  BUN 19  ALBUMIN 3.9  ALKPHOS 64  ALT 12  AST 18  GLUCOSE 111*   No results for input(s): INR in the last 168 hours.  Radiologic studies: I personally reviewed the CT of abdomen and pelvis earlier today. No gallstones are seen, however there is markedly call blood or wall thickening with a large amount of pericholecystic fluid and also tracks down to the right paracolic gutter. There is a lot of reactive edema in the adjacent duodenum. CBD is reported at 9 mm, no intrahepatic biliary ductal dilatation is seen.  @ASSESSMENTPLANBEGIN @ Impression:  Acute acalculous cholecystitis Abnormal LFTs characterized by mildly elevated bilirubin due to cholestasis from cholecystitis. This pattern of LFTs is not consistent with a common bile duct obstruction.  I do not think further imaging is necessary to make the diagnosis. Plan:  I spoke with Dr.Gerkin of the surgical service, who plans to come in and evaluate the patient and most likely admit her to the surgical service. She is receiving a dose of Zosyn at this time.  I  will sign her out to our daytime consult service with Dr. Fuller Plan this week in case our services are necessary in the next day or two  Thank you for the courtesy of this consult.   Total time 60 minutes including all chart review patient evaluation a Medication with patient's care providers.  Nelida Meuse III Pager: [phone] Mon-Fri 8a-5p [phone] after 5p, weekends, holidays  CC: Dr. Hulan Saas

## 2016-07-24 NOTE — ED Notes (Signed)
Call to 5w unable to take pt at this time will call back when bed is ready bed

## 2016-07-24 NOTE — ED Triage Notes (Signed)
Patient was seen earlier today at dr office for right upper quad pain. Dr drew labs and seen her here to the ED for a Ct. After reciveing results he told the patient to return to the ER to have a cholecystectomy.

## 2016-07-24 NOTE — Telephone Encounter (Signed)
Pt was not able to drink all of the contrast. She has come home from the hospital and she is saying she is sleepy and cannot "think"

## 2016-07-24 NOTE — ED Notes (Signed)
Was seen at [facility] for abdominal pain-CT shows acute chole

## 2016-07-24 NOTE — Patient Instructions (Signed)
If you are age 78 or older, your body mass index should be between 23-30. Your Body mass index is 27.46 kg/m. If this is out of the aforementioned range listed, please consider follow up with your Primary Care Provider.  If you are age 36 or younger, your body mass index should be between 19-25. Your Body mass index is 27.46 kg/m. If this is out of the aformentioned range listed, please consider follow up with your Primary Care Provider.   You have been scheduled for an endoscopy. Please follow written instructions given to you at your visit today. If you use inhalers (even only as needed), please bring them with you on the day of your procedure. Your physician has requested that you go to www.startemmi.com and enter the access code given to you at your visit today. This web site gives a general overview about your procedure. However, you should still follow specific instructions given to you by our office regarding your preparation for the procedure.  Your physician has requested that you go to the basement for the following lab work before leaving today:  CBC, CMET, Lipase  Please take Protonix 40mg  twice daily.  Please take Zofran every 6 hours.  You may use Pepto or Maalox.  Please try Gatorade or Pedialyte to stay hydrated.

## 2016-07-24 NOTE — H&P (Signed)
Angela Barber is an 80 y.o. female.    Chief Complaint: abdomina pain, acalculous cholecystitis  HPI: patient is a 80 year old white female referred to the emergency department for admission to the surgical service by gastroenterology. Patient presents with a four-day history of substernal chest pain, epigastric abdominal pain, and right upper quadrant abdominal pain. She has experienced nausea and vomiting. Patient was evaluated in the emergency department on 07/22/2016. This included laboratory studies, ultrasound examination, and CT scan of the abdomen. No significant abnormalities were identified and the patient was discharged home. Symptoms of pain and anorexia persisted. She has not had further nausea or vomiting. Follow-up was arranged with gastroenterology and she was seen today. Laboratory studies were obtained and showed an elevated white blood cell count of 15,000 and a repeat showed an elevated white blood cell count of 19,000. CT scan of abdomen and pelvis now shows an enlarged gallbladder with gallbladder wall edema and pericholecystic fluid suspicious for acalculous cholecystitis. Patient was sent to the emergency department for surgical evaluation and management.  Patient has had hysterectomy. Otherwise no previous abdominal surgery.  There is a family history of cholecystectomy and the patient's daughter. Patient denies any history of hepatobiliary disease, jaundice, or acholic stools.  Past Medical History:  Diagnosis Date  . Anxiety   . C. difficile diarrhea 9/06  . Cochlear implant status   . Depression   . Diverticulosis   . Hearing loss of both ears   . HLD (hyperlipidemia)   . Hyperplastic colon polyp   . Insomnia   . Internal hemorrhoid   . Osteoarthritis, knee     Past Surgical History:  Procedure Laterality Date  . BUNIONECTOMY     right  . COCHLEAR IMPLANT  July '13   right ear. Dr. Idelle Crouch  . ROTATOR CUFF REPAIR Left 2012  . ROTATOR CUFF REPAIR Right  2007 & 2009  . TONSILLECTOMY    . VAGINAL HYSTERECTOMY      Family History  Problem Relation Age of Onset  . Ovarian cancer Mother   . Transient ischemic attack Mother   . Breast cancer Maternal Aunt     aunts  . Stroke Sister   . Anxiety disorder Daughter    Social History:  reports that she has quit smoking. She has never used smokeless tobacco. She reports that she drinks alcohol. She reports that she does not use drugs.  Allergies: No Known Allergies   (Not in a hospital admission)  Results for orders placed or performed during the hospital encounter of 07/24/16 (from the past 48 hour(s))  I-Stat CG4 Lactic Acid, ED     Status: None   Collection Time: 07/24/16  7:14 PM  Result Value Ref Range   Lactic Acid, Venous 0.74 0.5 - 1.9 mmol/L   Ct Abdomen Pelvis W Contrast  Result Date: 07/24/2016 CLINICAL DATA:  Upper abdominal pain EXAM: CT ABDOMEN AND PELVIS WITH CONTRAST TECHNIQUE: Multidetector CT imaging of the abdomen and pelvis was performed using the standard protocol following bolus administration of intravenous contrast. CONTRAST:  165mL ISOVUE-300 IOPAMIDOL (ISOVUE-300) INJECTION 61% COMPARISON:  None. FINDINGS: Lower chest: Small right pleural effusion identified. Subsegmental atelectasis versus scar noted in the right middle lobe and right lower lobe. Hepatobiliary: No focal liver abnormality. No intrahepatic bile duct dilatation. The gallbladder wall appears diffusely edematous and is enhancing. Pericholecystic fat stranding and free fluid is identified. The common bile duct is mildly increased in caliber measuring up to 9 mm. Pancreas: Unremarkable. No pancreatic  ductal dilatation or surrounding inflammatory changes. Spleen: Normal in size without focal abnormality. Adrenals/Urinary Tract: Normal adrenal glands. Large simple appearing left kidney cyst measures 10 cm, image 28 of series 2. No hydronephrosis. The urinary bladder appears normal. Stomach/Bowel: The stomach is  normal. The small bowel loops have a normal course and caliber without evidence for obstruction. The appendix is visualized and appears normal. Distal colonic diverticula identified without acute inflammation. Vascular/Lymphatic: Aortic atherosclerosis. No aneurysm. Porta hepatis node is enhancing measuring 8 mm. Enhancing portacaval lymph node Measures 1.4 cm, image number 29 of series 2. No pelvic or inguinal adenopathy. Reproductive: Status post hysterectomy. No adnexal masses. Other: No abdominal wall hernia or abnormality. Ascites is identified within the right upper quadrant of the abdomen Musculoskeletal: No aggressive lytic or sclerotic bone lesions. Degenerative disc disease noted within the lower lumbar spine. IMPRESSION: 1. Imaging findings are concerning for acute acalculous cholecystitis. There is gallbladder wall edema and enhancement with surrounding inflammatory changes including fat stranding and free fluid. 2. Enlarged upper abdominal lymph nodes are favored to be reactive. 3. Aortic atherosclerosis Electronically Signed   By: Kerby Moors M.D.   On: 07/24/2016 16:18    Review of Systems  Constitutional: Negative for chills, diaphoresis and fever.  HENT: Positive for hearing loss.   Eyes: Negative.   Respiratory: Negative.   Cardiovascular: Positive for chest pain.  Gastrointestinal: Positive for abdominal pain (epigastric and RUQ), nausea and vomiting. Negative for blood in stool, constipation and diarrhea.  Genitourinary: Negative.   Musculoskeletal: Negative.   Skin: Negative.   Neurological: Negative.  Negative for weakness.  Endo/Heme/Allergies: Negative.   Psychiatric/Behavioral: Negative.     Blood pressure 122/73, pulse 91, temperature 98 F (36.7 C), temperature source Oral, resp. rate 16, height 5\' 3"  (1.6 m), weight 70.3 kg (155 lb), SpO2 94 %. Physical Exam  Constitutional: She is oriented to person, place, and time. She appears well-developed and well-nourished.  No distress.  HENT:  Head: Normocephalic and atraumatic.  Right Ear: External ear normal.  Left Ear: External ear normal.  Eyes: Conjunctivae are normal. Pupils are equal, round, and reactive to light. No scleral icterus.  Neck: Normal range of motion. Neck supple. No tracheal deviation present. No thyromegaly present.  Cardiovascular: Normal rate, regular rhythm and normal heart sounds.   No murmur heard. Respiratory: Effort normal and breath sounds normal. No respiratory distress. She has no wheezes.  GI: Soft. Bowel sounds are normal. She exhibits no distension and no mass. There is tenderness (mild to moderate RUQ). There is guarding (voluntary). There is no rebound.  Musculoskeletal: Normal range of motion. She exhibits no edema or deformity.  Neurological: She is alert and oriented to person, place, and time.  Skin: Skin is warm and dry. She is not diaphoretic.  Psychiatric: She has a normal mood and affect. Her behavior is normal.     Assessment/Plan Acute acalculous cholecystitis  Admit to surgical service  Continue Zosyn IV as started by the ER  NPO, IV hydration  Plan cholecystectomy on 07/25/2016 by Dr. Jackolyn Confer  The risks and benefits of the procedure have been discussed at length with the patient.  The patient understands the proposed procedure, potential alternative treatments, and the course of recovery to be expected.  All of the patient's questions have been answered at this time.  The patient wishes to proceed with surgery.  Earnstine Regal, MD, [facility] Office: [place], MD 07/24/2016, 10:51  PM   

## 2016-07-25 ENCOUNTER — Encounter: Payer: Medicare Other | Admitting: Gastroenterology

## 2016-07-25 ENCOUNTER — Encounter (HOSPITAL_COMMUNITY): Payer: Self-pay | Admitting: General Surgery

## 2016-07-25 ENCOUNTER — Encounter (HOSPITAL_COMMUNITY)
Admission: EM | Disposition: A | Discharge status: Other Acute Inpatient Hospital | Payer: Self-pay | Source: Home / Self Care

## 2016-07-25 ENCOUNTER — Observation Stay (HOSPITAL_COMMUNITY): Payer: Medicare Other | Admitting: Certified Registered Nurse Anesthetist

## 2016-07-25 HISTORY — PX: CHOLECYSTECTOMY: SHX55

## 2016-07-25 LAB — COMPREHENSIVE METABOLIC PANEL
ALBUMIN: 3.1 g/dL — AB (ref 3.5–5.0)
ALT: 14 U/L (ref 14–54)
ALT: 26 U/L (ref 14–54)
ANION GAP: 5 (ref 5–15)
AST: 21 U/L (ref 15–41)
AST: 39 U/L (ref 15–41)
Albumin: 3.2 g/dL — ABNORMAL LOW (ref 3.5–5.0)
Alkaline Phosphatase: 49 U/L (ref 38–126)
Alkaline Phosphatase: 51 U/L (ref 38–126)
Anion gap: 7 (ref 5–15)
BILIRUBIN TOTAL: 1.2 mg/dL (ref 0.3–1.2)
BUN: 13 mg/dL (ref 6–20)
BUN: 11 mg/dL (ref 6–20)
CHLORIDE: 98 mmol/L — AB (ref 101–111)
CHLORIDE: 101 mmol/L (ref 101–111)
CO2: 30 mmol/L (ref 22–32)
CO2: 29 mmol/L (ref 22–32)
CREATININE: 0.69 mg/dL (ref 0.44–1.00)
Calcium: 8.3 mg/dL — ABNORMAL LOW (ref 8.9–10.3)
Calcium: 8.4 mg/dL — ABNORMAL LOW (ref 8.9–10.3)
Creatinine, Ser: 0.68 mg/dL (ref 0.44–1.00)
GFR calc Af Amer: >60 mL/min (ref >60)
GFR calc non Af Amer: >60 mL/min (ref >60)
GFR calc non Af Amer: >60 mL/min (ref >60)
GLUCOSE: 130 mg/dL — AB (ref 65–99)
Glucose, Bld: 135 mg/dL — ABNORMAL HIGH (ref 65–99)
POTASSIUM: 3.7 mmol/L (ref 3.5–5.1)
POTASSIUM: 4 mmol/L (ref 3.5–5.1)
SODIUM: 133 mmol/L — AB (ref 135–145)
SODIUM: 137 mmol/L (ref 135–145)
TOTAL PROTEIN: 5.7 g/dL — AB (ref 6.5–8.1)
Total Bilirubin: 1.5 mg/dL — ABNORMAL HIGH (ref 0.3–1.2)
Total Protein: 5.8 g/dL — ABNORMAL LOW (ref 6.5–8.1)

## 2016-07-25 LAB — SURGICAL PCR SCREEN
MRSA, PCR: NEGATIVE
Staphylococcus aureus: NEGATIVE

## 2016-07-25 SURGERY — LAPAROSCOPIC CHOLECYSTECTOMY
Anesthesia: General | Site: Abdomen

## 2016-07-25 MED ORDER — ROCURONIUM BROMIDE 50 MG/5ML IV SOSY
PREFILLED_SYRINGE | INTRAVENOUS | Status: AC
  Filled 2016-07-25: qty 5

## 2016-07-25 MED ORDER — SUGAMMADEX SODIUM 200 MG/2ML IV SOLN
INTRAVENOUS | Status: AC
  Filled 2016-07-25: qty 2

## 2016-07-25 MED ORDER — PHENYLEPHRINE 40 MCG/ML (10ML) SYRINGE FOR IV PUSH (FOR BLOOD PRESSURE SUPPORT)
PREFILLED_SYRINGE | INTRAVENOUS | Status: DC | PRN
  Administered 2016-07-25: 40 ug via INTRAVENOUS
  Administered 2016-07-25 (×2): 80 ug via INTRAVENOUS
  Administered 2016-07-25: 5 ug via INTRAVENOUS

## 2016-07-25 MED ORDER — SUCCINYLCHOLINE CHLORIDE 200 MG/10ML IV SOSY
PREFILLED_SYRINGE | INTRAVENOUS | Status: DC | PRN
  Administered 2016-07-25: 100 mg via INTRAVENOUS

## 2016-07-25 MED ORDER — LACTATED RINGERS IV SOLN
INTRAVENOUS | Status: DC | PRN
  Administered 2016-07-25: 1000 mL via INTRAVENOUS

## 2016-07-25 MED ORDER — HYDROMORPHONE HCL 1 MG/ML IJ SOLN: 0.2500 mg | INTRAMUSCULAR | Status: DC | PRN

## 2016-07-25 MED ORDER — ONDANSETRON HCL 4 MG/2ML IJ SOLN
INTRAMUSCULAR | Status: AC
  Filled 2016-07-25: qty 2

## 2016-07-25 MED ORDER — PROPOFOL 10 MG/ML IV BOLUS
INTRAVENOUS | Status: AC
  Filled 2016-07-25: qty 20

## 2016-07-25 MED ORDER — LIDOCAINE 2% (20 MG/ML) 5 ML SYRINGE
INTRAMUSCULAR | Status: DC | PRN
  Administered 2016-07-25: 70 mg via INTRAVENOUS

## 2016-07-25 MED ORDER — PROPOFOL 10 MG/ML IV BOLUS
INTRAVENOUS | Status: DC | PRN
  Administered 2016-07-25: 130 mg via INTRAVENOUS

## 2016-07-25 MED ORDER — ROCURONIUM BROMIDE 10 MG/ML (PF) SYRINGE
PREFILLED_SYRINGE | INTRAVENOUS | Status: DC | PRN
  Administered 2016-07-25: 35 mg via INTRAVENOUS

## 2016-07-25 MED ORDER — FENTANYL CITRATE (PF) 250 MCG/5ML IJ SOLN
INTRAMUSCULAR | Status: AC
  Filled 2016-07-25: qty 5

## 2016-07-25 MED ORDER — ONDANSETRON HCL 4 MG/2ML IJ SOLN
INTRAMUSCULAR | Status: DC | PRN
Start: 2016-07-25 — End: 2016-07-25
  Administered 2016-07-25: 4 mg via INTRAVENOUS

## 2016-07-25 MED ORDER — DEXAMETHASONE SODIUM PHOSPHATE 10 MG/ML IJ SOLN
INTRAMUSCULAR | Status: DC | PRN
  Administered 2016-07-25: 5 mg via INTRAVENOUS

## 2016-07-25 MED ORDER — DEXAMETHASONE SODIUM PHOSPHATE 10 MG/ML IJ SOLN
INTRAMUSCULAR | Status: AC
  Filled 2016-07-25: qty 1

## 2016-07-25 MED ORDER — LIDOCAINE 2% (20 MG/ML) 5 ML SYRINGE
INTRAMUSCULAR | Status: AC
  Filled 2016-07-25: qty 5

## 2016-07-25 MED ORDER — IOPAMIDOL (ISOVUE-300) INJECTION 61%
INTRAVENOUS | Status: AC
  Filled 2016-07-25: qty 50

## 2016-07-25 MED ORDER — LACTATED RINGERS IV SOLN
INTRAVENOUS | Status: DC | PRN
  Administered 2016-07-25 (×2): via INTRAVENOUS

## 2016-07-25 MED ORDER — SUGAMMADEX SODIUM 200 MG/2ML IV SOLN
INTRAVENOUS | Status: DC | PRN
  Administered 2016-07-25: 150 mg via INTRAVENOUS

## 2016-07-25 MED ORDER — ONDANSETRON HCL 4 MG/2ML IJ SOLN: 4.0000 mg | Freq: Once | INTRAMUSCULAR | Status: DC | PRN

## 2016-07-25 MED ORDER — BUPIVACAINE HCL 0.5 % IJ SOLN
INTRAMUSCULAR | Status: DC | PRN
  Administered 2016-07-25: 15 mL

## 2016-07-25 MED ORDER — MEPERIDINE HCL 50 MG/ML IJ SOLN: 6.2500 mg | INTRAMUSCULAR | Status: DC | PRN

## 2016-07-25 MED ORDER — SUCCINYLCHOLINE CHLORIDE 20 MG/ML IJ SOLN
INTRAMUSCULAR | Status: AC
  Filled 2016-07-25: qty 1

## 2016-07-25 MED ORDER — FENTANYL CITRATE (PF) 100 MCG/2ML IJ SOLN
INTRAMUSCULAR | Status: DC | PRN
  Administered 2016-07-25 (×3): 50 ug via INTRAVENOUS

## 2016-07-25 MED ORDER — HYDROMORPHONE HCL 1 MG/ML IJ SOLN
0.3000 mg | INTRAMUSCULAR | Status: DC | PRN
  Administered 2016-07-25 – 2016-07-31 (×7): 0.3 mg via INTRAVENOUS
  Administered 2016-07-31: 0.6 mg via INTRAVENOUS
  Filled 2016-07-25 (×8): qty 1

## 2016-07-25 MED ORDER — PIPERACILLIN-TAZOBACTAM 3.375 G IVPB
INTRAVENOUS | Status: AC
  Filled 2016-07-25: qty 50

## 2016-07-25 MED ORDER — BUPIVACAINE HCL (PF) 0.5 % IJ SOLN
INTRAMUSCULAR | Status: AC
  Filled 2016-07-25: qty 30

## 2016-07-25 SURGICAL SUPPLY — 49 items
APL SKNCLS STERI-STRIP NONHPOA (GAUZE/BANDAGES/DRESSINGS) ×2
APPLIER CLIP 5 13 M/L LIGAMAX5 (MISCELLANEOUS) ×4
APR CLP MED LRG 5 ANG JAW (MISCELLANEOUS) ×2
BAG SPEC RTRVL 10 TROC 200 (ENDOMECHANICALS) ×2
BAG SPEC RTRVL LRG 6X4 10 (ENDOMECHANICALS)
BENZOIN TINCTURE PRP APPL 2/3 (GAUZE/BANDAGES/DRESSINGS) ×4 IMPLANT
CABLE HIGH FREQUENCY MONO STRZ (ELECTRODE) ×4 IMPLANT
CATH REDDICK CHOLANGI 4FR 50CM (CATHETERS) ×4 IMPLANT
CHLORAPREP W/TINT 26ML (MISCELLANEOUS) ×4 IMPLANT
CLIP APPLIE 5 13 M/L LIGAMAX5 (MISCELLANEOUS) ×2 IMPLANT
CLOSURE STERI-STRIP 1/4X4 (GAUZE/BANDAGES/DRESSINGS) ×3 IMPLANT
CLOSURE WOUND 1/2 X4 (GAUZE/BANDAGES/DRESSINGS) ×1
COVER MAYO STAND STRL (DRAPES) ×4 IMPLANT
COVER SURGICAL LIGHT HANDLE (MISCELLANEOUS) ×4 IMPLANT
DECANTER SPIKE VIAL GLASS SM (MISCELLANEOUS) ×1 IMPLANT
DISSECTOR BLUNT TIP ENDO 5MM (MISCELLANEOUS) ×3 IMPLANT
DRAIN CHANNEL 19F RND (DRAIN) ×3 IMPLANT
DRAPE C-ARM 42X120 X-RAY (DRAPES) ×4 IMPLANT
DRSG TEGADERM 2-3/8X2-3/4 SM (GAUZE/BANDAGES/DRESSINGS) ×13 IMPLANT
ELECT REM PT RETURN 9FT ADLT (ELECTROSURGICAL) ×4
ELECTRODE REM PT RTRN 9FT ADLT (ELECTROSURGICAL) ×2 IMPLANT
ENDOLOOP SUT PDS II  0 18 (SUTURE)
ENDOLOOP SUT PDS II 0 18 (SUTURE) IMPLANT
EVACUATOR 1/8 PVC DRAIN (DRAIN) ×3 IMPLANT
GAUZE SPONGE 2X2 8PLY STRL LF (GAUZE/BANDAGES/DRESSINGS) ×2 IMPLANT
GLOVE ECLIPSE 8.0 STRL XLNG CF (GLOVE) ×4 IMPLANT
GLOVE INDICATOR 8.0 STRL GRN (GLOVE) ×4 IMPLANT
GOWN STRL REUS W/TWL XL LVL3 (GOWN DISPOSABLE) ×12 IMPLANT
HEMOSTAT SNOW SURGICEL 2X4 (HEMOSTASIS) ×6 IMPLANT
IRRIG SUCT STRYKERFLOW 2 WTIP (MISCELLANEOUS) ×4
IRRIGATION SUCT STRKRFLW 2 WTP (MISCELLANEOUS) ×2 IMPLANT
IV CATH 14GX2 1/4 (CATHETERS) ×4 IMPLANT
KIT BASIN OR (CUSTOM PROCEDURE TRAY) ×4 IMPLANT
POUCH RETRIEVAL ECOSAC 10 (ENDOMECHANICALS) ×1 IMPLANT
POUCH RETRIEVAL ECOSAC 10MM (ENDOMECHANICALS) ×2
POUCH SPECIMEN RETRIEVAL 10MM (ENDOMECHANICALS) IMPLANT
SCISSORS LAP 5X35 DISP (ENDOMECHANICALS) ×4 IMPLANT
SLEEVE XCEL OPT CAN 5 100 (ENDOMECHANICALS) ×8 IMPLANT
SPONGE GAUZE 2X2 STER 10/PKG (GAUZE/BANDAGES/DRESSINGS) ×2
STRIP CLOSURE SKIN 1/2X4 (GAUZE/BANDAGES/DRESSINGS) ×3 IMPLANT
SUT MNCRL AB 4-0 PS2 18 (SUTURE) ×7 IMPLANT
SUT NYLON 3 0 (SUTURE) ×3 IMPLANT
TOWEL OR 17X26 10 PK STRL BLUE (TOWEL DISPOSABLE) ×4 IMPLANT
TOWEL OR NON WOVEN STRL DISP B (DISPOSABLE) IMPLANT
TRAY LAPAROSCOPIC (CUSTOM PROCEDURE TRAY) ×4 IMPLANT
TROCAR BLADELESS OPT 5 100 (ENDOMECHANICALS) ×4 IMPLANT
TROCAR XCEL BLUNT TIP 100MML (ENDOMECHANICALS) ×4 IMPLANT
TROCAR XCEL NON-BLD 11X100MML (ENDOMECHANICALS) ×3 IMPLANT
TUBING INSUF HEATED (TUBING) ×4 IMPLANT

## 2016-07-25 NOTE — Anesthesia Preprocedure Evaluation (Signed)
Anesthesia Evaluation  Patient identified by MRN, date of birth, ID band Patient awake    Reviewed: Allergy & Precautions, NPO status , Patient's Chart, lab work & pertinent test results  Airway Mallampati: II  TM Distance: >3 FB Neck ROM: Full  Mouth opening: Limited Mouth Opening  Dental   Pulmonary former smoker,    Pulmonary exam normal        Cardiovascular Normal cardiovascular exam     Neuro/Psych Anxiety Depression    GI/Hepatic   Endo/Other    Renal/GU      Musculoskeletal   Abdominal   Peds  Hematology   Anesthesia Other Findings   Reproductive/Obstetrics                             Anesthesia Physical Anesthesia Plan  ASA: II  Anesthesia Plan: General   Post-op Pain Management:    Induction: Intravenous  Airway Management Planned: Oral ETT  Additional Equipment:   Intra-op Plan:   Post-operative Plan: Extubation in OR  Informed Consent: I have reviewed the patients History and Physical, chart, labs and discussed the procedure including the risks, benefits and alternatives for the proposed anesthesia with the patient or authorized representative who has indicated his/her understanding and acceptance.     Plan Discussed with: CRNA and Surgeon  Anesthesia Plan Comments:         Anesthesia Quick Evaluation

## 2016-07-25 NOTE — Anesthesia Procedure Notes (Signed)
Procedure Name: Intubation Date/Time: 07/25/2016 9:52 AM Performed by: Montel Clock Pre-anesthesia Checklist: Patient identified, Emergency Drugs available, Suction available and Patient being monitored Patient Re-evaluated:Patient Re-evaluated prior to inductionOxygen Delivery Method: Circle system utilized Preoxygenation: Pre-oxygenation with 100% oxygen Intubation Type: IV induction Ventilation: Mask ventilation without difficulty Laryngoscope Size: Mac and 3 Grade View: Grade II Tube type: Oral Number of attempts: 1 Airway Equipment and Method: Stylet and Oral airway Placement Confirmation: ETT inserted through vocal cords under direct vision,  positive ETCO2 and breath sounds checked- equal and bilateral Secured at: 20 cm Tube secured with: Tape Dental Injury: Teeth and Oropharynx as per pre-operative assessment

## 2016-07-25 NOTE — Anesthesia Postprocedure Evaluation (Signed)
Anesthesia Post Note  Patient: Angela Barber  Procedure(s) Performed: Procedure(s) (LRB): LAPAROSCOPIC CHOLECYSTECTOMY (N/A)  Patient location during evaluation: PACU Anesthesia Type: General Level of consciousness: awake and alert Pain management: pain level controlled Vital Signs Assessment: post-procedure vital signs reviewed and stable Respiratory status: spontaneous breathing, nonlabored ventilation, respiratory function stable and patient connected to nasal cannula oxygen Cardiovascular status: blood pressure returned to baseline and stable Postop Assessment: no signs of nausea or vomiting Anesthetic complications: no    Last Vitals:  Vitals:   07/25/16 1233 07/25/16 1330  BP: (!) 142/87 130/69  Pulse: 86 80  Resp: 14 16  Temp: 36.7 C 36.5 C    Last Pain:  Vitals:   07/25/16 1330  TempSrc: Oral  PainSc:                  Dell Hurtubise DAVID

## 2016-07-25 NOTE — Care Management Obs Status (Signed)
[place] NOTIFICATION   Patient Details  Name: BIJOU SHOULDERS MRN: [id number] Date of Birth: 11-Sep-1936   Medicare Observation Status Notification Given:  Yes    Guadalupe Maple, RN 07/25/2016, 2:48 PM

## 2016-07-25 NOTE — Progress Notes (Signed)
Patient seen and examined.  Grandson in room. Chart reviewed.  Plan laparoscopic, possible open cholecystectomy.  I have explained the procedure, risks, and aftercare of cholecystectomy.  Risks include but are not limited to bleeding, infection, wound problems, anesthesia, diarrhea, bile leak, injury to common bile duct/liver/intestine.  She seems to understand and agrees to proceed.

## 2016-07-25 NOTE — Op Note (Signed)
OPERATIVE NOTE-LAPAROSCOPIC CHOLECYSTECTOMY  Preoperative diagnosis:  Acute cholecystitis  Postoperative diagnosis:  Acute cholecystitis with necrosis  Procedure: Laparoscopic cholecystectomy.  Surgeon: Jackolyn Confer, M.D.  Asst.:  Izora Gala, [place]  Anesthesia: General  Findings:  The gallbladder was inflamed and necrotic. The cystic duct was necrotic. There was pus in the gallbladder.  Indication:   This is a 80 year old female who had approximately 4 days of right upper quadrant pain. She was seen in the emergency Department couple days ago with a negative workup. She saw one of the gastroenterologists yesterday and a repeat CT showed findings consistent with acute cholecystitis. White blood cell count went from 15,000-19,000. She's been started on intravenous antibiotics and is brought to the operating room now for cholecystectomy.  Technique: She was brought to the operating room, placed supine on the operating table, and a general anesthetic was administered.  The abdominal wall was then sterilely prepped and draped.  A timeout was performed.    Local anesthetic (Marcaine) was infiltrated in the subumbilical region. A small subumbilical incision was made through the skin, subcutaneous tissue, fascia, and peritoneum entering the peritoneal cavity under direct vision. A pursestring suture of 0 Vicryl was placed around the edges of the fascia. A Hassan trocar was introduced into the peritoneal cavity and a pneumoperitoneum was created by insufflation of carbon dioxide gas. The laparoscope was introduced into the trocar and no underlying bleeding or organ injury was noted. She was then placed in the reverse Trendelenburg position with the right side tilted slightly up.  Adhesions between the omentum and anterior abdominal wall on the right upper quadrant and omentum and gallbladder were noted.  Three 5 mm trocars were then placed into the abdominal cavity under laparoscopic vision. One in  the epigastric area, and 2 in the right upper quadrant area. The gallbladder was visualized.  Omental adhesions were separated from the anterior abdominal wall and gallbladder bluntly exposing a acutely inflamed and necrotic-appearing gallbladder with inflammatory rind around it. The fundus was grasped and retracted toward the right shoulder.  The body and infundibulum were mobilized with blunt dissection and hydrodissection dissection close to the gallbladder. During the dissection, the gallbladder was entered and purulent fluid was evacuated. The cystic duct was identified and a window was created around it. It was necrotic and avulsed  away from the gallbladder with minimal manipulation. A cholangiogram was unable to be performed. The biliary side was clipped 3 times. There was no bile leak. Cystic artery was identified in the triangle below and clipped and divided close to the gallbladder.  The epigastric trocar was upsized to an 11 mm trocar.   Following this the gallbladder was dissected free from the liver using electrocautery. The gallbladder was then placed in a retrieval bag and removed from the abdominal cavity through the subumbilical incision.  The gallbladder fossa was inspected, irrigated, and bleeding was controlled with electrocautery and Surgicel.  Inspection showed that hemostasis was adequate and there was no evidence of bile leak.  The irrigation fluid was evacuated as much as possible.  A #19 Blake drain was then placed into the abdominal cavity and positioned in the gallbladder fossa. It was brought out the lateral right upper quadrant trocar and anchored to the skin with 3-0 nylon suture.  The subumbilical trocar was removed and the fascial defect was closed by tightening and tying down the pursestring suture under laparoscopic vision.  The remaining trocars were removed and the pneumoperitoneum was released. The skin incisions were closed  with 4-0 Monocryl subcuticular stitches.  Steri-Strips and sterile dressings were applied.  The drain was hooked up to bulb suction.  The procedure was well-tolerated without any apparent complications. She was taken to the recovery room in satisfactory condition.  Estimated blood loss 300 cc. Because of the necrotic nature of the gallbladder cystic duct, I feel that she is at high risk for a bile leak.

## 2016-07-25 NOTE — Transfer of Care (Signed)
Immediate Anesthesia Transfer of Care Note  Patient: Angela Barber  Procedure(s) Performed: Procedure(s): LAPAROSCOPIC CHOLECYSTECTOMY (N/A)  Patient Location: PACU  Anesthesia Type:General  Level of Consciousness:  sedated, patient cooperative and responds to stimulation  Airway & Oxygen Therapy:Patient Spontanous Breathing and Patient connected to face mask oxgen  Post-op Assessment:  Report given to PACU RN and Post -op Vital signs reviewed and stable  Post vital signs:  Reviewed and stable  Last Vitals:  Vitals:   07/25/16 0451 07/25/16 0852  BP: 117/68 115/69  Pulse: 92 88  Resp: 14 16  Temp: 36.8 C [id number] C    Complications: No apparent anesthesia complications

## 2016-07-25 NOTE — Discharge Instructions (Signed)
[facility] ______[facility] °LAPAROSCOPIC CHOLECYSTECTOMY SURGERY: POST OP INSTRUCTIONS °Always review your discharge instruction sheet given to you by the facility where your surgery was performed. °IF YOU HAVE DISABILITY OR FAMILY LEAVE FORMS, YOU MUST BRING THEM TO THE OFFICE FOR PROCESSING.   °DO NOT GIVE THEM TO YOUR DOCTOR. ° °1. A prescription for pain medication may be given to you upon discharge.  Take your pain medication as prescribed, if needed.  If narcotic pain medicine is not needed, then you may take acetaminophen (Tylenol) or ibuprofen (Advil) as needed. °2. Take your usually prescribed medications unless otherwise directed. °3. If you need a refill on your pain medication, please contact your pharmacy.  They will contact our office to request authorization. Prescriptions will not be filled after 5pm or on week-ends. °4. You should follow a light diet the first few days after arrival home, such as soup and crackers, etc.  Be sure to include lots of fluids daily.  May start lowfat, solid foods 2 days after the surgery. °5. Most patients will experience some swelling and bruising in the area of the incisions.  Ice packs will help.  Swelling and bruising can take several days to resolve.  °6. It is common to experience some constipation if taking pain medication after surgery.  Increasing fluid intake and taking a stool softener (such as Colace) will usually help or prevent this problem from occurring.  A mild laxative (Milk of Magnesia or Miralax) should be taken according to package instructions if there are no bowel movements after 48 hours. °7. Unless discharge instructions indicate otherwise, you may remove your bandages 72 hours after surgery.  You may shower the day after surgery.  You may have steri-strips (small skin tapes) in place directly over the incision.  These strips should be left on the skin until they fall off.  If your surgeon used skin glue on the incision, you may  shower in 24 hours.  The glue will flake off over the next 2-3 weeks.  Any sutures or staples will be removed at the office during your follow-up visit. °8. ACTIVITIES:  You may resume regular (light) daily activities beginning the next day--such as daily self-care, walking, climbing stairs--gradually increasing activities as tolerated.  You may have sexual intercourse when it is comfortable.  Refrain from any heavy lifting or straining for two weeks.  Do not lift anything over 10 pounds during that time.  °a. You may drive when you are no longer taking prescription pain medication, you can comfortably wear a seatbelt, and you can safely maneuver your car and apply brakes. °b. RETURN TO WORK:  Desk type work in 1 week, full duty work in 2 weeks if you are pain-free.________________________________________________________ °9. You should see your doctor in the office for a follow-up appointment approximately 2-3 weeks after your surgery.  Make sure that you call for this appointment within a day or two after you arrive home to insure a convenient appointment time. °10. OTHER INSTRUCTIONS: __________________________________________________________________________________________________________________________ __________________________________________________________________________________________________________________________ °WHEN TO CALL YOUR DOCTOR: °1. Fever over 101.0 °2. Inability to urinate °3. Continued bleeding from incision. °4. Increased pain, redness, or drainage from the incision. °5. Increasing abdominal pain ° °The clinic staff is available to answer your questions during regular business hours.  Please don’t hesitate to call and ask to speak to one of the nurses for clinical concerns.  If you have a medical emergency, go to the nearest emergency room or call 911.  A surgeon from   [facility] is always on call at the hospital. °[address], [address], [place], [place]  [address] ?  [address] [address], [place], [place]   [address] °[phone] ? [phone] ? FAX [phone] °Web site: [email or web address] ° °

## 2016-07-25 NOTE — Telephone Encounter (Signed)
Pt was admitted to hospital yesterday after CT showed acalculous cholecystitis

## 2016-07-26 LAB — CBC
HEMATOCRIT: 34.8 % — AB (ref 36.0–46.0)
HEMOGLOBIN: 11.9 g/dL — AB (ref 12.0–15.0)
MCH: 31.6 pg (ref 26.0–34.0)
MCHC: 34.2 g/dL (ref 30.0–36.0)
MCV: 92.3 fL (ref 78.0–100.0)
Platelets: 219 10*3/uL (ref 150–400)
RBC: 3.77 MIL/uL — ABNORMAL LOW (ref 3.87–5.11)
RDW: 13.2 % (ref 11.5–15.5)
WBC: 8.9 10*3/uL (ref 4.0–10.5)

## 2016-07-26 NOTE — Progress Notes (Signed)
1 Day Post-Op lap chole Subjective: Doing well today, no nausea  Objective: Vital signs in last 24 hours: Temp:  [97.9 F (36.6 C)-98.3 F (36.8 C)] 98.2 F (36.8 C) (11/04 1324) Pulse Rate:  [65-82] 67 (11/04 1324) Resp:  [14-16] 16 (11/04 1324) BP: (100-127)/(55-84) 107/68 (11/04 1324) SpO2:  [92 %-98 %] 98 % (11/04 1324)   Intake/Output from previous day: 11/03 0701 - 11/04 0700 In: 3101.7 [I.V.:3001.7; IV Piggyback:100] Out: 2250 [Urine:2100; Drains:125; Blood:25] Intake/Output this shift: Total I/O In: 505 [I.V.:405; IV Piggyback:100] Out: 725 [Urine:700; Drains:25]   General appearance: alert and cooperative GI: soft, non-distended JP: SS fluid Incision: no significant drainage  Lab Results:   Recent Labs  07/24/16 0934 07/26/16 0521  WBC 19.1 Repeated and verified X2.* 8.9  HGB 14.8 11.9*  HCT 43.0 34.8*  PLT 243.0 219   BMET  Recent Labs  07/25/16 0431 07/25/16 1334  NA 133* 137  K 3.7 4.0  CL 98* 101  CO2 30 29  GLUCOSE 130* 135*  BUN 13 11  CREATININE 0.68 0.69  CALCIUM 8.3* 8.4*   PT/INR No results for input(s): LABPROT, INR in the last 72 hours. ABG No results for input(s): PHART, HCO3 in the last 72 hours.  Invalid input(s): PCO2, PO2  MEDS, Scheduled . piperacillin-tazobactam (ZOSYN)  IV  3.375 g Intravenous Q8H    Studies/Results: No results found.  Assessment: s/p Procedure(s): LAPAROSCOPIC CHOLECYSTECTOMY Patient Active Problem List   Diagnosis Date Noted  . Acute acalculous cholecystitis 07/24/2016  . Degenerative arthritis of right knee 06/04/2016  . Degenerative arthritis of left knee 04/10/2016  . Insomnia 03/20/2016  . Chest pain at rest   . Right knee pain 08/30/2015  . Symptomatic varicose veins of both lower extremities 07/25/2015  . Routine general medical examination at a health care facility 08/13/2014  . 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 Adjustment disorder with mixed anxiety and  depressed mood 05/19/2013  . Paresthesia of lower extremity 01/18/2012  . 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 Hyperlipidemia 05/24/2009  . Hearing loss 04/25/2008  . CLOSTRIDIUM DIFFICILE COLITIS, HX OF 10/23/2007    Tenuous cystic duct.  Concern for possible breakdown and bile leak.  Cont drain.  Will follow labs and drain output closely  Plan: Advance diet to clears Ambulate Cont IV antibiotics    LOS: 0 days     .Rosario Adie, [facility], [place]   07/26/2016 4:16 PM

## 2016-07-27 DIAGNOSIS — K9189 Other postprocedural complications and disorders of digestive system: Secondary | ICD-10-CM | POA: Diagnosis not present

## 2016-07-27 DIAGNOSIS — H9193 Unspecified hearing loss, bilateral: Secondary | ICD-10-CM | POA: Diagnosis present

## 2016-07-27 DIAGNOSIS — K819 Cholecystitis, unspecified: Secondary | ICD-10-CM | POA: Diagnosis present

## 2016-07-27 DIAGNOSIS — Z791 Long term (current) use of non-steroidal anti-inflammatories (NSAID): Secondary | ICD-10-CM | POA: Diagnosis not present

## 2016-07-27 DIAGNOSIS — Z87891 Personal history of nicotine dependence: Secondary | ICD-10-CM | POA: Diagnosis not present

## 2016-07-27 DIAGNOSIS — Z9621 Cochlear implant status: Secondary | ICD-10-CM | POA: Diagnosis present

## 2016-07-27 DIAGNOSIS — M1710 Unilateral primary osteoarthritis, unspecified knee: Secondary | ICD-10-CM | POA: Diagnosis present

## 2016-07-27 DIAGNOSIS — K810 Acute cholecystitis: Secondary | ICD-10-CM | POA: Diagnosis present

## 2016-07-27 DIAGNOSIS — K838 Other specified diseases of biliary tract: Secondary | ICD-10-CM | POA: Diagnosis not present

## 2016-07-27 DIAGNOSIS — Y92234 Operating room of hospital as the place of occurrence of the external cause: Secondary | ICD-10-CM | POA: Diagnosis not present

## 2016-07-27 DIAGNOSIS — Y832 Surgical operation with anastomosis, bypass or graft as the cause of abnormal reaction of the patient, or of later complication, without mention of misadventure at the time of the procedure: Secondary | ICD-10-CM | POA: Diagnosis not present

## 2016-07-27 DIAGNOSIS — Z538 Procedure and treatment not carried out for other reasons: Secondary | ICD-10-CM | POA: Diagnosis not present

## 2016-07-27 DIAGNOSIS — Z79899 Other long term (current) drug therapy: Secondary | ICD-10-CM | POA: Diagnosis not present

## 2016-07-27 DIAGNOSIS — Z86010 Personal history of colonic polyps: Secondary | ICD-10-CM | POA: Diagnosis not present

## 2016-07-27 DIAGNOSIS — K831 Obstruction of bile duct: Secondary | ICD-10-CM | POA: Diagnosis present

## 2016-07-27 DIAGNOSIS — Z7982 Long term (current) use of aspirin: Secondary | ICD-10-CM | POA: Diagnosis not present

## 2016-07-27 DIAGNOSIS — Z8719 Personal history of other diseases of the digestive system: Secondary | ICD-10-CM | POA: Diagnosis not present

## 2016-07-27 DIAGNOSIS — Z79891 Long term (current) use of opiate analgesic: Secondary | ICD-10-CM | POA: Diagnosis not present

## 2016-07-27 DIAGNOSIS — E871 Hypo-osmolality and hyponatremia: Secondary | ICD-10-CM | POA: Diagnosis present

## 2016-07-27 DIAGNOSIS — K828 Other specified diseases of gallbladder: Secondary | ICD-10-CM | POA: Diagnosis present

## 2016-07-27 LAB — COMPREHENSIVE METABOLIC PANEL
ALK PHOS: 42 U/L (ref 38–126)
ALT: 28 U/L (ref 14–54)
ANION GAP: 8 (ref 5–15)
AST: 31 U/L (ref 15–41)
Albumin: 2.9 g/dL — ABNORMAL LOW (ref 3.5–5.0)
BUN: 10 mg/dL (ref 6–20)
CALCIUM: 8.6 mg/dL — AB (ref 8.9–10.3)
CHLORIDE: 105 mmol/L (ref 101–111)
CO2: 27 mmol/L (ref 22–32)
CREATININE: 0.63 mg/dL (ref 0.44–1.00)
Glucose, Bld: 101 mg/dL — ABNORMAL HIGH (ref 65–99)
Potassium: 4.2 mmol/L (ref 3.5–5.1)
Sodium: 140 mmol/L (ref 135–145)
Total Bilirubin: 0.8 mg/dL (ref 0.3–1.2)
Total Protein: 5.5 g/dL — ABNORMAL LOW (ref 6.5–8.1)

## 2016-07-27 LAB — CBC
HCT: 37.4 % (ref 36.0–46.0)
Hemoglobin: 12.3 g/dL (ref 12.0–15.0)
MCH: 31 pg (ref 26.0–34.0)
MCHC: 32.9 g/dL (ref 30.0–36.0)
MCV: 94.2 fL (ref 78.0–100.0)
PLATELETS: 252 10*3/uL (ref 150–400)
RBC: 3.97 MIL/uL (ref 3.87–5.11)
RDW: 13.3 % (ref 11.5–15.5)
WBC: 9 10*3/uL (ref 4.0–10.5)

## 2016-07-27 NOTE — Progress Notes (Signed)
Pt with c/o of pain in the RUQ abdominal area and heartburn. Pain meds and antiemetic given per request. Family at the bedside. IVF's infusing without difficulty.  Vital signs taken, see flow sheet.   Marked old drainage JP gauze site. JP emptied and recharged. Family with emotional support. Will continue to monitor per MD orders and unit protocol.

## 2016-07-27 NOTE — Progress Notes (Signed)
2 Days Post-Op lap chole Subjective: Doing well today, no nausea.  Had some R shoulder pain after trying clears last night  Objective: Vital signs in last 24 hours: Temp:  [97.8 F (36.6 C)-98.3 F (36.8 C)] 97.8 F (36.6 C) (11/05 0636) Pulse Rate:  [66-84] 73 (11/05 0636) Resp:  [16-20] 20 (11/05 0636) BP: (107-152)/(57-90) 119/71 (11/05 0636) SpO2:  [96 %-98 %] 96 % (11/05 0636)   Intake/Output from previous day: 11/04 0701 - 11/05 0700 In: 2224.6 [P.O.:420; I.V.:1604.6; IV Piggyback:200] Out: 1750 [Urine:1625; Drains:125] Intake/Output this shift: Total I/O In: -  Out: 250 [Urine:250]   General appearance: alert and cooperative GI: soft, non-distended JP: SS fluid Incision: no significant drainage  Lab Results:   Recent Labs  07/26/16 0521 07/27/16 0437  WBC 8.9 9.0  HGB 11.9* 12.3  HCT 34.8* 37.4  PLT 219 252   BMET  Recent Labs  07/25/16 1334 07/27/16 0437  NA 137 140  K 4.0 4.2  CL 101 105  CO2 29 27  GLUCOSE 135* 101*  BUN 11 10  CREATININE 0.69 0.63  CALCIUM 8.4* 8.6*   PT/INR No results for input(s): LABPROT, INR in the last 72 hours. ABG No results for input(s): PHART, HCO3 in the last 72 hours.  Invalid input(s): PCO2, PO2  MEDS, Scheduled . piperacillin-tazobactam (ZOSYN)  IV  3.375 g Intravenous Q8H    Studies/Results: No results found.  Assessment: s/p Procedure(s): LAPAROSCOPIC CHOLECYSTECTOMY Patient Active Problem List   Diagnosis Date Noted  . Acute acalculous cholecystitis 07/24/2016  . Degenerative arthritis of right knee 06/04/2016  . Degenerative arthritis of left knee 04/10/2016  . Insomnia 03/20/2016  . Chest pain at rest   . Right knee pain 08/30/2015  . Symptomatic varicose veins of both lower extremities 07/25/2015  . Routine general medical examination at a health care facility 08/13/2014  . 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 Adjustment disorder with mixed anxiety and depressed mood  05/19/2013  . Paresthesia of lower extremity 01/18/2012  . 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 Hyperlipidemia 05/24/2009  . Hearing loss 04/25/2008  . CLOSTRIDIUM DIFFICILE COLITIS, HX OF 10/23/2007    Tenuous cystic duct.  Concern for possible breakdown and bile leak.  Cont drain.  Will follow labs and drain output closely  Plan: Advance diet to full liquids Ambulate Cont IV antibiotics    LOS: 0 days     .Rosario Adie, MD [facility], [place]   07/27/2016 9:37 AM

## 2016-07-28 ENCOUNTER — Telehealth: Payer: Self-pay

## 2016-07-28 MED ORDER — POLYETHYLENE GLYCOL 3350 17 G PO PACK
17.0000 g | PACK | Freq: Every day | ORAL | Status: DC
  Administered 2016-07-29 – 2016-07-30 (×2): 17 g via ORAL
  Filled 2016-07-28 (×2): qty 1

## 2016-07-28 NOTE — Telephone Encounter (Signed)
Unable to reach patient. No number left to call. Patient is deaf.

## 2016-07-28 NOTE — Progress Notes (Signed)
[facility] Progress Note  3 Days Post-Op  Subjective: Abdominal pain 4/10. Denies nausea/vomiting. Tolerating PO but doesn't enjoy the food here - I told her it is ok for her family to bring her small amounts of soft, bland foods. +flatus. Denies BM. Ambulating regularly in the halls.  Drain output slightly increased, still serosanguinous. No bilious fluid.  Lives at home alone and expresses some hesitation about being home alone after discharge.   Objective: Vital signs in last 24 hours: Temp:  [97.9 F (36.6 C)-98.3 F (36.8 C)] 97.9 F (36.6 C) (11/06 0549) Pulse Rate:  [59-79] 79 (11/06 0549) Resp:  [16-18] 16 (11/06 0549) BP: (103-120)/(65-70) 112/65 (11/06 0549) SpO2:  [96 %-99 %] 98 % (11/06 0549) Last BM Date: 07/22/16  Intake/Output from previous day: 11/05 0701 - 11/06 0700 In: 2040 [P.O.:240; I.V.:1800] Out: [id number] [Urine:4025; Drains:150] Intake/Output this shift: No intake/output data recorded.  PE: Gen:  Alert, NAD, pleasant and cooperative Pulm:  CTABL, no W/R/R Abd: Soft, appropriately tender, mild distention, +BS, incisions C/D/I, drain with ~20 cc sanguinous drainage  Lab Results:   Recent Labs  07/26/16 0521 07/27/16 0437  WBC 8.9 9.0  HGB 11.9* 12.3  HCT 34.8* 37.4  PLT 219 252   BMET  Recent Labs  07/25/16 1334 07/27/16 0437  NA 137 140  K 4.0 4.2  CL 101 105  CO2 29 27  GLUCOSE 135* 101*  BUN 11 10  CREATININE 0.69 0.63  CALCIUM 8.4* 8.6*   CMP     Component Value Date/Time   NA 140 07/27/2016 0437   K 4.2 07/27/2016 0437   CL 105 07/27/2016 0437   CO2 27 07/27/2016 0437   GLUCOSE 101 (H) 07/27/2016 0437   BUN 10 07/27/2016 0437   CREATININE 0.63 07/27/2016 0437   CREATININE 0.85 05/05/2016 0813   CALCIUM 8.6 (L) 07/27/2016 0437   PROT 5.5 (L) 07/27/2016 0437   ALBUMIN 2.9 (L) 07/27/2016 0437   AST 31 07/27/2016 0437   ALT 28 07/27/2016 0437   ALKPHOS 42 07/27/2016 0437   BILITOT 0.8 07/27/2016 0437   GFRNONAA >60 07/27/2016 0437   GFRAA >60 07/27/2016 0437   Lipase     Component Value Date/Time   LIPASE 5.0 (L) 07/24/2016 0934   Anti-infectives: Anti-infectives    Start     Dose/Rate Route Frequency Ordered Stop   07/25/16 0400  piperacillin-tazobactam (ZOSYN) IVPB 3.375 g     3.375 g 12.5 mL/hr over 240 Minutes Intravenous Every 8 hours 07/24/16 2302     07/24/16 1845  piperacillin-tazobactam (ZOSYN) IVPB 3.375 g     3.375 g 100 mL/hr over 30 Minutes Intravenous  Once 07/24/16 1837 07/24/16 2222     Assessment/Plan S/p laparoscopic cholecystectomy, placement 52F blake drain 07/25/16 Dr. Zella Richer  Per op note: necrosis of GB/cystic duct, monitor labs/drain output closely for bile leak  Drain OP 150 cc/24h, slightly increased, serosanguinous   WBC/LFT's WNL  Pain controlled with NORCO  FEN: soft diet ID: Zosyn 11/3 >>  VTE: SCD's   Dispo: advance diet to soft, monitor drain output Will discuss timing of discharge with MD Will need to go home with drain in place.      LOS: 1 day    [facility] 07/28/2016, 8:51 AM Pager: [phone] Consults: [phone] Mon-Fri 7:00 am-4:30 pm Sat-Sun 7:00 am-11:30 am

## 2016-07-29 NOTE — Progress Notes (Signed)
[facility] Progress Note  4 Days Post-Op  Subjective: Abdominal pain improving. Had a non-bloody BM this morning. Tolerated a soft breakfast without nausea/vomiting. Ambulated a lot yesterday.   Reports increased drainage from bulb drain. Per nurse, and documentation in epic, drainage is stable. Denies green drainage into bulb.  Objective: Vital signs in last 24 hours: Temp:  [97.6 F (36.4 C)-98 F (36.7 C)] 98 F (36.7 C) (11/07 0615) Pulse Rate:  [77-81] 77 (11/07 0615) Resp:  [18] 18 (11/07 0615) BP: (122-131)/(69-87) 131/87 (11/07 0615) SpO2:  [98 %-100 %] 98 % (11/07 0615) Last BM Date: 07/22/16  Intake/Output from previous day: 11/06 0701 - 11/07 0700 In: 2220 [P.O.:1320; I.V.:900] Out: [id number] [Urine:3950; Drains:145] Intake/Output this shift: Total I/O In: 240 [P.O.:240] Out: 300 [Urine:300]  PE: Gen:  Alert, NAD, pleasant and cooperative Pulm:  CTABL, no W/R/R Abd: Soft, appropriately tender, mild distention, +BS, incisions C/D/I, drain with ~40 cc serosanguinous output - emptied at bedside.  Lab Results:   Recent Labs  07/27/16 0437  WBC 9.0  HGB 12.3  HCT 37.4  PLT 252   BMET  Recent Labs  07/27/16 0437  NA 140  K 4.2  CL 105  CO2 27  GLUCOSE 101*  BUN 10  CREATININE 0.63  CALCIUM 8.6*   CMP     Component Value Date/Time   NA 140 07/27/2016 0437   K 4.2 07/27/2016 0437   CL 105 07/27/2016 0437   CO2 27 07/27/2016 0437   GLUCOSE 101 (H) 07/27/2016 0437   BUN 10 07/27/2016 0437   CREATININE 0.63 07/27/2016 0437   CREATININE 0.85 05/05/2016 0813   CALCIUM 8.6 (L) 07/27/2016 0437   PROT 5.5 (L) 07/27/2016 0437   ALBUMIN 2.9 (L) 07/27/2016 0437   AST 31 07/27/2016 0437   ALT 28 07/27/2016 0437   ALKPHOS 42 07/27/2016 0437   BILITOT 0.8 07/27/2016 0437   GFRNONAA >60 07/27/2016 0437   GFRAA >60 07/27/2016 0437   Lipase     Component Value Date/Time   LIPASE 5.0 (L) 07/24/2016 0934   Anti-infectives: Anti-infectives     Start     Dose/Rate Route Frequency Ordered Stop   07/25/16 0400  piperacillin-tazobactam (ZOSYN) IVPB 3.375 g     3.375 g 12.5 mL/hr over 240 Minutes Intravenous Every 8 hours 07/24/16 2302     07/24/16 1845  piperacillin-tazobactam (ZOSYN) IVPB 3.375 g     3.375 g 100 mL/hr over 30 Minutes Intravenous  Once 07/24/16 1837 07/24/16 2222     Assessment/Plan S/p laparoscopic cholecystectomy, placement 56F blake drain 07/25/16 Dr. Zella Richer             Per op note: necrosis of GB/cystic duct, monitor labs/drain output closely for bile leak             Drain OP 145 cc/24h, serosanguinous              WBC/LFT's WNL, VSS             Pain controlled with NORCO  FEN: soft diet, advance to low fat diet as tolerated  ID: Zosyn 11/3 >>  VTE: SCD's   Dispo:  Having bowel function, tolerating diet, ambulating well  Anticipate discharge tomorrow w/ HH for drain care- will discuss with MD.   LOS: 2 days    Jill Alexanders , [facility] 07/29/2016, 9:51 AM Pager: [phone] Consults: [phone] Mon-Fri 7:00 am-4:30 pm Sat-Sun 7:00 am-11:30 am

## 2016-07-29 NOTE — Progress Notes (Signed)
Discharge planning, spoke with patient at beside. Chose [facility] for [facility] services, contacted [facility] for referral. [phone]

## 2016-07-30 DIAGNOSIS — K9189 Other postprocedural complications and disorders of digestive system: Secondary | ICD-10-CM

## 2016-07-30 DIAGNOSIS — K838 Other specified diseases of biliary tract: Secondary | ICD-10-CM

## 2016-07-30 LAB — COMPREHENSIVE METABOLIC PANEL
ALT: 43 U/L (ref 14–54)
ANION GAP: 10 (ref 5–15)
AST: 31 U/L (ref 15–41)
Albumin: 3.3 g/dL — ABNORMAL LOW (ref 3.5–5.0)
Alkaline Phosphatase: 49 U/L (ref 38–126)
BILIRUBIN TOTAL: 1 mg/dL (ref 0.3–1.2)
BUN: 7 mg/dL (ref 6–20)
CHLORIDE: 103 mmol/L (ref 101–111)
CO2: 26 mmol/L (ref 22–32)
Calcium: 9.1 mg/dL (ref 8.9–10.3)
Creatinine, Ser: 0.91 mg/dL (ref 0.44–1.00)
GFR calc Af Amer: >60 mL/min (ref >60)
GFR, EST NON AFRICAN AMERICAN: 59 mL/min — AB (ref >60)
GLUCOSE: 131 mg/dL — AB (ref 65–99)
POTASSIUM: 4 mmol/L (ref 3.5–5.1)
Sodium: 139 mmol/L (ref 135–145)
Total Protein: 6.5 g/dL (ref 6.5–8.1)

## 2016-07-30 NOTE — Consult Note (Signed)
Re-Consultation  Referring Provider: Obie Dredge, PA-C Primary Care Physician:  Hoyt Koch, MD Primary Gastroenterologist: Dr. Olevia Perches         Reason for Consultation:  Bile leak after Cholecystectomy last week            HPI:   Angela Barber is a 80 y.o. Caucasian female with a past medical history of anxiety, depression, diverticulosis, hyperlipidemia and osteoarthritis who we were initially consulted on regarding right upper quadrant pain on 07/24/16. Please see consult note from Dr. Loletha Carrow at that time. Briefly, the patient had been seen by Dr. Havery Moros earlier that day in the office with a constant right upper quadrant pain which was nonradiating associated with nausea and vomiting, requiring pain medicine every 4-6 hours. She was found to be very tender on exam and was sent for a stat outpatient abdominal CT. This showed an acalculous cholecystitis and CBD measured 9 mm with no intrahepatic biliary ductal dilation and she was referred to the ED. Surgery was called and recommended a HIDA scan. She was seen by surgery on Monday and plan was for a cholecystectomy on 07/25/2016. Findings during this procedure were an inflamed and necrotic gallbladder as well as cystic duct. Percutaneous drain was placed. There was concern for bile leak on 07/27/2016 due to the tenuous cystic duct. This morning the drain output increased from 145 mL to 280 mL over the past 24 hours. She was diagnosed with a bile leak and we were re-consulted today.   Patient tells me that she was tolerating a soft diet, but continues with right upper quadrant pain which is only helped with pain medications. She did have a bowel movement yesterday with the help of the stool softener. She is aware of plans for ERCP but does ask me multiple questions regarding this procedure.   Patient denies fever, chills, blood in her stool, melena, nausea, vomiting or change in abdominal pain.  Past Medical History:  Diagnosis  Date  . Anxiety   . C. difficile diarrhea 9/06  . Cochlear implant status   . Depression   . Diverticulosis   . Hearing loss of both ears   . HLD (hyperlipidemia)   . Hyperplastic colon polyp   . Insomnia   . Internal hemorrhoid   . Osteoarthritis, knee     Past Surgical History:  Procedure Laterality Date  . BUNIONECTOMY     right  . CHOLECYSTECTOMY N/A 07/25/2016   Procedure: LAPAROSCOPIC CHOLECYSTECTOMY;  Surgeon: Jackolyn Confer, MD;  Location: [facility];  Service: General;  Laterality: N/A;  Royann Shivers IMPLANT  July '13   right ear. Dr. Idelle Crouch  . ROTATOR CUFF REPAIR Left 2012  . ROTATOR CUFF REPAIR Right 2007 & 2009  . TONSILLECTOMY    . VAGINAL HYSTERECTOMY      Family History  Problem Relation Age of Onset  . Ovarian cancer Mother   . Transient ischemic attack Mother   . Breast cancer Maternal Aunt     aunts  . Stroke Sister   . Anxiety disorder Daughter     Social History  Substance Use Topics  . Smoking status: Former Research scientist (life sciences)  . Smokeless tobacco: Never Used  . Alcohol use Yes     Comment: 1 glass of wine a day    Prior to Admission medications   Medication Sig Start Date End Date Taking? Authorizing Provider  acetaminophen (TYLENOL) 500 MG tablet Take 500 mg by mouth at bedtime as needed for mild  pain (Pt takes in combination with Tylenol PM).    Yes Historical Provider, MD  aspirin 81 MG tablet Take 81 mg by mouth daily.   Yes Historical Provider, MD  bifidobacterium infantis (ALIGN) capsule Take 1 capsule by mouth daily.   Yes Historical Provider, MD  diclofenac sodium (VOLTAREN) 1 % GEL Apply 2 g topically 4 (four) times daily. To affected joint. Patient taking differently: Apply 2 g topically 2 (two) times daily as needed (pain). To affected joint. 05/16/16  Yes Lyndal Pulley, DO  diphenhydramine-acetaminophen (TYLENOL PM) 25-500 MG TABS tablet Take 1 tablet by mouth at bedtime as needed (sleep). Pt takes in combination with 1 tablet of 500mg  tylenol  at night as needed   Yes Historical Provider, MD  nitroGLYCERIN (NITROSTAT) 0.4 MG SL tablet Place 1 tablet (0.4 mg total) under the tongue every 5 (five) minutes as needed for chest pain. 03/20/16  Yes Thurnell Lose, MD  ondansetron (ZOFRAN) 4 MG tablet Take 1 tablet (4 mg total) by mouth every 6 (six) hours as needed for nausea or vomiting. 07/22/16  Yes Julianne Rice, MD  pantoprazole (PROTONIX) 20 MG tablet Take 1 tablet (20 mg total) by mouth daily. Patient taking differently: Take 40 mg by mouth 2 (two) times daily.  07/22/16  Yes Julianne Rice, MD  traMADol (ULTRAM) 50 MG tablet Take 1 tablet (50 mg total) by mouth every 6 (six) hours as needed. 07/22/16  Yes Julianne Rice, MD    Current Facility-Administered Medications  Medication Dose Route Frequency Provider Last Rate Last Dose  . acetaminophen (TYLENOL) tablet 650 mg  650 mg Oral Q6H PRN Armandina Gemma, MD       Or  . acetaminophen (TYLENOL) suppository 650 mg  650 mg Rectal Q6H PRN Armandina Gemma, MD      . dextrose 5 % and 0.45 % NaCl with KCl 20 mEq/L infusion   Intravenous Continuous Leighton Ruff, MD 75 mL/hr at 07/29/16 2312    . HYDROcodone-acetaminophen (NORCO/VICODIN) 5-325 MG per tablet 1-2 tablet  1-2 tablet Oral Q4H PRN Armandina Gemma, MD   2 tablet at 07/29/16 2042  . HYDROmorphone (DILAUDID) injection 0.3-0.6 mg  0.3-0.6 mg Intravenous Q2H PRN Jackolyn Confer, MD   0.3 mg at 07/26/16 2234  . ondansetron (ZOFRAN-ODT) disintegrating tablet 4 mg  4 mg Oral Q6H PRN Armandina Gemma, MD       Or  . ondansetron [facility]) injection 4 mg  4 mg Intravenous Q6H PRN Armandina Gemma, MD   4 mg at 07/26/16 2207  . piperacillin-tazobactam (ZOSYN) IVPB 3.375 g  3.375 g Intravenous Q8H Armandina Gemma, MD   3.375 g at 07/30/16 0342  . polyethylene glycol (MIRALAX / GLYCOLAX) packet 17 g  17 g Oral Daily Jill Alexanders, PA-C   17 g at 07/29/16 0850    Allergies as of 07/24/2016  . (No Known Allergies)     Review of Systems:      Constitutional: No weight loss, fever, chills, weakness or fatigue HEENT: Eyes: No change in vision               Ears, Nose, Throat:  No change in hearing or congestion Skin: No rash or itching Cardiovascular: No chest pain, chest pressure or palpitations   Respiratory: No SOB or cough Gastrointestinal: See HPI and otherwise negative Genitourinary: No dysuria or change in urinary frequency Neurological: No headache, dizziness or syncope Musculoskeletal: No new muscle or joint pain Hematologic: No bleeding or bruising Psychiatric: No  history of depression or anxiety   Physical Exam:  Vital signs in last 24 hours: Temp:  [97.5 F (36.4 C)-98.7 F (37.1 C)] 98.1 F (36.7 C) (11/08 0538) Pulse Rate:  [84-110] 84 (11/08 0538) Resp:  [16-18] 18 (11/08 0538) BP: (118-137)/(60-85) 137/72 (11/08 0538) SpO2:  [97 %-100 %] 98 % (11/08 0538) Last BM Date: 07/29/16 General:   Pleasant elderly Caucasian female appears to be in NAD, Well developed, Well nourished, alert and cooperative Head:  Normocephalic and atraumatic. Eyes:   PEERL, EOMI. No icterus. Conjunctiva pink. Ears:  Normal auditory acuity. Neck:  Supple Throat: Oral cavity and pharynx without inflammation, swelling or lesion.  Lungs: Respirations even and unlabored. Lungs clear to auscultation bilaterally.   No wheezes, crackles, or rhonchi.  Heart: Normal S1, S2. No MRG. Regular rate and rhythm. No peripheral edema, cyanosis or pallor.  Abdomen:  Soft, nondistended, Tender in RUQ and around surgical scars, drain present in RUQ with dark, bile tinged fluid. No rebound or guarding. Normal bowel sounds. No appreciable masses or hepatomegaly. Rectal:  Not performed.  Msk:  Symmetrical without gross deformities. Peripheral pulses intact.  Extremities:  Without edema, no deformity or joint abnormality.  Neurologic:  Alert and  oriented x4;  grossly normal neurologically.  Skin:   Dry and intact without significant lesions or  rashes. Psychiatric: Oriented to person, place and time. Demonstrates good judgement and reason without abnormal affect or behaviors.    Impression / Plan:   Impression: 1. Status post laparoscopic cholecystectomy on 07/25/16, placement of 19 [place] Blake drain-patient was found to have necrotic gallbladder and cystic duct at time of operation, drain was placed and bile output recently increased from 145-280 mL overnight, concern for bile leak  Plan: 1. Continue supportive measures. 2. Plan for ERCP tomorrow with Dr. Ardis Hughs for stent placement. Discussed risks, benefits, limitations and alternatives and the patient agrees to proceed.  3.Patient may continue diet today but will be nothing by mouth after midnight 4. Will discuss above with Dr. Ardis Hughs, please await any further recommendations  Thank you for your kind consultation, we will continue to follow.  Lavone Nian [facility]  07/30/2016, 8:46 AM Pager #: [phone]   ________________________________________________________________________  Velora Heckler GI MD note:  I personally examined the patient, reviewed the data and agree with the assessment and plan described above.  Obvious controlled bile leak (green thin bile in JP drain following complicated cholecystectomy).  I discussed risks (specifically pancreatitis, perforation, bleeding as well as other) to ERCP with her and her husband. Planning to proceed tomorrow AM (currently on schedule for 10am).  Owens Loffler, MD [facility] Pager [phone]

## 2016-07-30 NOTE — Progress Notes (Signed)
[facility] Progress Note  5 Days Post-Op  Subjective: Pain controlled, limited to RUQ. Tolerating soft diet. +flatus and had a BM yesterday. Denies nausea/vomiting. ambulating regularly.   Objective: Vital signs in last 24 hours: Temp:  [97.5 F (36.4 C)-98.7 F (37.1 C)] 98.1 F (36.7 C) (11/08 0538) Pulse Rate:  [84-110] 84 (11/08 0538) Resp:  [16-18] 18 (11/08 0538) BP: (118-137)/(60-85) 137/72 (11/08 0538) SpO2:  [97 %-100 %] 98 % (11/08 0538) Last BM Date: 07/29/16  Intake/Output from previous day: 11/07 0701 - 11/08 0700 In: 2920 [P.O.:720; I.V.:1800; IV Piggyback:400] Out: [id number] [Urine:3300; Drains:270] Intake/Output this shift: No intake/output data recorded.  PE: Gen: Alert, NAD, pleasant and cooperative Pulm: CTABL, no W/R/R Abd: Soft, appropriately tender, mild distention,+BS, incisions C/D/I, dressings removed, drain with dark, bile-tinged fluid.  Lab Results:  No results for input(s): WBC, HGB, HCT, PLT in the last 72 hours. BMET No results for input(s): NA, K, CL, CO2, GLUCOSE, BUN, CREATININE, CALCIUM in the last 72 hours. PT/INR No results for input(s): LABPROT, INR in the last 72 hours. CMP     Component Value Date/Time   NA 140 07/27/2016 0437   K 4.2 07/27/2016 0437   CL 105 07/27/2016 0437   CO2 27 07/27/2016 0437   GLUCOSE 101 (H) 07/27/2016 0437   BUN 10 07/27/2016 0437   CREATININE 0.63 07/27/2016 0437   CREATININE 0.85 05/05/2016 0813   CALCIUM 8.6 (L) 07/27/2016 0437   PROT 5.5 (L) 07/27/2016 0437   ALBUMIN 2.9 (L) 07/27/2016 0437   AST 31 07/27/2016 0437   ALT 28 07/27/2016 0437   ALKPHOS 42 07/27/2016 0437   BILITOT 0.8 07/27/2016 0437   GFRNONAA >60 07/27/2016 0437   GFRAA >60 07/27/2016 0437   Lipase     Component Value Date/Time   LIPASE 5.0 (L) 07/24/2016 0934       Studies/Results: No results found.  Anti-infectives: Anti-infectives    Start     Dose/Rate Route Frequency Ordered Stop   07/25/16  0400  piperacillin-tazobactam (ZOSYN) IVPB 3.375 g     3.375 g 12.5 mL/hr over 240 Minutes Intravenous Every 8 hours 07/24/16 2302     07/24/16 1845  piperacillin-tazobactam (ZOSYN) IVPB 3.375 g     3.375 g 100 mL/hr over 30 Minutes Intravenous  Once 07/24/16 1837 07/24/16 2222     Assessment/Plan S/p laparoscopic cholecystectomy, placement 60F blake drain 07/25/16 Dr. Zella Richer Per op note: necrosis of GB/cystic duct, monitor labs/drain output closely for bile leak Drain OP increased from 145 cc to 280cc/24h, bilious WBC/LFT's WNL, VSS Pain controlled with NORCO   FEN: soft diet, NPO after MN for possible endoscopy  ID: Zosyn 11/3 >> VTE: SCD's   Dispo:  post-operative bile leak. GI consult for possible ERCP/stent placement   LOS: 3 days    Jill Alexanders , [facility] 07/30/2016, 7:55 AM Pager: [phone] Consults: [phone] Mon-Fri 7:00 am-4:30 pm Sat-Sun 7:00 am-11:30 am

## 2016-07-31 ENCOUNTER — Inpatient Hospital Stay (HOSPITAL_COMMUNITY): Payer: Medicare Other | Admitting: Certified Registered"

## 2016-07-31 ENCOUNTER — Telehealth: Payer: Self-pay | Admitting: Gastroenterology

## 2016-07-31 ENCOUNTER — Inpatient Hospital Stay (HOSPITAL_COMMUNITY): Payer: Medicare Other

## 2016-07-31 ENCOUNTER — Encounter (HOSPITAL_COMMUNITY): Payer: Self-pay

## 2016-07-31 ENCOUNTER — Encounter (HOSPITAL_COMMUNITY)
Admission: EM | Disposition: A | Discharge status: Other Acute Inpatient Hospital | Payer: Self-pay | Source: Home / Self Care

## 2016-07-31 DIAGNOSIS — Z538 Procedure and treatment not carried out for other reasons: Secondary | ICD-10-CM

## 2016-07-31 HISTORY — PX: ERCP: SHX5425

## 2016-07-31 SURGERY — ERCP, WITH INTERVENTION IF INDICATED
Anesthesia: General

## 2016-07-31 MED ORDER — ONDANSETRON HCL 4 MG/2ML IJ SOLN
INTRAMUSCULAR | Status: AC
  Filled 2016-07-31: qty 2

## 2016-07-31 MED ORDER — PHENYLEPHRINE 40 MCG/ML (10ML) SYRINGE FOR IV PUSH (FOR BLOOD PRESSURE SUPPORT)
PREFILLED_SYRINGE | INTRAVENOUS | Status: DC | PRN
  Administered 2016-07-31 (×2): 80 ug via INTRAVENOUS
  Administered 2016-07-31 (×2): 40 ug via INTRAVENOUS
  Administered 2016-07-31 (×2): 80 ug via INTRAVENOUS

## 2016-07-31 MED ORDER — SUCCINYLCHOLINE CHLORIDE 20 MG/ML IJ SOLN
INTRAMUSCULAR | Status: AC
  Filled 2016-07-31: qty 1

## 2016-07-31 MED ORDER — MENTHOL 3 MG MT LOZG: 1.0000 | LOZENGE | OROMUCOSAL | Status: DC | PRN

## 2016-07-31 MED ORDER — ONDANSETRON HCL 4 MG/2ML IJ SOLN
INTRAMUSCULAR | Status: DC | PRN
  Administered 2016-07-31: 4 mg via INTRAVENOUS

## 2016-07-31 MED ORDER — SODIUM CHLORIDE 0.9 % IV SOLN: INTRAVENOUS | Status: DC

## 2016-07-31 MED ORDER — GLUCAGON HCL RDNA (DIAGNOSTIC) 1 MG IJ SOLR
INTRAMUSCULAR | Status: DC | PRN
  Administered 2016-07-31: .5 mg via INTRAVENOUS

## 2016-07-31 MED ORDER — FENTANYL CITRATE (PF) 100 MCG/2ML IJ SOLN
INTRAMUSCULAR | Status: AC
  Filled 2016-07-31: qty 2

## 2016-07-31 MED ORDER — PHENOL 1.4 % MT LIQD
1.0000 | OROMUCOSAL | Status: DC | PRN
  Filled 2016-07-31: qty 177

## 2016-07-31 MED ORDER — FENTANYL CITRATE (PF) 100 MCG/2ML IJ SOLN
INTRAMUSCULAR | Status: DC | PRN
  Administered 2016-07-31: 100 ug via INTRAVENOUS

## 2016-07-31 MED ORDER — PROPOFOL 10 MG/ML IV BOLUS
INTRAVENOUS | Status: DC | PRN
  Administered 2016-07-31: 120 mg via INTRAVENOUS

## 2016-07-31 MED ORDER — PHENYLEPHRINE 40 MCG/ML (10ML) SYRINGE FOR IV PUSH (FOR BLOOD PRESSURE SUPPORT)
PREFILLED_SYRINGE | INTRAVENOUS | Status: AC
  Filled 2016-07-31: qty 10

## 2016-07-31 MED ORDER — LACTATED RINGERS IV SOLN
INTRAVENOUS | Status: DC
  Administered 2016-07-31: 1000 mL via INTRAVENOUS

## 2016-07-31 MED ORDER — GLUCAGON HCL RDNA (DIAGNOSTIC) 1 MG IJ SOLR
INTRAMUSCULAR | Status: AC
  Filled 2016-07-31: qty 1

## 2016-07-31 MED ORDER — PROPOFOL 10 MG/ML IV BOLUS
INTRAVENOUS | Status: AC
  Filled 2016-07-31: qty 20

## 2016-07-31 MED ORDER — INDOMETHACIN 50 MG RE SUPP
RECTAL | Status: AC
  Filled 2016-07-31: qty 2

## 2016-07-31 MED ORDER — LIDOCAINE 2% (20 MG/ML) 5 ML SYRINGE
INTRAMUSCULAR | Status: DC | PRN
  Administered 2016-07-31: 20 mg via INTRAVENOUS

## 2016-07-31 MED ORDER — SUCCINYLCHOLINE CHLORIDE 200 MG/10ML IV SOSY
PREFILLED_SYRINGE | INTRAVENOUS | Status: DC | PRN
  Administered 2016-07-31: 100 mg via INTRAVENOUS

## 2016-07-31 NOTE — Interval H&P Note (Signed)
History and Physical Interval Note:  07/31/2016 10:12 AM  Angela Barber  has presented today for surgery, with the diagnosis of Bile duct leak following complicated GB resection  The various methods of treatment have been discussed with the patient and family. After consideration of risks, benefits and other options for treatment, the patient has consented to  Procedure(s): ENDOSCOPIC RETROGRADE CHOLANGIOPANCREATOGRAPHY (ERCP) (N/A) as a surgical intervention .  The patient's history has been reviewed, patient examined, no change in status, stable for surgery.  I have reviewed the patient's chart and labs.  Questions were answered to the patient's satisfaction.     Milus Banister

## 2016-07-31 NOTE — Progress Notes (Signed)
Patient transported to [facility] via non emergency ambulance. Patient is alert and oriented x 3. Stable at the time of transfer.

## 2016-07-31 NOTE — Anesthesia Preprocedure Evaluation (Signed)
Anesthesia Evaluation  Patient identified by MRN, date of birth, ID band Patient awake    Reviewed: Allergy & Precautions, NPO status , Patient's Chart, lab work & pertinent test results  Airway Mallampati: II  TM Distance: >3 FB Neck ROM: Full  Mouth opening: Limited Mouth Opening  Dental   Pulmonary former smoker,    Pulmonary exam normal        Cardiovascular negative cardio ROS Normal cardiovascular exam     Neuro/Psych Anxiety Depression negative neurological ROS     GI/Hepatic negative GI ROS, Neg liver ROS,   Endo/Other  negative endocrine ROS  Renal/GU negative Renal ROS  negative genitourinary   Musculoskeletal negative musculoskeletal ROS (+)   Abdominal   Peds negative pediatric ROS (+)  Hematology negative hematology ROS (+)   Anesthesia Other Findings   Reproductive/Obstetrics negative OB ROS                             Anesthesia Physical  Anesthesia Plan  ASA: II  Anesthesia Plan: General   Post-op Pain Management:    Induction: Intravenous  Airway Management Planned: Oral ETT  Additional Equipment:   Intra-op Plan:   Post-operative Plan: Extubation in OR  Informed Consent: I have reviewed the patients History and Physical, chart, labs and discussed the procedure including the risks, benefits and alternatives for the proposed anesthesia with the patient or authorized representative who has indicated his/her understanding and acceptance.     Plan Discussed with: CRNA and Surgeon  Anesthesia Plan Comments:         Anesthesia Quick Evaluation

## 2016-07-31 NOTE — Transfer of Care (Signed)
Immediate Anesthesia Transfer of Care Note  Patient: Angela Barber  Procedure(s) Performed: Procedure(s): ENDOSCOPIC RETROGRADE CHOLANGIOPANCREATOGRAPHY (ERCP) (N/A)  Patient Location: PACU  Anesthesia Type:General  Level of Consciousness:  sedated, patient cooperative and responds to stimulation  Airway & Oxygen Therapy:Patient Spontanous Breathing and Patient connected to face mask oxgen  Post-op Assessment:  Report given to PACU RN and Post -op Vital signs reviewed and stable  Post vital signs:  Reviewed and stable  Last Vitals:  Vitals:   07/31/16 0933 07/31/16 1145  BP: 130/78   Pulse: 84   Resp: 18   Temp: 36.5 C [id number] C    Complications: No apparent anesthesia complications

## 2016-07-31 NOTE — Anesthesia Procedure Notes (Signed)
Procedure Name: Intubation Date/Time: 07/31/2016 10:39 AM Performed by: Lajuana Carry E Pre-anesthesia Checklist: Patient identified, Emergency Drugs available, Suction available and Patient being monitored Patient Re-evaluated:Patient Re-evaluated prior to inductionOxygen Delivery Method: Circle system utilized Preoxygenation: Pre-oxygenation with 100% oxygen Intubation Type: IV induction Ventilation: Mask ventilation without difficulty Laryngoscope Size: Miller and 2 Grade View: Grade II Tube type: Oral Number of attempts: 1 Airway Equipment and Method: Stylet Placement Confirmation: ETT inserted through vocal cords under direct vision,  positive ETCO2 and breath sounds checked- equal and bilateral Secured at: 19 cm Tube secured with: Tape Dental Injury: Teeth and Oropharynx as per pre-operative assessment

## 2016-07-31 NOTE — Telephone Encounter (Signed)
Dr Ardis Hughs did proc on pt this morning @ hosp. Daughter laura Freddie Apley wants to speak to Dr Ardis Hughs to see why he wasn't able to place stent.  Her number is [phone]

## 2016-07-31 NOTE — Op Note (Signed)
[facility] Patient Name: Angela Barber Procedure Date: 07/31/2016 MRN: [id number] Attending MD: Milus Banister , MD Date of Birth: [birth year] CSN: [id number] Age: 80 Admit Type: Inpatient Procedure:                ERCP Indications:              Suspected bile leak following complicated                            (necrotic) GB resection last week, overt bile                            draining from JP Providers:                Milus Banister, MD, Cleda Daub, RN, Carolynn Comment, RN, William Dalton, Technician Referring MD:              Medicines:                General Anesthesia Complications:            No immediate complications. Estimated blood loss:                            None Estimated Blood Loss:     Estimated blood loss: none. Procedure:                Pre-Anesthesia Assessment:                           - Prior to the procedure, a History and Physical                            was performed, and patient medications and                            allergies were reviewed. The patient's tolerance of                            previous anesthesia was also reviewed. The risks                            and benefits of the procedure and the sedation                            options and risks were discussed with the patient.                            All questions were answered, and informed consent                            was obtained. Prior Anticoagulants: The patient has                            taken no previous anticoagulant  or antiplatelet                            agents. ASA Grade Assessment: II - A patient with                            mild systemic disease. After reviewing the risks                            and benefits, the patient was deemed in                            satisfactory condition to undergo the procedure.                           After obtaining informed consent, the scope was                   passed under direct vision. Throughout the                            procedure, the patient's blood pressure, pulse, and                            oxygen saturations were monitored continuously. The                            was introduced through the mouth, The procedure was                            aborted. The scope was not inserted. bile ducts                            Medications were duodenum. The ERCP was aborted due                            to the difficulty of the procedure. Findings:      A scout film of the abdomen was obtained. Surgical clips, consistent       with a previous cholecystectomy, were seen in the area of the right       upper quadrant of the abdomen. The esophagus was successfully intubated       under direct vision. The scope was advanced to the region of the major       papilla in the descending duodenum without detailed examination of the       pharynx, larynx and associated structures, and upper GI tract. There was       a medium to large sized periampullary diverticulum and the ampullary       orifice was just barely visible on the inner lip of the diverticulum,       facing away from the duodenal lumen, duodenoscope. I tried to pull the       ampulla out from the diverticululm without success. I tried a "richocet"       maneuver against the back wall of the diverticulum also without success.       Eventually the procedure was  aborted. Impression:               - The ERCP uncessful due to anatomic distortion                            from periampullary diverticulum (the barely visible                            ampullary orifice faces the back wall of the                            diverticulum, away from the duodenal lumen) Moderate Sedation:      N/A- Per Anesthesia Care Recommendation:           - Return patient to hospital ward for ongoing care.                           - Resume previous diet.                           - Will  discuss transfer to area tertiary biliary                            center for another ERCP attempt. Procedure Code(s):        --- Professional ---                           [phone], 52, Endoscopic retrograde                            cholangiopancreatography (ERCP); diagnostic,                            including collection of specimen(s) by brushing or                            washing, when performed (separate procedure) Diagnosis Code(s):        --- Professional ---                           Z53.8, Procedure and treatment not carried out for                            other reasons CPT copyright 2016 [employer] Medical Association. All rights reserved. The codes documented in this report are preliminary and upon coder review may  be revised to meet current compliance requirements. Milus Banister, MD 07/31/2016 11:47:23 AM This report has been signed electronically. Number of Addenda: 0

## 2016-07-31 NOTE — Anesthesia Postprocedure Evaluation (Signed)
Anesthesia Post Note  Patient: Angela Barber  Procedure(s) Performed: Procedure(s) (LRB): ENDOSCOPIC RETROGRADE CHOLANGIOPANCREATOGRAPHY (ERCP) (N/A)  Patient location during evaluation: Endoscopy Anesthesia Type: General Level of consciousness: awake and alert Pain management: pain level controlled Vital Signs Assessment: post-procedure vital signs reviewed and stable Respiratory status: spontaneous breathing, nonlabored ventilation, respiratory function stable and patient connected to nasal cannula oxygen Cardiovascular status: blood pressure returned to baseline and stable Postop Assessment: no signs of nausea or vomiting Anesthetic complications: no    Last Vitals:  Vitals:   07/31/16 1228 07/31/16 1248  BP: 117/88 126/73  Pulse:  91  Resp:    Temp:  36.7 C    Last Pain:  Vitals:   07/31/16 1248  TempSrc: Oral  PainSc:                  Montez Hageman

## 2016-07-31 NOTE — Progress Notes (Signed)
[facility] Progress Note  6 Days Post-Op  Subjective: Experienced generalized, "tight" abdominal pain last night that was "debilitating" and worse with movement/inspiration. Improved after PO pain medication. Patient tolerating diet. No nausea/vomiting.   Drain: 195 cc/24h   Objective: Vital signs in last 24 hours: Temp:  [97.9 F (36.6 C)-98.1 F (36.7 C)] 98.1 F (36.7 C) (11/09 [id number]) Pulse Rate:  [79-92] 80 (11/09 0608) Resp:  [18] 18 (11/09 0608) BP: (125-136)/(80-94) 125/82 (11/09 0608) SpO2:  [95 %-100 %] 95 % (11/09 0608) Last BM Date: 07/29/16  Intake/Output from previous day: 11/08 0701 - 11/09 0700 In: 2685 [P.O.:1560; I.V.:975; IV Piggyback:150] Out: [id number] [Urine:4500; Drains:195] Intake/Output this shift: No intake/output data recorded.  PE: Gen: Alert, NAD, pleasant and cooperative Pulm: CTABL, no W/R/R Abd: Soft, appropriately tender, mild distention,+BS, incisions C/D/I, dressings removed, drain with dark, bile-tinged fluid.  BMET  Recent Labs  07/30/16 0813  NA 139  K 4.0  CL 103  CO2 26  GLUCOSE 131*  BUN 7  CREATININE 0.91  CALCIUM 9.1   CMP     Component Value Date/Time   NA 139 07/30/2016 0813   K 4.0 07/30/2016 0813   CL 103 07/30/2016 0813   CO2 26 07/30/2016 0813   GLUCOSE 131 (H) 07/30/2016 0813   BUN 7 07/30/2016 0813   CREATININE 0.91 07/30/2016 0813   CREATININE 0.85 05/05/2016 0813   CALCIUM 9.1 07/30/2016 0813   PROT 6.5 07/30/2016 0813   ALBUMIN 3.3 (L) 07/30/2016 0813   AST 31 07/30/2016 0813   ALT 43 07/30/2016 0813   ALKPHOS 49 07/30/2016 0813   BILITOT 1.0 07/30/2016 0813   GFRNONAA 59 (L) 07/30/2016 0813   GFRAA >60 07/30/2016 0813   Lipase     Component Value Date/Time   LIPASE 5.0 (L) 07/24/2016 0934       Studies/Results: No results found.  Anti-infectives: Anti-infectives    Start     Dose/Rate Route Frequency Ordered Stop   07/25/16 0400  piperacillin-tazobactam (ZOSYN) IVPB  3.375 g     3.375 g 12.5 mL/hr over 240 Minutes Intravenous Every 8 hours 07/24/16 2302     07/24/16 1845  piperacillin-tazobactam (ZOSYN) IVPB 3.375 g     3.375 g 100 mL/hr over 30 Minutes Intravenous  Once 07/24/16 1837 07/24/16 2222       Assessment/Plan S/p laparoscopic cholecystectomy, placement 28F blake drain 07/25/16 Dr. Zella Richer Per op note: necrosis of GB/cystic duct, monitor labs/drain output closely for bile leak  Developed a bile leak 07/30/16 WBC/LFT's WNL, VSS Pain controlled with NORCO   FEN: NPO ID: Zosyn 11/3 >> VTE: SCD's    Dispo: ERCP today at 10:00 AM with Dr. Ardis Hughs Pt request Dr. Ardis Hughs to call her daughter, Ms. Raybon, after the procedure today.   LOS: 4 days    [facility] 07/31/2016, 7:50 AM Pager: [phone] Consults: [phone] Mon-Fri 7:00 am-4:30 pm Sat-Sun 7:00 am-11:30 am

## 2016-07-31 NOTE — Telephone Encounter (Signed)
I spoke with her

## 2016-07-31 NOTE — Progress Notes (Signed)
I spoke with Dr. Saddie Benders GI endoscopist at [facility], they are happy to try ERCP for bile leak there.  She has been accepted for transfer via PAL line and that will hopefully happen tonight so they can to ERCP tomorrow.    CCsurgery team here is aware, I just informed Dr. Zella Richer.

## 2016-07-31 NOTE — Progress Notes (Signed)
Report called to Designer, jewellery at [employer] at [facility]. Pt is assigned to [place]

## 2016-07-31 NOTE — Discharge Summary (Signed)
[facility] Discharge Summary   Patient ID: Angela Barber MRN: [id number] DOB/AGE: [birth year] 78 y.o.  Admit date: 07/24/2016 Discharge date: 07/31/2016  Admitting Diagnosis: Acute acalculous cholecystitis   Discharge Diagnosis Patient Active Problem List   Diagnosis Date Noted  . Acute acalculous cholecystitis 07/24/2016  . Degenerative arthritis of right knee 06/04/2016  . Degenerative arthritis of left knee 04/10/2016  . Insomnia 03/20/2016  . Chest pain at rest   . Right knee pain 08/30/2015  . Symptomatic varicose veins of both lower extremities 07/25/2015  . Routine general medical examination at a health care facility 08/13/2014  . 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 Adjustment disorder with mixed anxiety and depressed mood 05/19/2013  . Paresthesia of lower extremity 01/18/2012  . 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 Hyperlipidemia 05/24/2009  . Hearing loss 04/25/2008  . CLOSTRIDIUM DIFFICILE COLITIS, HX OF 10/23/2007    Consultants 07/24/16 Gastroenterology  - Dr. Estill Cotta Danis 07/30/16 Gastroenterology - Dr. Milus Banister  Imaging: 07/24/16 CT ABD PELVIS W CONTRAST - Imaging findings are concerning for acute acalculous cholecystitis. There is gallbladder wall edema and enhancement with surrounding inflammatory changes including fat stranding and free fluid.  Procedures Dr. Jackolyn Confer (07/25/16) - Laparoscopic Cholecystectomy Dr. Milus Banister (07/31/16) - ERCP   Hospital Course:  80 year-old female who presented to [facility] ED with 4 days of right upper quadrant pain. White blood cell count was 19,000. CT scan as above.  Patient was admitted, started on IV antibiotics, and underwent procedure listed above. During the operation her gallbladder and cystic duct were found to be necrotic, with the cystic duct avulsing away from the gallbladder with minimal manipulation. A cholangiogram was unable to  be performed. The patient tolerated procedure well and was transferred to the floor.  Diet was advanced as tolerated, and her drain output was monitored closely.  On POD#5 the patient developed a post-operative bile leak, with increasing output in her surgical drain that was tinged with dark, bilious fluid. Gastroenterology was consulted and the patient was scheduled for an ERCP with Dr. Ardis Hughs. Unfortunately, the ERCP was unsuccessful due to anatomic distortion from periampullary diverticulum. The patient tolerated the ERCP well. The decision was made to transfer the patient to [facility], a tertiary biliary center, for another ERCP attempt by Dr. Saddie Benders, GI endoscopist.    Current Facility-Administered Medications:  .  acetaminophen (TYLENOL) tablet 650 mg, 650 mg, Oral, Q6H PRN, 650 mg at 07/30/16 1039 **OR** acetaminophen (TYLENOL) suppository 650 mg, 650 mg, Rectal, Q6H PRN, Armandina Gemma, MD .  dextrose 5 % and 0.45 % NaCl with KCl 20 mEq/L infusion, , Intravenous, Continuous, Leighton Ruff, MD, Last Rate: 75 mL/hr at 07/31/16 0210 .  HYDROcodone-acetaminophen (NORCO/VICODIN) 5-325 MG per tablet 1-2 tablet, 1-2 tablet, Oral, Q4H PRN, Armandina Gemma, MD, 2 tablet at 07/30/16 1919 .  HYDROmorphone (DILAUDID) injection 0.3-0.6 mg, 0.3-0.6 mg, Intravenous, Q2H PRN, Jackolyn Confer, MD, 0.6 mg at 07/31/16 0500 .  lactated ringers infusion, , Intravenous, Continuous, Milus Banister, MD, Last Rate: 50 mL/hr at 07/31/16 0937, 1,000 mL at 07/31/16 0937 .  menthol-cetylpyridinium (CEPACOL) lozenge 3 mg, 1 lozenge, Oral, PRN, Armandina Gemma, MD .  ondansetron (ZOFRAN-ODT) disintegrating tablet 4 mg, 4 mg, Oral, Q6H PRN **OR** ondansetron (ZOFRAN) injection 4 mg, 4 mg, Intravenous, Q6H PRN, Armandina Gemma, MD, 4 mg at 07/26/16 2207 .  phenol (CHLORASEPTIC) mouth spray 1 spray, 1 spray,  Mouth/Throat, PRN, Armandina Gemma, MD .  piperacillin-tazobactam (ZOSYN) IVPB 3.375 g, 3.375 g, Intravenous, Q8H, Armandina Gemma, MD, 3.375 g at 07/31/16 1324 .  polyethylene glycol (MIRALAX / GLYCOLAX) packet 17 g, 17 g, Oral, Daily, Monia Timmers S Bernhardt Riemenschneider, PA-C, 17 g at 07/30/16 1039     Medication List    TAKE these medications   acetaminophen 500 MG tablet Commonly known as:  TYLENOL Take 500 mg by mouth at bedtime as needed for mild pain (Pt takes in combination with Tylenol PM).   aspirin 81 MG tablet Take 81 mg by mouth daily.   bifidobacterium infantis capsule Take 1 capsule by mouth daily.   diclofenac sodium 1 % Gel Commonly known as:  VOLTAREN Apply 2 g topically 4 (four) times daily. To affected joint. What changed:  when to take this  reasons to take this  additional instructions   diphenhydramine-acetaminophen 25-500 MG Tabs tablet Commonly known as:  TYLENOL PM Take 1 tablet by mouth at bedtime as needed (sleep). Pt takes in combination with 1 tablet of 500mg  tylenol at night as needed   nitroGLYCERIN 0.4 MG SL tablet Commonly known as:  NITROSTAT Place 1 tablet (0.4 mg total) under the tongue every 5 (five) minutes as needed for chest pain.   ondansetron 4 MG tablet Commonly known as:  ZOFRAN Take 1 tablet (4 mg total) by mouth every 6 (six) hours as needed for nausea or vomiting.   pantoprazole 20 MG tablet Commonly known as:  PROTONIX Take 1 tablet (20 mg total) by mouth daily. What changed:  how much to take  when to take this   traMADol 50 MG tablet Commonly known as:  ULTRAM Take 1 tablet (50 mg total) by mouth every 6 (six) hours as needed.       Follow-up Information    ROSENBOWER,TODD J, MD. Schedule an appointment as soon as possible for a visit in 2 week(s).   Specialty:  General Surgery Why:  for post-operative follow-up and possible drain removal. Contact information: [address] [place] [address] [phone]        [facility] Follow up.   Why:  nurse for drain care Contact information: [address] [place] [address] [phone]          Signed: Obie Dredge, [facility] 07/31/2016, 2:25 PM Pager: [phone] Consults: [phone] Mon-Fri 7:00 am-4:30 pm Sat-Sun 7:00 am-11:30 am

## 2016-07-31 NOTE — H&P (View-Only) (Signed)
Re-Consultation  Referring Provider: Obie Dredge, PA-C Primary Care Physician:  Hoyt Koch, MD Primary Gastroenterologist: Dr. Olevia Perches         Reason for Consultation:  Bile leak after Cholecystectomy last week            HPI:   Angela Barber is a 80 y.o. Caucasian female with a past medical history of anxiety, depression, diverticulosis, hyperlipidemia and osteoarthritis who we were initially consulted on regarding right upper quadrant pain on 07/24/16. Please see consult note from Dr. Loletha Carrow at that time. Briefly, the patient had been seen by Dr. Havery Moros earlier that day in the office with a constant right upper quadrant pain which was nonradiating associated with nausea and vomiting, requiring pain medicine every 4-6 hours. She was found to be very tender on exam and was sent for a stat outpatient abdominal CT. This showed an acalculous cholecystitis and CBD measured 9 mm with no intrahepatic biliary ductal dilation and she was referred to the ED. Surgery was called and recommended a HIDA scan. She was seen by surgery on Monday and plan was for a cholecystectomy on 07/25/2016. Findings during this procedure were an inflamed and necrotic gallbladder as well as cystic duct. Percutaneous drain was placed. There was concern for bile leak on 07/27/2016 due to the tenuous cystic duct. This morning the drain output increased from 145 mL to 280 mL over the past 24 hours. She was diagnosed with a bile leak and we were re-consulted today.   Patient tells me that she was tolerating a soft diet, but continues with right upper quadrant pain which is only helped with pain medications. She did have a bowel movement yesterday with the help of the stool softener. She is aware of plans for ERCP but does ask me multiple questions regarding this procedure.   Patient denies fever, chills, blood in her stool, melena, nausea, vomiting or change in abdominal pain.  Past Medical History:  Diagnosis  Date  . Anxiety   . C. difficile diarrhea 9/06  . Cochlear implant status   . Depression   . Diverticulosis   . Hearing loss of both ears   . HLD (hyperlipidemia)   . Hyperplastic colon polyp   . Insomnia   . Internal hemorrhoid   . Osteoarthritis, knee     Past Surgical History:  Procedure Laterality Date  . BUNIONECTOMY     right  . CHOLECYSTECTOMY N/A 07/25/2016   Procedure: LAPAROSCOPIC CHOLECYSTECTOMY;  Surgeon: Jackolyn Confer, MD;  Location: [facility]S;  Service: General;  Laterality: N/A;  Royann Shivers IMPLANT  July '13   right ear. Dr. Idelle Crouch  . ROTATOR CUFF REPAIR Left 2012  . ROTATOR CUFF REPAIR Right 2007 & 2009  . TONSILLECTOMY    . VAGINAL HYSTERECTOMY      Family History  Problem Relation Age of Onset  . Ovarian cancer Mother   . Transient ischemic attack Mother   . Breast cancer Maternal Aunt     aunts  . Stroke Sister   . Anxiety disorder Daughter     Social History  Substance Use Topics  . Smoking status: Former Research scientist (life sciences)  . Smokeless tobacco: Never Used  . Alcohol use Yes     Comment: 1 glass of wine a day    Prior to Admission medications   Medication Sig Start Date End Date Taking? Authorizing Provider  acetaminophen (TYLENOL) 500 MG tablet Take 500 mg by mouth at bedtime as needed for mild  pain (Pt takes in combination with Tylenol PM).    Yes Historical Provider, MD  aspirin 81 MG tablet Take 81 mg by mouth daily.   Yes Historical Provider, MD  bifidobacterium infantis (ALIGN) capsule Take 1 capsule by mouth daily.   Yes Historical Provider, MD  diclofenac sodium (VOLTAREN) 1 % GEL Apply 2 g topically 4 (four) times daily. To affected joint. Patient taking differently: Apply 2 g topically 2 (two) times daily as needed (pain). To affected joint. 05/16/16  Yes Lyndal Pulley, DO  diphenhydramine-acetaminophen (TYLENOL PM) 25-500 MG TABS tablet Take 1 tablet by mouth at bedtime as needed (sleep). Pt takes in combination with 1 tablet of 500mg  tylenol  at night as needed   Yes Historical Provider, MD  nitroGLYCERIN (NITROSTAT) 0.4 MG SL tablet Place 1 tablet (0.4 mg total) under the tongue every 5 (five) minutes as needed for chest pain. 03/20/16  Yes Thurnell Lose, MD  ondansetron (ZOFRAN) 4 MG tablet Take 1 tablet (4 mg total) by mouth every 6 (six) hours as needed for nausea or vomiting. 07/22/16  Yes Julianne Rice, MD  pantoprazole (PROTONIX) 20 MG tablet Take 1 tablet (20 mg total) by mouth daily. Patient taking differently: Take 40 mg by mouth 2 (two) times daily.  07/22/16  Yes Julianne Rice, MD  traMADol (ULTRAM) 50 MG tablet Take 1 tablet (50 mg total) by mouth every 6 (six) hours as needed. 07/22/16  Yes Julianne Rice, MD    Current Facility-Administered Medications  Medication Dose Route Frequency Provider Last Rate Last Dose  . acetaminophen (TYLENOL) tablet 650 mg  650 mg Oral Q6H PRN Armandina Gemma, MD       Or  . acetaminophen (TYLENOL) suppository 650 mg  650 mg Rectal Q6H PRN Armandina Gemma, MD      . dextrose 5 % and 0.45 % NaCl with KCl 20 mEq/L infusion   Intravenous Continuous Leighton Ruff, MD 75 mL/hr at 07/29/16 2312    . HYDROcodone-acetaminophen (NORCO/VICODIN) 5-325 MG per tablet 1-2 tablet  1-2 tablet Oral Q4H PRN Armandina Gemma, MD   2 tablet at 07/29/16 2042  . HYDROmorphone (DILAUDID) injection 0.3-0.6 mg  0.3-0.6 mg Intravenous Q2H PRN Jackolyn Confer, MD   0.3 mg at 07/26/16 2234  . ondansetron (ZOFRAN-ODT) disintegrating tablet 4 mg  4 mg Oral Q6H PRN Armandina Gemma, MD       Or  . ondansetron [facility]) injection 4 mg  4 mg Intravenous Q6H PRN Armandina Gemma, MD   4 mg at 07/26/16 2207  . piperacillin-tazobactam (ZOSYN) IVPB 3.375 g  3.375 g Intravenous Q8H Armandina Gemma, MD   3.375 g at 07/30/16 0342  . polyethylene glycol (MIRALAX / GLYCOLAX) packet 17 g  17 g Oral Daily Jill Alexanders, PA-C   17 g at 07/29/16 0850    Allergies as of 07/24/2016  . (No Known Allergies)     Review of Systems:      Constitutional: No weight loss, fever, chills, weakness or fatigue HEENT: Eyes: No change in vision               Ears, Nose, Throat:  No change in hearing or congestion Skin: No rash or itching Cardiovascular: No chest pain, chest pressure or palpitations   Respiratory: No SOB or cough Gastrointestinal: See HPI and otherwise negative Genitourinary: No dysuria or change in urinary frequency Neurological: No headache, dizziness or syncope Musculoskeletal: No new muscle or joint pain Hematologic: No bleeding or bruising Psychiatric: No  history of depression or anxiety   Physical Exam:  Vital signs in last 24 hours: Temp:  [97.5 F (36.4 C)-98.7 F (37.1 C)] 98.1 F (36.7 C) (11/08 0538) Pulse Rate:  [84-110] 84 (11/08 0538) Resp:  [16-18] 18 (11/08 0538) BP: (118-137)/(60-85) 137/72 (11/08 0538) SpO2:  [97 %-100 %] 98 % (11/08 0538) Last BM Date: 07/29/16 General:   Pleasant elderly Caucasian female appears to be in NAD, Well developed, Well nourished, alert and cooperative Head:  Normocephalic and atraumatic. Eyes:   PEERL, EOMI. No icterus. Conjunctiva pink. Ears:  Normal auditory acuity. Neck:  Supple Throat: Oral cavity and pharynx without inflammation, swelling or lesion.  Lungs: Respirations even and unlabored. Lungs clear to auscultation bilaterally.   No wheezes, crackles, or rhonchi.  Heart: Normal S1, S2. No MRG. Regular rate and rhythm. No peripheral edema, cyanosis or pallor.  Abdomen:  Soft, nondistended, Tender in RUQ and around surgical scars, drain present in RUQ with dark, bile tinged fluid. No rebound or guarding. Normal bowel sounds. No appreciable masses or hepatomegaly. Rectal:  Not performed.  Msk:  Symmetrical without gross deformities. Peripheral pulses intact.  Extremities:  Without edema, no deformity or joint abnormality.  Neurologic:  Alert and  oriented x4;  grossly normal neurologically.  Skin:   Dry and intact without significant lesions or  rashes. Psychiatric: Oriented to person, place and time. Demonstrates good judgement and reason without abnormal affect or behaviors.    Impression / Plan:   Impression: 1. Status post laparoscopic cholecystectomy on 07/25/16, placement of 19 [place] Blake drain-patient was found to have necrotic gallbladder and cystic duct at time of operation, drain was placed and bile output recently increased from 145-280 mL overnight, concern for bile leak  Plan: 1. Continue supportive measures. 2. Plan for ERCP tomorrow with Dr. Ardis Hughs for stent placement. Discussed risks, benefits, limitations and alternatives and the patient agrees to proceed.  3.Patient may continue diet today but will be nothing by mouth after midnight 4. Will discuss above with Dr. Ardis Hughs, please await any further recommendations  Thank you for your kind consultation, we will continue to follow.  Lavone Nian [facility]  07/30/2016, 8:46 AM Pager #: [phone]   ________________________________________________________________________  Velora Heckler GI MD note:  I personally examined the patient, reviewed the data and agree with the assessment and plan described above.  Obvious controlled bile leak (green thin bile in JP drain following complicated cholecystectomy).  I discussed risks (specifically pancreatitis, perforation, bleeding as well as other) to ERCP with her and her husband. Planning to proceed tomorrow AM (currently on schedule for 10am).  Owens Loffler, MD [facility] Gastroenterology Pager [phone]

## 2016-08-01 ENCOUNTER — Encounter (HOSPITAL_COMMUNITY): Payer: Self-pay | Admitting: Gastroenterology

## 2016-08-03 ENCOUNTER — Telehealth: Payer: Self-pay | Admitting: Gastroenterology

## 2016-08-03 NOTE — Telephone Encounter (Signed)
Dr. Delrae Alfred from [facility] called.  He was able to complete ERCP last week (sphincterotomy and stented the bile duct). Says he saw leaking from the cystic duct.  Asked that I removed the stent in about 6 weeks.  Patty, She needs ERCP in 6-8 weeks at [place] on a Thursday MAC day.  To remove the stent.    Todd, I presume you'll have pulled the drain before 6-8 weeks.  If you have any problems with that, think she is still leaking before my ERCP in 6-8 weeks please let me know.  Thanks

## 2016-08-04 NOTE — Telephone Encounter (Signed)
The pt will need to have ERCP the first of the year.  Will call and set up when closer to appt time and get updated insurance info.

## 2016-08-05 ENCOUNTER — Ambulatory Visit: Payer: Medicare Other | Admitting: Cardiovascular Disease

## 2016-08-05 ENCOUNTER — Telehealth: Payer: Self-pay | Admitting: Internal Medicine

## 2016-08-05 NOTE — Telephone Encounter (Signed)
Reporting abnormal vital signs: Sitting BP - 110/60 HR of 94 irregular  (patient states this is not normal) Standing BP - 70/58  HR 112 irregular Temp 98.9 orally  Respiratory 18 Sat 94% States patient was not drinking much.  Did instruct patient to drink plenty of fluid.   States patient does have family member with her and instructed family member what to signs to look out for and to call with any changes. States patient has another nurse visit on Thursday.

## 2016-08-05 NOTE — Telephone Encounter (Signed)
Continue to monitor and if still irregular would recommend visit with EKG

## 2016-08-06 NOTE — Telephone Encounter (Signed)
Home health can check vitals but needs visit at office if still irregular.

## 2016-08-06 NOTE — Telephone Encounter (Signed)
Tried to call Glenard Haring and there was no answer or VM.  Called patient with MD response.  Patient would rather have Home health monitor this.  Can you please send a fax over with MD response.  Patient states home health is suppose to come visit again tomorrow 11/16.

## 2016-08-06 NOTE — Telephone Encounter (Signed)
Spoke to Lincoln and she will be going out to the patient's house tomorrow. She will check the patient's vitals and if any abnormalities are found, she will call us. She will also inform patient that she may need an office visit.

## 2016-08-08 ENCOUNTER — Ambulatory Visit (INDEPENDENT_AMBULATORY_CARE_PROVIDER_SITE_OTHER): Payer: Medicare Other | Admitting: Internal Medicine

## 2016-08-08 ENCOUNTER — Ambulatory Visit: Payer: Self-pay | Admitting: Internal Medicine

## 2016-08-08 ENCOUNTER — Encounter: Payer: Self-pay | Admitting: Internal Medicine

## 2016-08-08 VITALS — BP 120/60 | HR 80 | Temp 98.6°F | Resp 14 | Ht 63.0 in | Wt 145.8 lb

## 2016-08-08 DIAGNOSIS — I499 Cardiac arrhythmia, unspecified: Secondary | ICD-10-CM

## 2016-08-08 DIAGNOSIS — K810 Acute cholecystitis: Secondary | ICD-10-CM

## 2016-08-08 DIAGNOSIS — F4323 Adjustment disorder with mixed anxiety and depressed mood: Secondary | ICD-10-CM

## 2016-08-08 DIAGNOSIS — R002 Palpitations: Secondary | ICD-10-CM | POA: Diagnosis not present

## 2016-08-08 NOTE — Progress Notes (Signed)
Pre visit review using our clinic review tool, if applicable. No additional management support is needed unless otherwise documented below in the visit note. 

## 2016-08-08 NOTE — Patient Instructions (Signed)
We do not need any blood work today.   I am glad you are doing better so keep working on recovering.

## 2016-08-09 ENCOUNTER — Encounter: Payer: Self-pay | Admitting: Internal Medicine

## 2016-08-09 DIAGNOSIS — I499 Cardiac arrhythmia, unspecified: Secondary | ICD-10-CM | POA: Insufficient documentation

## 2016-08-09 NOTE — Progress Notes (Signed)
   Subjective:    Patient ID: Angela Barber, female    DOB: 03-26-1936, 80 y.o.   MRN: [id number]  HPI The patient is a 80 YO female coming in for hospital follow up (in for complicated cholecystitis, with transfer for ERCP after failure at hospital here, with JP drain still making about 4 cc per day and decreasing about 1 cc per day). She has been recovering slowly and making gradual progress. She is here also because her home health nurse has been checking her vitals when she changes the drain and felt her HR was irregular. She was asked to come get this checked. She is following with GI and surgery for recovery and has had her stitches out. No fevers or chills. Some abdominal soreness but not the RUQ pain she had before. Moving her bowels normally. Appetite is not full but she is eating and drinking okay.   PMH, [facility], social history reviewed and updated.   Review of Systems  Constitutional: Positive for activity change and appetite change. Negative for chills, fatigue, fever and unexpected weight change.  HENT: Negative.   Eyes: Negative.   Respiratory: Negative.   Cardiovascular: Negative.   Gastrointestinal: Positive for abdominal pain. Negative for abdominal distention, constipation, diarrhea, nausea and vomiting.  Musculoskeletal: Negative.   Skin: Negative.   Neurological: Negative.   Psychiatric/Behavioral: Negative.       Objective:   Physical Exam  Constitutional: She is oriented to person, place, and time. She appears well-developed and well-nourished.  HENT:  Head: Normocephalic and atraumatic.  Eyes: EOM are normal.  Neck: Normal range of motion.  Cardiovascular: Normal rate and regular rhythm.   Pulmonary/Chest: Breath sounds normal. No respiratory distress. She has no wheezes. She has no rales.  Abdominal: Soft. Bowel sounds are normal. She exhibits no distension and no mass. There is tenderness. There is no rebound and no guarding.  Mild tenderness diffuse, no rebound  or guarding. JP drain with about 2 cc bilious fluid.   Musculoskeletal: She exhibits no edema.  Neurological: She is alert and oriented to person, place, and time. Coordination normal.  Skin: Skin is warm and dry.  Psychiatric: She has a normal mood and affect.   Vitals:   08/08/16 1551  BP: 120/60  Pulse: 80  Resp: 14  Temp: 98.6 F (37 C)  SpO2: 97%  Weight: 145 lb 12.8 oz (66.1 kg)  Height: 5\' 3"  (1.6 m)   EKG: rate 102, sinus tachy, intervals normal, axis normal, no st or t wave changes.     Assessment & Plan:

## 2016-08-09 NOTE — Assessment & Plan Note (Signed)
Still with JP drain and pain is improving and drainage decreasing. She will need follow up ERCP which is already scheduled.

## 2016-08-09 NOTE — Assessment & Plan Note (Signed)
Doing well through this episode although she is a little down from the significant recovery time ahead of her.

## 2016-08-09 NOTE — Assessment & Plan Note (Signed)
She is in sinus on exam and not having palpitations or symptoms. No Afib while in the hospital. She does have high HR going back many years.

## 2016-09-16 ENCOUNTER — Telehealth: Payer: Self-pay

## 2016-09-16 NOTE — Telephone Encounter (Signed)
-----   Message from Barron Alvine, RN sent at 08/04/2016 10:27 AM EST ----- Dr. Delrae Alfred from [facility] called.  He was able to complete ERCP last week (sphincterotomy and stented the bile duct). Says he saw leaking from the cystic duct.  Asked that I removed the stent in about 6 weeks.  Angela Barber, She needs ERCP in 6-8 weeks at [place] on a Thursday MAC day.  To remove the stent.    Todd, I presume you'll have pulled the drain before 6-8 weeks.  If you have any problems with that, think she is still leaking before my ERCP in 6-8 weeks please let me know.  Thanks

## 2016-09-17 NOTE — Telephone Encounter (Signed)
Left message on machine to call back  

## 2016-09-18 NOTE — Telephone Encounter (Signed)
Left message on machine to call back  

## 2016-10-01 NOTE — Telephone Encounter (Signed)
Per patient, Dr Delrae Alfred removed the stent on 09/23/16. No need for ERCP with [place].

## 2017-04-03 IMAGING — US US ABDOMEN COMPLETE
1 series · 14 of 25 positions shown · non-contrast
Comparison: None.

CLINICAL DATA: Abdominal pain.

EXAM:
ULTRASOUND ABDOMEN COMPLETE

[Series 1: us abdomen complete · 0.17mm/px · 14 of 111 slices shown]
[im 1/111]
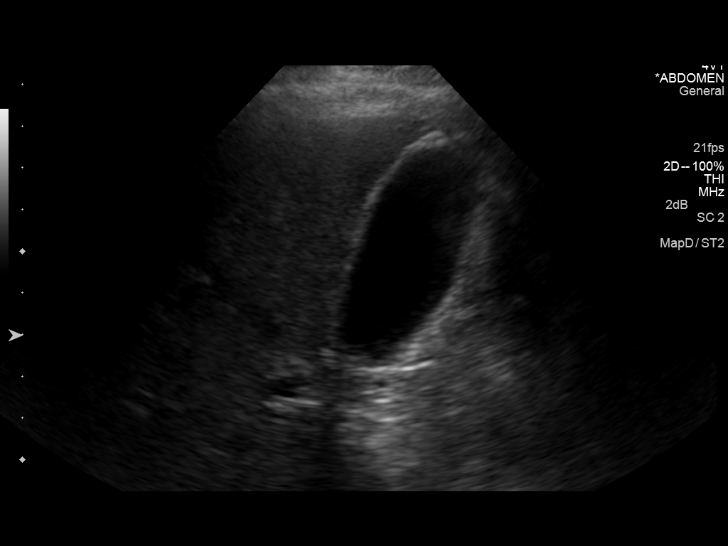
[im 10/111]
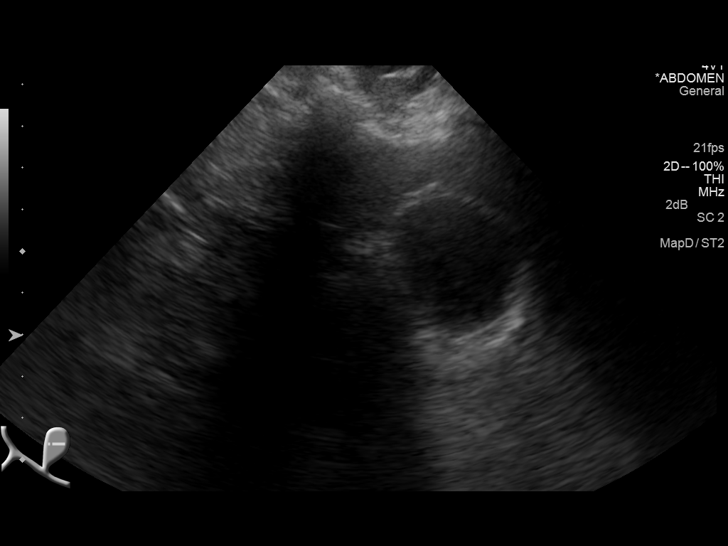
[im 19/111]
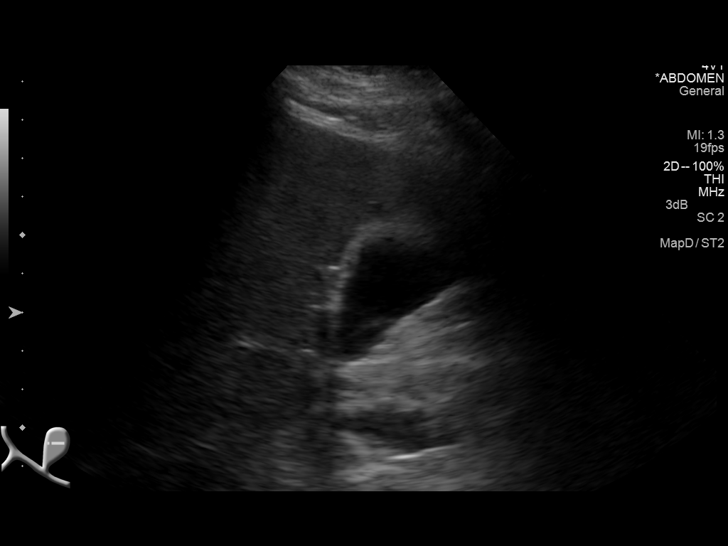
[im 28/111]
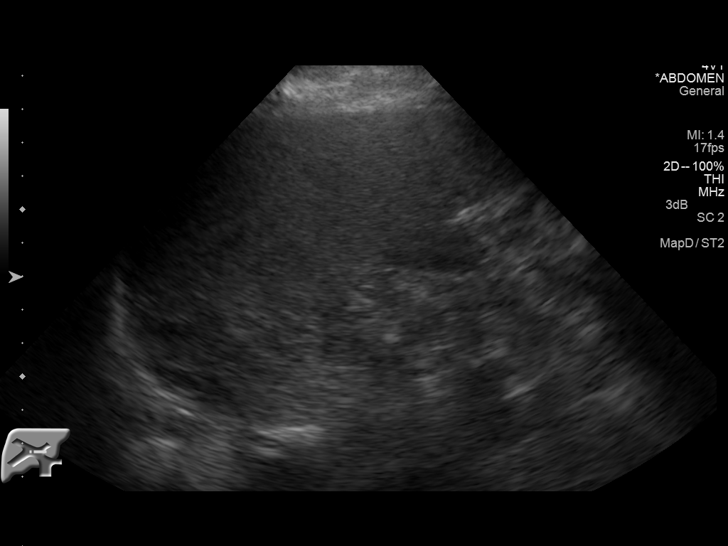
[im 37/111]
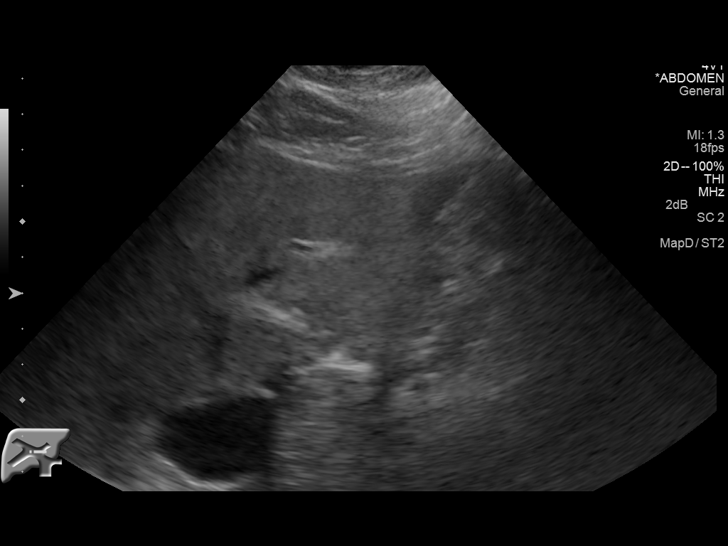
[im 42/111]
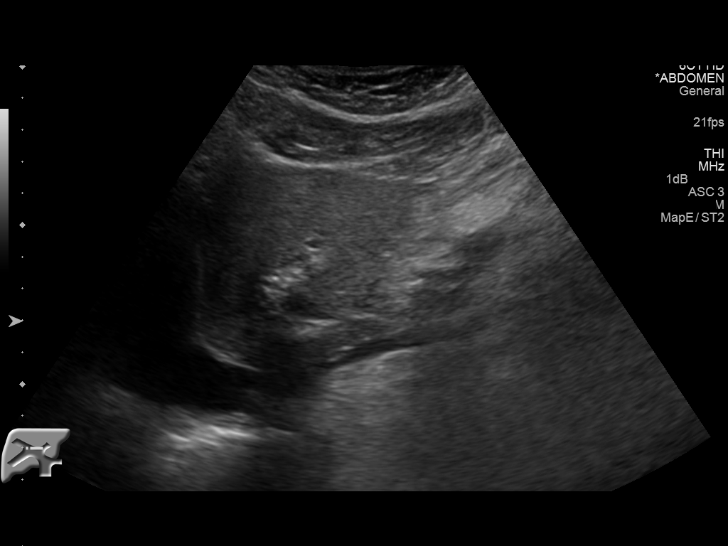
[im 51/111]
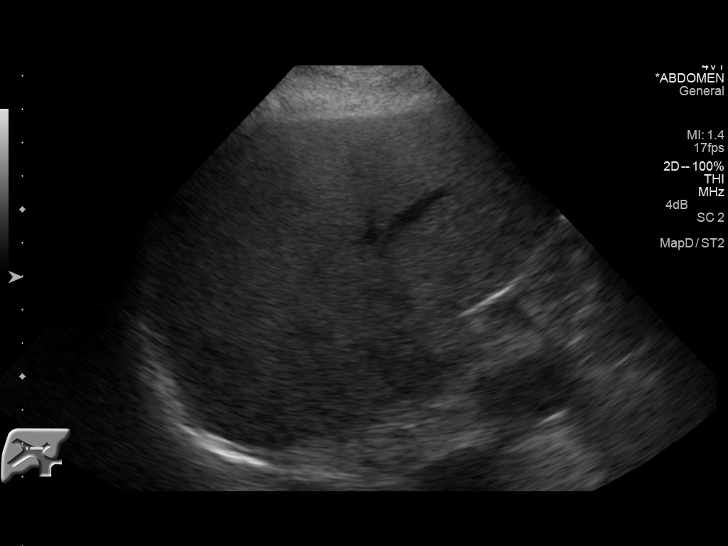
[im 60/111]
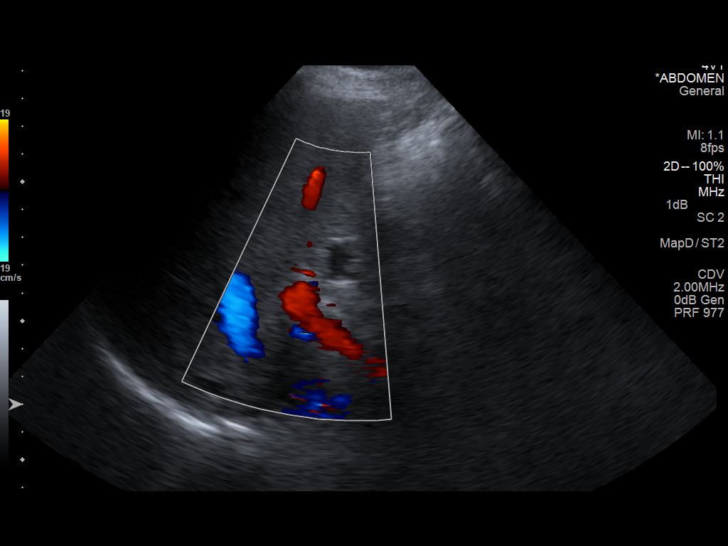
[im 69/111]
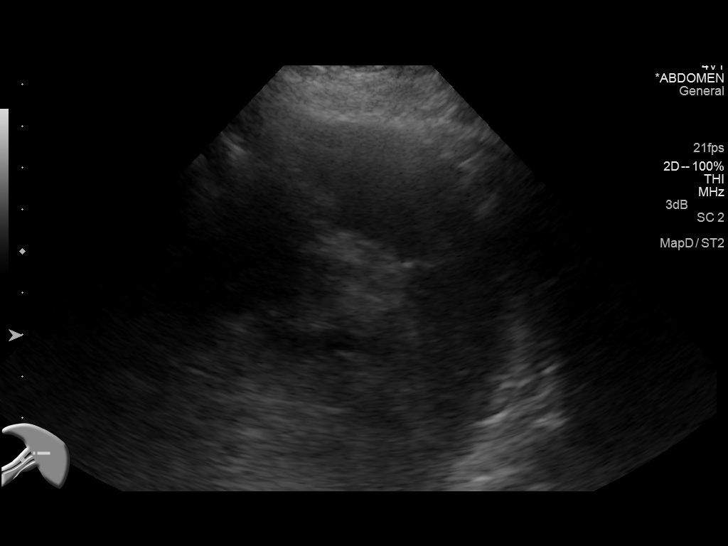
[im 74/111]
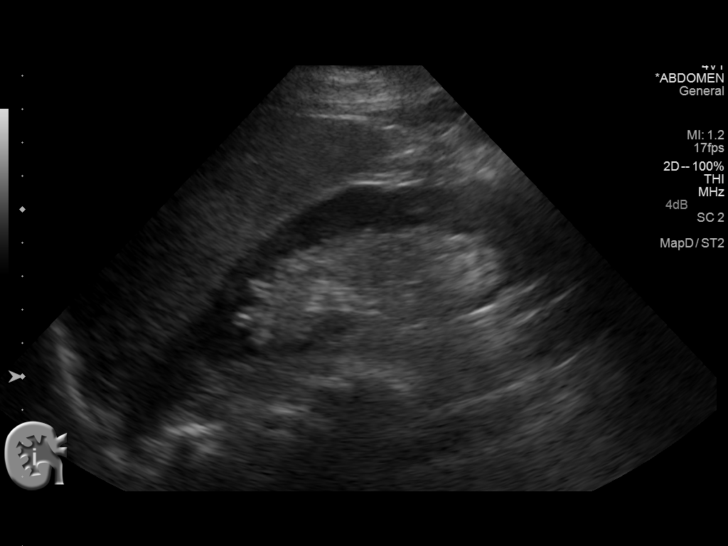
[im 83/111]
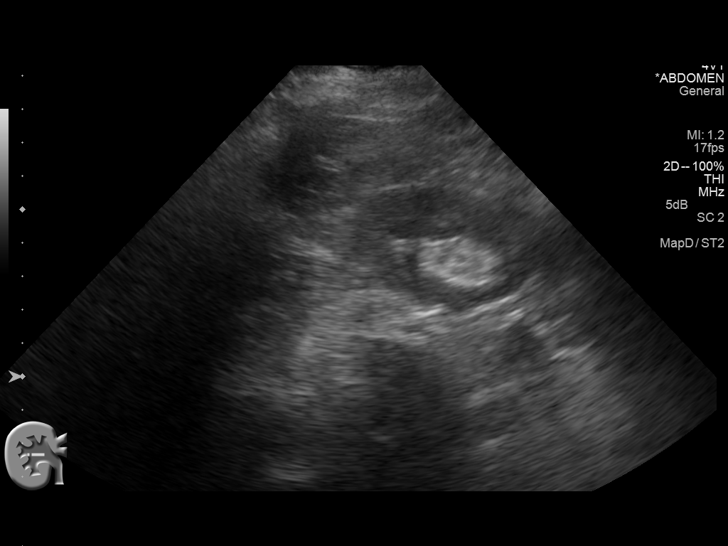
[im 92/111]
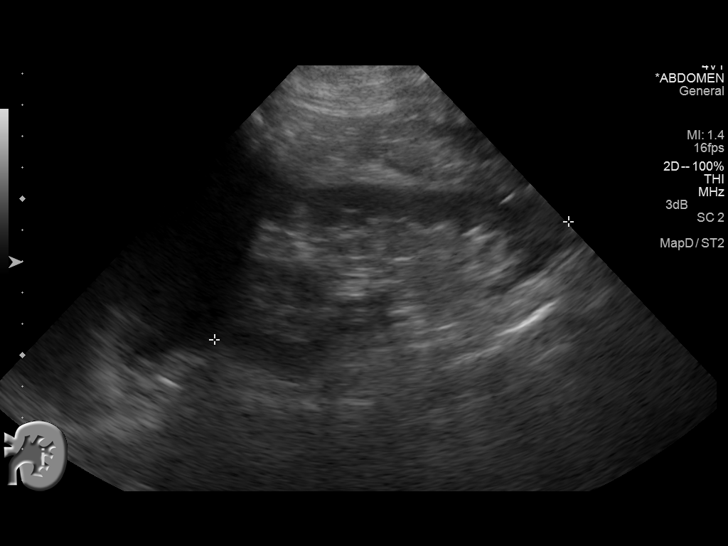
[im 101/111]
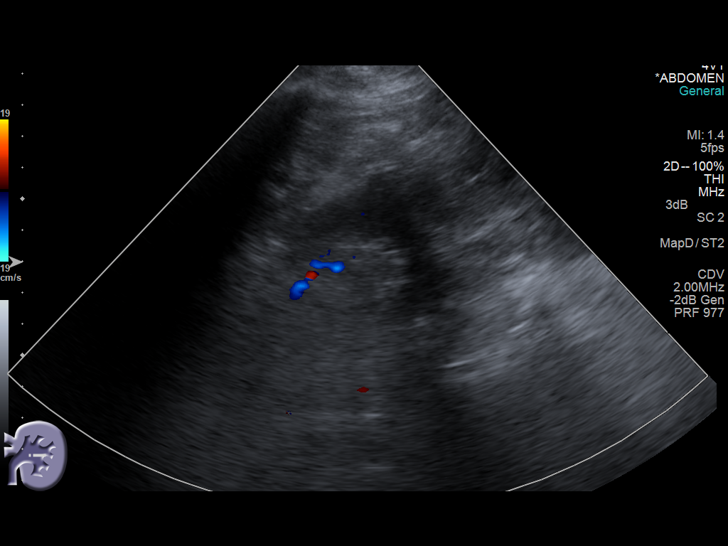
[im 111/111]
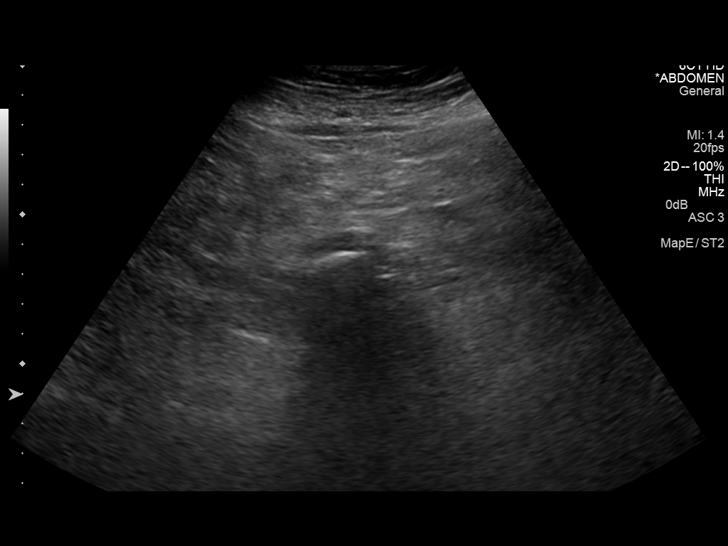

[14 of 25 positions shown; findings below may reference images not displayed]

FINDINGS: Gallbladder: No gallstones or wall thickening visualized. No
sonographic Murphy sign noted.

Common bile duct: Diameter: 2.4 mm

Liver: No focal lesion identified. Within normal limits in
parenchymal echogenicity.

IVC: No abnormality visualized.

Pancreas: Visualized portion unremarkable.

Spleen: Size and appearance within normal limits.

Right Kidney: Length: 11.1 cm. Echogenicity within normal limits. No
mass or hydronephrosis visualized.

Left Kidney: Length: 11.9 cm. Echogenicity within normal limits. No
mass or hydronephrosis visualized. 9.7 x 7.8 cm simple cyst.

Abdominal aorta: No aneurysm visualized.

Other findings: None.
IMPRESSION: No acute abnormality.  9.7 cm simple cyst left kidney.

## 2017-10-06 ENCOUNTER — Encounter: Payer: Self-pay | Admitting: Gastroenterology

## 2017-10-06 ENCOUNTER — Ambulatory Visit (INDEPENDENT_AMBULATORY_CARE_PROVIDER_SITE_OTHER): Payer: Medicare Other | Admitting: *Deleted

## 2017-10-06 ENCOUNTER — Ambulatory Visit (INDEPENDENT_AMBULATORY_CARE_PROVIDER_SITE_OTHER): Payer: Medicare Other | Admitting: Gastroenterology

## 2017-10-06 ENCOUNTER — Other Ambulatory Visit: Payer: Self-pay

## 2017-10-06 ENCOUNTER — Other Ambulatory Visit: Payer: Medicare Other

## 2017-10-06 VITALS — BP 120/80 | HR 84 | Ht 63.0 in | Wt 143.0 lb

## 2017-10-06 DIAGNOSIS — Z1211 Encounter for screening for malignant neoplasm of colon: Secondary | ICD-10-CM | POA: Diagnosis not present

## 2017-10-06 DIAGNOSIS — R195 Other fecal abnormalities: Secondary | ICD-10-CM

## 2017-10-06 DIAGNOSIS — Z23 Encounter for immunization: Secondary | ICD-10-CM | POA: Diagnosis not present

## 2017-10-06 MED ORDER — PEG 3350-KCL-NA BICARB-NACL 420 G PO SOLR: 4000.0000 mL | Freq: Once | ORAL | 0 refills | Status: DC

## 2017-10-06 MED ORDER — CHOLESTYRAMINE 4 G PO PACK: 4.0000 g | PACK | Freq: Every day | ORAL | 6 refills | Status: DC

## 2017-10-06 MED ORDER — PEG 3350-KCL-NA BICARB-NACL 420 G PO SOLR: 4000.0000 mL | Freq: Once | ORAL | 0 refills | Status: AC

## 2017-10-06 NOTE — Progress Notes (Signed)
Review of pertinent gastrointestinal problems: 1. Severe acute cholecystitis; necrotic GB 07/2016; c/b bile leak, ERCP Dr. Ardis Hughs 07/2016 unsuccessful due to large periampullary diverticulum.  She was transferred to [facility] and Dr. Delrae Alfred was able to successfully place the biliary stent to treat the bile leak.  He removed the stent several weeks later. 2. She had hyperplastic polyps removed 2003 DR. Brodie, repeat colonoscopy 2008 found diverticulosis dr. Olevia Perches.  HPI: This is a very pleasant 82 year old woman who was referred to me by Hoyt Koch, *  to evaluate chronic loose stools.    Chief complaint is chronic loose stools  I actually met Ms. Sotto about a year and a half ago at the time of severe acute cholecystitis.  See those results above.  She is here today for a different problem.  Bowels vary but generally she has loose stools. Can have 6 loose stools in a day.  Always will have at least 2 in a day.  She never has solid stools.    Seems like it is has gotten worse since her GB was removed. Has to be very careful about what she eats, where and when.  Certain dairy products aggrivate her bowels.   Overall her weight is generally up in the past year or so.  Has cut back caffeine.  No FH of colon cancer.    Old Data Reviewed:     Review of systems: Pertinent positive and negative review of systems were noted in the above HPI section. All other review negative.   Past Medical History:  Diagnosis Date  . Anxiety   . C. difficile diarrhea 9/06  . Cochlear implant status   . Depression   . Diverticulosis   . Hearing loss of both ears   . HLD (hyperlipidemia)   . Hyperplastic colon polyp   . Insomnia   . Internal hemorrhoid   . Osteoarthritis, knee     Past Surgical History:  Procedure Laterality Date  . BUNIONECTOMY     right  . CHOLECYSTECTOMY N/A 07/25/2016   Procedure: LAPAROSCOPIC CHOLECYSTECTOMY;  Surgeon: Jackolyn Confer, MD;  Location: [facility]  [facility];  Service: General;  Laterality: N/A;  Royann Shivers IMPLANT  July '13   right ear. Dr. Idelle Crouch  . ERCP N/A 07/31/2016   Procedure: ENDOSCOPIC RETROGRADE CHOLANGIOPANCREATOGRAPHY (ERCP);  Surgeon: Milus Banister, MD;  Location: [facility];  Service: Endoscopy;  Laterality: N/A;  . ROTATOR CUFF REPAIR Left 2012  . ROTATOR CUFF REPAIR Right 2007 & 2009  . TONSILLECTOMY    . VAGINAL HYSTERECTOMY      Current Outpatient Medications  Medication Sig Dispense Refill  . acetaminophen (TYLENOL) 500 MG tablet Take 500 mg by mouth at bedtime as needed for mild pain (Pt takes in combination with Tylenol PM).     . bifidobacterium infantis (ALIGN) capsule Take 1 capsule by mouth daily.    . diphenhydramine-acetaminophen (TYLENOL PM) 25-500 MG TABS tablet Take 1 tablet by mouth at bedtime as needed (sleep). Pt takes in combination with 1 tablet of 593m tylenol at night as needed     No current facility-administered medications for this visit.     Allergies as of 10/06/2017  . (No Known Allergies)    Family History  Problem Relation Age of Onset  . Ovarian cancer Mother   . Transient ischemic attack Mother   . Breast cancer Maternal Aunt        aunts  . Stroke Sister   . Anxiety disorder Daughter  Social History   Socioeconomic History  . Marital status: Widowed    Spouse name: Not on file  . Number of children: 2  . Years of education: Master's  . Highest education level: Not on file  Social Needs  . Financial resource strain: Not on file  . Food insecurity - worry: Not on file  . Food insecurity - inability: Not on file  . Transportation needs - medical: Not on file  . Transportation needs - non-medical: Not on file  Occupational History  . Occupation: retired    Fish farm manager: RETIRED  Tobacco Use  . Smoking status: Former Research scientist (life sciences)  . Smokeless tobacco: Never Used  Substance and Sexual Activity  . Alcohol use: Yes    Comment: 1 glass of wine a day  . Drug use: No  .  Sexual activity: Not on file  Other Topics Concern  . Not on file  Social History Narrative   Patient is widowed and lives alone.   Patient has two adult children.   Patient is retired.   Patient has a Scientist, water quality.   Patient is right-handed.   Patient drinks two cups of coffee daily.     Physical Exam: BP 120/80   Pulse 84   Ht _0  (1.6 m)   Wt 143 lb (64.9 kg)   BMI 25.33 kg/m  Constitutional: generally well-appearing Psychiatric: alert and oriented x3 Eyes: extraocular movements intact Mouth: oral pharynx moist, no lesions Neck: supple no lymphadenopathy Cardiovascular: heart regular rate and rhythm Lungs: clear to auscultation bilaterally Abdomen: soft, nontender, nondistended, no obvious ascites, no peritoneal signs, normal bowel sounds Extremities: no lower extremity edema bilaterally Skin: no lesions on visible extremities   Assessment and plan: 82 y.o. female with chronic loose stools, routine risk for colon cancer  First she has chronically loose stools that are somewhat aggravated by certain foods.  Since her gallbladder was removed these problems seem to be worse.  I suspect this is related to bile irritating her colon.  I recommended a trial of cholestyramine powder which she will try once daily.  We will also get a basic set of stool testing to check for chronic infections which I doubt to be the case.  She is due for colon cancer screening since her last colonoscopy was about 11 years ago.  We will arrange for that to be done at her soonest convenience.    Please see the "Patient Instructions" section for addition details about the plan.   Owens Loffler, MD [facility] 10/06/2017, 9:53 AM  Cc: Hoyt Koch, *

## 2017-10-06 NOTE — Patient Instructions (Addendum)
Trial of cholestyramine 4gm powder, one dose once  daily.  Only skip a dose if you have become constipated.  Disp 1 month with 6 refills.  You will have labs checked today in the basement lab.  Please head down after you check out with the front desk  (stool for ova and parasites, stool for C. Diff by PCR and toxin, stool for routine stool culture).  You will be set up for a colonoscopy for colon cancer screening.  Normal BMI (Body Mass Index- based on height and weight) is between 23 and 30. Your BMI today is Body mass index is 25.33 kg/m. Marland Kitchen Please consider follow up  regarding your BMI with your Primary Care Provider.

## 2017-10-13 ENCOUNTER — Ambulatory Visit (INDEPENDENT_AMBULATORY_CARE_PROVIDER_SITE_OTHER): Payer: Medicare Other | Admitting: Internal Medicine

## 2017-10-13 ENCOUNTER — Other Ambulatory Visit (INDEPENDENT_AMBULATORY_CARE_PROVIDER_SITE_OTHER): Payer: Medicare Other

## 2017-10-13 ENCOUNTER — Encounter: Payer: Self-pay | Admitting: Internal Medicine

## 2017-10-13 VITALS — BP 116/70 | HR 90 | Temp 97.7°F | Ht 63.0 in | Wt 143.0 lb

## 2017-10-13 DIAGNOSIS — M1711 Unilateral primary osteoarthritis, right knee: Secondary | ICD-10-CM | POA: Diagnosis not present

## 2017-10-13 DIAGNOSIS — Z0001 Encounter for general adult medical examination with abnormal findings: Secondary | ICD-10-CM

## 2017-10-13 DIAGNOSIS — Z0000 Encounter for general adult medical examination without abnormal findings: Secondary | ICD-10-CM

## 2017-10-13 DIAGNOSIS — E785 Hyperlipidemia, unspecified: Secondary | ICD-10-CM | POA: Diagnosis not present

## 2017-10-13 DIAGNOSIS — N3946 Mixed incontinence: Secondary | ICD-10-CM | POA: Diagnosis not present

## 2017-10-13 LAB — COMPREHENSIVE METABOLIC PANEL
ALBUMIN: 4.2 g/dL (ref 3.5–5.2)
ALK PHOS: 57 U/L (ref 39–117)
ALT: 13 U/L (ref 0–35)
AST: 21 U/L (ref 0–37)
BUN: 17 mg/dL (ref 6–23)
CHLORIDE: 105 meq/L (ref 96–112)
CO2: 31 mEq/L (ref 19–32)
Calcium: 9.5 mg/dL (ref 8.4–10.5)
Creatinine, Ser: 0.72 mg/dL (ref 0.40–1.20)
GFR: 82.88 mL/min (ref >60.00)
GLUCOSE: 80 mg/dL (ref 70–99)
POTASSIUM: 4.6 meq/L (ref 3.5–5.1)
Sodium: 141 mEq/L (ref 135–145)
TOTAL PROTEIN: 6.8 g/dL (ref 6.0–8.3)
Total Bilirubin: 0.4 mg/dL (ref 0.2–1.2)

## 2017-10-13 LAB — LIPID PANEL
CHOLESTEROL: 182 mg/dL (ref 0–200)
HDL: 44.5 mg/dL (ref >39.00)
NonHDL: 137.22
Total CHOL/HDL Ratio: 4
Triglycerides: 208 mg/dL — ABNORMAL HIGH (ref 0.0–149.0)
VLDL: 41.6 mg/dL — AB (ref 0.0–40.0)

## 2017-10-13 LAB — CBC
HEMATOCRIT: 42.8 % (ref 36.0–46.0)
HEMOGLOBIN: 14.7 g/dL (ref 12.0–15.0)
MCHC: 34.3 g/dL (ref 30.0–36.0)
MCV: 91 fl (ref 78.0–100.0)
Platelets: 306 10*3/uL (ref 150.0–400.0)
RBC: 4.71 Mil/uL (ref 3.87–5.11)
RDW: 13.3 % (ref 11.5–15.5)
WBC: 9.3 10*3/uL (ref 4.0–10.5)

## 2017-10-13 LAB — LDL CHOLESTEROL, DIRECT: Direct LDL: 117 mg/dL

## 2017-10-13 MED ORDER — MIRABEGRON ER 50 MG PO TB24: 50.0000 mg | ORAL_TABLET | Freq: Every day | ORAL | 11 refills | Status: DC

## 2017-10-13 NOTE — Patient Instructions (Addendum)
We have sent in myrbetriq for the bladder. Take 1 pill daily for 1 month then decide if it is helping.   You can stay off the aspirin.  It is okay to start taking turmeric for the arthritis and osteobiflex.  Health Maintenance, Female Adopting a healthy lifestyle and getting preventive care can go a long way to promote health and wellness. Talk with your health care provider about what schedule of regular examinations is right for you. This is a good chance for you to check in with your provider about disease prevention and staying healthy. In between checkups, there are plenty of things you can do on your own. Experts have done a lot of research about which lifestyle changes and preventive measures are most likely to keep you healthy. Ask your health care provider for more information. Weight and diet Eat a healthy diet  Be sure to include plenty of vegetables, fruits, low-fat dairy products, and lean protein.  Do not eat a lot of foods high in solid fats, added sugars, or salt.  Get regular exercise. This is one of the most important things you can do for your health. ? Most adults should exercise for at least 150 minutes each week. The exercise should increase your heart rate and make you sweat (moderate-intensity exercise). ? Most adults should also do strengthening exercises at least twice a week. This is in addition to the moderate-intensity exercise.  Maintain a healthy weight  Body mass index (BMI) is a measurement that can be used to identify possible weight problems. It estimates body fat based on height and weight. Your health care provider can help determine your BMI and help you achieve or maintain a healthy weight.  For females 23 years of age and older: ? A BMI below 18.5 is considered underweight. ? A BMI of 18.5 to 24.9 is normal. ? A BMI of 25 to 29.9 is considered overweight. ? A BMI of 30 and above is considered obese.  Watch levels of cholesterol and blood  lipids  You should start having your blood tested for lipids and cholesterol at 80 years of age, then have this test every 5 years.  You may need to have your cholesterol levels checked more often if: ? Your lipid or cholesterol levels are high. ? You are older than 80 years of age. ? You are at high risk for heart disease.  Cancer screening Lung Cancer  Lung cancer screening is recommended for adults 3-7 years old who are at high risk for lung cancer because of a history of smoking.  A yearly low-dose CT scan of the lungs is recommended for people who: ? Currently smoke. ? Have quit within the past 15 years. ? Have at least a 30-pack-year history of smoking. A pack year is smoking an average of one pack of cigarettes a day for 1 year.  Yearly screening should continue until it has been 15 years since you quit.  Yearly screening should stop if you develop a health problem that would prevent you from having lung cancer treatment.  Breast Cancer  Practice breast self-awareness. This means understanding how your breasts normally appear and feel.  It also means doing regular breast self-exams. Let your health care provider know about any changes, no matter how small.  If you are in your 20s or 30s, you should have a clinical breast exam (CBE) by a health care provider every 1-3 years as part of a regular health exam.  If you are  12 or older, have a CBE every year. Also consider having a breast X-ray (mammogram) every year.  If you have a family history of breast cancer, talk to your health care provider about genetic screening.  If you are at high risk for breast cancer, talk to your health care provider about having an MRI and a mammogram every year.  Breast cancer gene (BRCA) assessment is recommended for women who have family members with BRCA-related cancers. BRCA-related cancers include: ? Breast. ? Ovarian. ? Tubal. ? Peritoneal cancers.  Results of the assessment will  determine the need for genetic counseling and BRCA1 and BRCA2 testing.  Cervical Cancer Your health care provider may recommend that you be screened regularly for cancer of the pelvic organs (ovaries, uterus, and vagina). This screening involves a pelvic examination, including checking for microscopic changes to the surface of your cervix (Pap test). You may be encouraged to have this screening done every 3 years, beginning at age 85.  For women ages 47-65, health care providers may recommend pelvic exams and Pap testing every 3 years, or they may recommend the Pap and pelvic exam, combined with testing for human papilloma virus (HPV), every 5 years. Some types of HPV increase your risk of cervical cancer. Testing for HPV may also be done on women of any age with unclear Pap test results.  Other health care providers may not recommend any screening for nonpregnant women who are considered low risk for pelvic cancer and who do not have symptoms. Ask your health care provider if a screening pelvic exam is right for you.  If you have had past treatment for cervical cancer or a condition that could lead to cancer, you need Pap tests and screening for cancer for at least 20 years after your treatment. If Pap tests have been discontinued, your risk factors (such as having a new sexual partner) need to be reassessed to determine if screening should resume. Some women have medical problems that increase the chance of getting cervical cancer. In these cases, your health care provider may recommend more frequent screening and Pap tests.  Colorectal Cancer  This type of cancer can be detected and often prevented.  Routine colorectal cancer screening usually begins at 80 years of age and continues through 80 years of age.  Your health care provider may recommend screening at an earlier age if you have risk factors for colon cancer.  Your health care provider may also recommend using home test kits to check  for hidden blood in the stool.  A small camera at the end of a tube can be used to examine your colon directly (sigmoidoscopy or colonoscopy). This is done to check for the earliest forms of colorectal cancer.  Routine screening usually begins at age 67.  Direct examination of the colon should be repeated every 5-10 years through 80 years of age. However, you may need to be screened more often if early forms of precancerous polyps or small growths are found.  Skin Cancer  Check your skin from head to toe regularly.  Tell your health care provider about any new moles or changes in moles, especially if there is a change in a mole's shape or color.  Also tell your health care provider if you have a mole that is larger than the size of a pencil eraser.  Always use sunscreen. Apply sunscreen liberally and repeatedly throughout the day.  Protect yourself by wearing long sleeves, pants, a wide-brimmed hat, and sunglasses whenever you  are outside.  Heart disease, diabetes, and high blood pressure  High blood pressure causes heart disease and increases the risk of stroke. High blood pressure is more likely to develop in: ? People who have blood pressure in the high end of the normal range (130-139/85-89 mm Hg). ? People who are overweight or obese. ? People who are African American.  If you are 77-66 years of age, have your blood pressure checked every 3-5 years. If you are 46 years of age or older, have your blood pressure checked every year. You should have your blood pressure measured twice-once when you are at a hospital or clinic, and once when you are not at a hospital or clinic. Record the average of the two measurements. To check your blood pressure when you are not at a hospital or clinic, you can use: ? An automated blood pressure machine at a pharmacy. ? A home blood pressure monitor.  If you are between 37 years and 59 years old, ask your health care provider if you should take  aspirin to prevent strokes.  Have regular diabetes screenings. This involves taking a blood sample to check your fasting blood sugar level. ? If you are at a normal weight and have a low risk for diabetes, have this test once every three years after 80 years of age. ? If you are overweight and have a high risk for diabetes, consider being tested at a younger age or more often. Preventing infection Hepatitis B  If you have a higher risk for hepatitis B, you should be screened for this virus. You are considered at high risk for hepatitis B if: ? You were born in a country where hepatitis B is common. Ask your health care provider which countries are considered high risk. ? Your parents were born in a high-risk country, and you have not been immunized against hepatitis B (hepatitis B vaccine). ? You have HIV or AIDS. ? You use needles to inject street drugs. ? You live with someone who has hepatitis B. ? You have had sex with someone who has hepatitis B. ? You get hemodialysis treatment. ? You take certain medicines for conditions, including cancer, organ transplantation, and autoimmune conditions.  Hepatitis C  Blood testing is recommended for: ? Everyone born from [birth year] through 1965. ? Anyone with known risk factors for hepatitis C.  Sexually transmitted infections (STIs)  You should be screened for sexually transmitted infections (STIs) including gonorrhea and chlamydia if: ? You are sexually active and are younger than 80 years of age. ? You are older than 80 years of age and your health care provider tells you that you are at risk for this type of infection. ? Your sexual activity has changed since you were last screened and you are at an increased risk for chlamydia or gonorrhea. Ask your health care provider if you are at risk.  If you do not have HIV, but are at risk, it may be recommended that you take a prescription medicine daily to prevent HIV infection. This is called  pre-exposure prophylaxis (PrEP). You are considered at risk if: ? You are sexually active and do not regularly use condoms or know the HIV status of your partner(s). ? You take drugs by injection. ? You are sexually active with a partner who has HIV.  Talk with your health care provider about whether you are at high risk of being infected with HIV. If you choose to begin PrEP, you should first be  tested for HIV. You should then be tested every 3 months for as long as you are taking PrEP. Pregnancy  If you are premenopausal and you may become pregnant, ask your health care provider about preconception counseling.  If you may become pregnant, take 400 to 800 micrograms (mcg) of folic acid every day.  If you want to prevent pregnancy, talk to your health care provider about birth control (contraception). Osteoporosis and menopause  Osteoporosis is a disease in which the bones lose minerals and strength with aging. This can result in serious bone fractures. Your risk for osteoporosis can be identified using a bone density scan.  If you are 65 years of age or older, or if you are at risk for osteoporosis and fractures, ask your health care provider if you should be screened.  Ask your health care provider whether you should take a calcium or vitamin D supplement to lower your risk for osteoporosis.  Menopause may have certain physical symptoms and risks.  Hormone replacement therapy may reduce some of these symptoms and risks. Talk to your health care provider about whether hormone replacement therapy is right for you. Follow these instructions at home:  Schedule regular health, dental, and eye exams.  Stay current with your immunizations.  Do not use any tobacco products including cigarettes, chewing tobacco, or electronic cigarettes.  If you are pregnant, do not drink alcohol.  If you are breastfeeding, limit how much and how often you drink alcohol.  Limit alcohol intake to no more  than 1 drink per day for nonpregnant women. One drink equals 12 ounces of beer, 5 ounces of wine, or 1 ounces of hard liquor.  Do not use street drugs.  Do not share needles.  Ask your health care provider for help if you need support or information about quitting drugs.  Tell your health care provider if you often feel depressed.  Tell your health care provider if you have ever been abused or do not feel safe at home. This information is not intended to replace advice given to you by your health care provider. Make sure you discuss any questions you have with your health care provider. Document Released: 03/24/2011 Document Revised: 02/14/2016 Document Reviewed: 06/12/2015 Elsevier Interactive Patient Education  2018 Elsevier Inc.  

## 2017-10-13 NOTE — Progress Notes (Signed)
   Subjective:    Patient ID: Angela Barber, female    DOB: March 25, 1936, 82 y.o.   MRN: [id number]  HPI The patient is a 82 YO female coming in for physical. She has several concerns including her arthritis as well as stomach concerns.   PMH, [facility], social history reviewed and updated.   Review of Systems  Constitutional: Positive for activity change. Negative for appetite change, chills, fever and unexpected weight change.  HENT: Negative.   Eyes: Negative.   Respiratory: Negative for cough, chest tightness and shortness of breath.   Cardiovascular: Negative for chest pain, palpitations and leg swelling.  Gastrointestinal: Positive for diarrhea. Negative for abdominal distention, abdominal pain, constipation, nausea and vomiting.  Musculoskeletal: Positive for arthralgias and myalgias. Negative for back pain, gait problem and joint swelling.  Skin: Negative.   Neurological: Negative.   Psychiatric/Behavioral: Negative.       Objective:   Physical Exam  Constitutional: She is oriented to person, place, and time. She appears well-developed and well-nourished.  HENT:  Head: Normocephalic and atraumatic.  Eyes: EOM are normal.  Neck: Normal range of motion.  Cardiovascular: Normal rate and regular rhythm.  Pulmonary/Chest: Effort normal and breath sounds normal. No respiratory distress. She has no wheezes. She has no rales.  Abdominal: Soft. Bowel sounds are normal. She exhibits no distension. There is no tenderness. There is no rebound.  Musculoskeletal: She exhibits no edema.  Neurological: She is alert and oriented to person, place, and time. Coordination normal.  Skin: Skin is warm and dry.  Psychiatric: She has a normal mood and affect.   Vitals:   10/13/17 1031  BP: 116/70  Pulse: 90  Temp: 97.7 F (36.5 C)  TempSrc: Oral  SpO2: 97%  Weight: 143 lb (64.9 kg)  Height: 5\' 3"  (1.6 m)      Assessment & Plan:  Flu shot given at visit

## 2017-10-14 ENCOUNTER — Encounter: Payer: Self-pay | Admitting: Internal Medicine

## 2017-10-14 ENCOUNTER — Encounter: Payer: Self-pay | Admitting: Gastroenterology

## 2017-10-14 DIAGNOSIS — R32 Unspecified urinary incontinence: Secondary | ICD-10-CM | POA: Insufficient documentation

## 2017-10-14 NOTE — Assessment & Plan Note (Signed)
Rx for myrbetriq for incontinence. Talked to her about limiting liquids in the evening to cut down on night time urination.

## 2017-10-14 NOTE — Assessment & Plan Note (Addendum)
Colonoscopy up to date and mammogram. Flu given and tetanus and pneumonia up to date. Counseled about shingrix. Counseled about mole surveillance and sun safety. Given 10 year screening recommendations.

## 2017-10-14 NOTE — Assessment & Plan Note (Signed)
Checking lipid panel and adjust as needed. Not on meds currently.  

## 2017-10-14 NOTE — Assessment & Plan Note (Signed)
She does not want repeat injections as they were only mildly helpful. Advised turmeric and tylenol for pain. Discussed maximum dosing. She does not want to pursue any surgical options at this time. She is able to stand the pain.

## 2017-10-16 ENCOUNTER — Other Ambulatory Visit: Payer: Self-pay

## 2017-10-16 DIAGNOSIS — Z1211 Encounter for screening for malignant neoplasm of colon: Secondary | ICD-10-CM

## 2017-10-16 DIAGNOSIS — R195 Other fecal abnormalities: Secondary | ICD-10-CM

## 2017-10-19 LAB — OVA AND PARASITE EXAMINATION
CONCENTRATE RESULT:: NONE SEEN
MICRO NUMBER:: 90108665
SPECIMEN QUALITY:: ADEQUATE
TRICHROME RESULT:: NONE SEEN

## 2017-10-20 LAB — STOOL CULTURE
MICRO NUMBER:: 90108648
MICRO NUMBER:: 90108649
MICRO NUMBER:: 90108650
SHIGA RESULT:: NOT DETECTED
SPECIMEN QUALITY:: ADEQUATE
SPECIMEN QUALITY:: ADEQUATE
SPECIMEN QUALITY:: ADEQUATE

## 2017-10-28 ENCOUNTER — Ambulatory Visit (AMBULATORY_SURGERY_CENTER): Payer: Medicare Other | Admitting: Gastroenterology

## 2017-10-28 ENCOUNTER — Encounter: Payer: Self-pay | Admitting: Gastroenterology

## 2017-10-28 ENCOUNTER — Other Ambulatory Visit: Payer: Self-pay

## 2017-10-28 VITALS — BP 136/74 | HR 85 | Temp 98.4°F | Resp 13 | Ht 63.0 in | Wt 143.0 lb

## 2017-10-28 DIAGNOSIS — Z1212 Encounter for screening for malignant neoplasm of rectum: Secondary | ICD-10-CM

## 2017-10-28 DIAGNOSIS — Z1211 Encounter for screening for malignant neoplasm of colon: Secondary | ICD-10-CM

## 2017-10-28 DIAGNOSIS — D122 Benign neoplasm of ascending colon: Secondary | ICD-10-CM | POA: Diagnosis not present

## 2017-10-28 DIAGNOSIS — D120 Benign neoplasm of cecum: Secondary | ICD-10-CM

## 2017-10-28 MED ORDER — SODIUM CHLORIDE 0.9 % IV SOLN: 500.0000 mL | Freq: Once | INTRAVENOUS | Status: DC

## 2017-10-28 NOTE — Patient Instructions (Signed)
YOU HAD AN ENDOSCOPIC PROCEDURE TODAY AT THE [facility]:   Refer to the procedure report that was given to you for any specific questions about what was found during the examination.  If the procedure report does not answer your questions, please call your gastroenterologist to clarify.  If you requested that your care partner not be given the details of your procedure findings, then the procedure report has been included in a sealed envelope for you to review at your convenience later.  YOU SHOULD EXPECT: Some feelings of bloating in the abdomen. Passage of more gas than usual.  Walking can help get rid of the air that was put into your GI tract during the procedure and reduce the bloating. If you had a lower endoscopy (such as a colonoscopy or flexible sigmoidoscopy) you may notice spotting of blood in your stool or on the toilet paper. If you underwent a bowel prep for your procedure, you may not have a normal bowel movement for a few days.  Please Note:  You might notice some irritation and congestion in your nose or some drainage.  This is from the oxygen used during your procedure.  There is no need for concern and it should clear up in a day or so.  SYMPTOMS TO REPORT IMMEDIATELY:   Following lower endoscopy (colonoscopy or flexible sigmoidoscopy):  Excessive amounts of blood in the stool  Significant tenderness or worsening of abdominal pains  Swelling of the abdomen that is new, acute  Fever of 100F or higher    For urgent or emergent issues, a gastroenterologist can be reached at any hour by calling [phone].   DIET:  We do recommend a small meal at first, but then you may proceed to your regular diet.  Drink plenty of fluids but you should avoid alcoholic beverages for 24 hours.  ACTIVITY:  You should plan to take it easy for the rest of today and you should NOT DRIVE or use heavy machinery until tomorrow (because of the sedation medicines used during the test).     FOLLOW UP: Our staff will call the number listed on your records the next business day following your procedure to check on you and address any questions or concerns that you may have regarding the information given to you following your procedure. If we do not reach you, we will leave a message.  However, if you are feeling well and you are not experiencing any problems, there is no need to return our call.  We will assume that you have returned to your regular daily activities without incident.  If any biopsies were taken you will be contacted by phone or by letter within the next 1-3 weeks.  Please call us at [phone] if you have not heard about the biopsies in 3 weeks.    SIGNATURES/CONFIDENTIALITY: You and/or your care partner have signed paperwork which will be entered into your electronic medical record.  These signatures attest to the fact that that the information above on your After Visit Summary has been reviewed and is understood.  Full responsibility of the confidentiality of this discharge information lies with you and/or your care-partner.   Resume medications. Information given on polyps and diverticulosis. 

## 2017-10-28 NOTE — Progress Notes (Signed)
Called to room to assist during endoscopic procedure.  Patient ID and intended procedure confirmed with present staff. Received instructions for my participation in the procedure from the performing physician.  

## 2017-10-28 NOTE — Progress Notes (Signed)
Report given to PACU, vss 

## 2017-10-28 NOTE — Op Note (Signed)
[place] Patient Name: Angela Barber Procedure Date: 10/28/2017 2:45 PM MRN: [id number] Endoscopist: Milus Banister , MD Age: 82 Referring MD:  Date of Birth: 06-13-1936 Gender: Female Account #: [id number] Procedure:                Colonoscopy Indications:              Screening for colorectal malignant neoplasm Medicines:                Monitored Anesthesia Care Procedure:                Pre-Anesthesia Assessment:                           - Prior to the procedure, a History and Physical                            was performed, and patient medications and                            allergies were reviewed. The patient's tolerance of                            previous anesthesia was also reviewed. The risks                            and benefits of the procedure and the sedation                            options and risks were discussed with the patient.                            All questions were answered, and informed consent                            was obtained. Prior Anticoagulants: The patient has                            taken no previous anticoagulant or antiplatelet                            agents. ASA Grade Assessment: II - A patient with                            mild systemic disease. After reviewing the risks                            and benefits, the patient was deemed in                            satisfactory condition to undergo the procedure.                           After obtaining informed consent, the colonoscope  was passed under direct vision. Throughout the                            procedure, the patient's blood pressure, pulse, and                            oxygen saturations were monitored continuously. The                            Colonoscope was introduced through the anus and                            advanced to the the cecum, identified by                            appendiceal orifice and  ileocecal valve. The                            colonoscopy was performed without difficulty. The                            patient tolerated the procedure well. The quality                            of the bowel preparation was good. The ileocecal                            valve, appendiceal orifice, and rectum were                            photographed. Scope In: 2:47:01 PM Scope Out: 3:01:11 PM Scope Withdrawal Time: 0 hours 9 minutes 16 seconds  Total Procedure Duration: 0 hours 14 minutes 10 seconds  Findings:                 Two sessile polyps were found in the ascending                            colon. The polyps were 2 to 3 mm in size. These                            polyps were removed with a cold snare. Resection                            and retrieval were complete.                           Multiple small and large-mouthed diverticula were                            found in the left colon.                           The exam was otherwise without abnormality on  direct and retroflexion views. Complications:            No immediate complications. Estimated blood loss:                            None. Estimated Blood Loss:     Estimated blood loss: none. Impression:               - Two 2 to 3 mm polyps in the ascending colon,                            removed with a cold snare. Resected and retrieved.                           - Diverticulosis in the left colon.                           - The examination was otherwise normal on direct                            and retroflexion views. Recommendation:           - Patient has a contact number available for                            emergencies. The signs and symptoms of potential                            delayed complications were discussed with the                            patient. Return to normal activities tomorrow.                            Written discharge instructions were  provided to the                            patient.                           - Resume previous diet.                           - Continue present medications.                           - Await path report for final recommendations. Milus Banister, MD 10/28/2017 3:05:43 PM This report has been signed electronically.

## 2017-10-29 ENCOUNTER — Telehealth: Payer: Self-pay

## 2017-10-29 NOTE — Telephone Encounter (Signed)
  Follow up Call-  Call back number 10/28/2017  Post procedure Call Back phone  # [phone] (please txt d/t hearing, per MO)  Some recent data might be hidden     Patient questions:  Do you have a fever, pain , or abdominal swelling? No. Pain Score  0 *  Have you tolerated food without any problems? Yes.    Have you been able to return to your normal activities? Yes.    Do you have any questions about your discharge instructions: Diet   No. Medications  No. Follow up visit  No.  Do you have questions or concerns about your Care? No.  Actions: * If pain score is 4 or above: No action needed, pain <4.

## 2017-10-30 LAB — CLOSTRIDIUM DIFFICILE BY PCR: Toxigenic C. Difficile by PCR: NEGATIVE

## 2017-11-03 ENCOUNTER — Encounter: Payer: Self-pay | Admitting: Gastroenterology

## 2017-12-11 ENCOUNTER — Encounter: Payer: Self-pay | Admitting: Family

## 2017-12-11 ENCOUNTER — Ambulatory Visit: Payer: Medicare Other | Admitting: Family

## 2017-12-11 VITALS — BP 130/80 | HR 96 | Temp 100.1°F | Ht 63.0 in | Wt 145.1 lb

## 2017-12-11 DIAGNOSIS — J019 Acute sinusitis, unspecified: Secondary | ICD-10-CM

## 2017-12-11 DIAGNOSIS — B370 Candidal stomatitis: Secondary | ICD-10-CM | POA: Diagnosis not present

## 2017-12-11 MED ORDER — NYSTATIN 100000 UNIT/ML MT SUSP: 5.0000 mL | Freq: Four times a day (QID) | OROMUCOSAL | 0 refills | Status: DC

## 2017-12-11 MED ORDER — CEFDINIR 300 MG PO CAPS: 300.0000 mg | ORAL_CAPSULE | Freq: Two times a day (BID) | ORAL | 0 refills | Status: DC

## 2017-12-11 MED ORDER — BENZONATATE 100 MG PO CAPS: 100.0000 mg | ORAL_CAPSULE | Freq: Three times a day (TID) | ORAL | 0 refills | Status: DC | PRN

## 2017-12-11 NOTE — Progress Notes (Signed)
Angela Barber is a 82 y.o. female with the following history as recorded in EpicCare:  Patient Active Problem List   Diagnosis Date Noted  . Urinary incontinence 10/14/2017  . Irregular heart beat 08/09/2016  . Degenerative arthritis of right knee 06/04/2016  . Degenerative arthritis of left knee 04/10/2016  . Symptomatic varicose veins of both lower extremities 07/25/2015  . Routine general medical examination at a health care facility 08/13/2014  . 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 Adjustment disorder with mixed anxiety and depressed mood 05/19/2013  . Hyperlipidemia 05/24/2009  . Hearing loss 04/25/2008  . CLOSTRIDIUM DIFFICILE COLITIS, HX OF 10/23/2007    Current Outpatient Medications  Medication Sig Dispense Refill  . acetaminophen (TYLENOL) 500 MG tablet Take 500 mg by mouth at bedtime as needed for mild pain (Pt takes in combination with Tylenol PM).     . bifidobacterium infantis (ALIGN) capsule Take 1 capsule by mouth daily.    . cholestyramine (QUESTRAN) 4 g packet Take 1 packet (4 g total) by mouth daily. 30 each 6  . diphenhydramine-acetaminophen (TYLENOL PM) 25-500 MG TABS tablet Take 1 tablet by mouth at bedtime as needed (sleep). Pt takes in combination with 1 tablet of 500mg  tylenol at night as needed    . mirabegron ER (MYRBETRIQ) 50 MG TB24 tablet Take 1 tablet (50 mg total) by mouth daily. 30 tablet 11  . benzonatate (TESSALON) 100 MG capsule Take 1 capsule (100 mg total) by mouth 3 (three) times daily as needed. 20 capsule 0  . cefdinir (OMNICEF) 300 MG capsule Take 1 capsule (300 mg total) by mouth 2 (two) times daily. 20 capsule 0  . nystatin (MYCOSTATIN) 100000 UNIT/ML suspension Take 5 mLs (500,000 Units total) by mouth 4 (four) times daily. 115 mL 0   Current Facility-Administered Medications  Medication Dose Route Frequency Provider Last Rate Last Dose  . 0.9 %  sodium chloride infusion  500 mL Intravenous Once Milus Banister, MD         Allergies: Patient has no known allergies.  Past Medical History:  Diagnosis Date  . Anxiety   . C. difficile diarrhea 9/06  . Cochlear implant status   . Depression   . Diverticulosis   . Hearing loss of both ears   . HLD (hyperlipidemia)   . Hyperplastic colon polyp   . Insomnia   . Internal hemorrhoid   . Osteoarthritis, knee     Past Surgical History:  Procedure Laterality Date  . BUNIONECTOMY     right  . CHOLECYSTECTOMY N/A 07/25/2016   Procedure: LAPAROSCOPIC CHOLECYSTECTOMY;  Surgeon: Jackolyn Confer, MD;  Location: [facility];  Service: General;  Laterality: N/A;  Royann Shivers IMPLANT  July '13   right ear. Dr. Idelle Crouch  . ERCP N/A 07/31/2016   Procedure: ENDOSCOPIC RETROGRADE CHOLANGIOPANCREATOGRAPHY (ERCP);  Surgeon: Milus Banister, MD;  Location: [facility];  Service: Endoscopy;  Laterality: N/A;  . ROTATOR CUFF REPAIR Left 2012  . ROTATOR CUFF REPAIR Right 2007 & 2009  . TONSILLECTOMY    . VAGINAL HYSTERECTOMY      Family History  Problem Relation Age of Onset  . Ovarian cancer Mother   . Transient ischemic attack Mother   . Breast cancer Maternal Aunt        aunts  . Stroke Sister   . Anxiety disorder Daughter     Social History   Tobacco Use  . Smoking status: Former Research scientist (life sciences)  . Smokeless tobacco: Never Used  Substance Use Topics  . Alcohol use: Yes    Comment: 1 glass of wine a day    Subjective:  Patient presents with concerns for 1 week history of cough/ congestion; + fever; + headache; not resting well at night due to cough; using OTC allergy medication with limited benefit; + sinus pain/ pressure; eyes are congested- draining bilaterally; in the process of moving and suspicious that increased dust exposure has caused allergies to flare;     Objectiv Vitals:   12/11/17 1438  BP: 130/80  Pulse: 96  Temp: 100.1 F (37.8 C)  TempSrc: Oral  SpO2: 97%  Weight: 145 lb 1.3 oz (65.8 kg)  Height: 5\' 3"  (1.6 m)    General: Well developed, well  nourished, in no acute distress  Skin : Warm and dry.  Head: Normocephalic and atraumatic  Eyes: Sclera and conjunctiva erythematous; pupils round and reactive to light; extraocular movements intact; drainage noted bilaterally Ears: External normal; canals clear; tympanic membranes normal  Oropharynx: Pink, supple. No suspicious lesions  Neck: Supple without thyromegaly, adenopathy  Lungs: Respirations unlabored; clear to auscultation bilaterally without wheeze, rales, rhonchi  CVS exam: normal rate and regular rhythm.  Neurologic: Alert and oriented; speech intact; face symmetrical; moves all extremities well; CNII-XII intact without focal deficit  Tongue has white coating c/w thrush;   Assessment:  1. Acute sinusitis, recurrence not specified, unspecified location   2. Oral thrush     Plan:  1. Rx for Omnicef 300 mg bid x 10 days, continue OTC allergy medication- agree that allergy component causing symptoms; Rx for Tessalon perles; increase fluids, rest and follow-up worse, no better. 2. Rx for Oral Nystatin- use as directed; follow-up if thrush does not resolve.   No follow-ups on file.  No orders of the defined types were placed in this encounter.   Requested Prescriptions   Signed Prescriptions Disp Refills  . cefdinir (OMNICEF) 300 MG capsule 20 capsule 0    Sig: Take 1 capsule (300 mg total) by mouth 2 (two) times daily.  Marland Kitchen nystatin (MYCOSTATIN) 100000 UNIT/ML suspension 115 mL 0    Sig: Take 5 mLs (500,000 Units total) by mouth 4 (four) times daily.  . benzonatate (TESSALON) 100 MG capsule 20 capsule 0    Sig: Take 1 capsule (100 mg total) by mouth 3 (three) times daily as needed.

## 2018-05-11 ENCOUNTER — Encounter: Payer: Self-pay | Admitting: Internal Medicine

## 2018-05-11 ENCOUNTER — Ambulatory Visit: Payer: Medicare Other | Admitting: Internal Medicine

## 2018-05-11 VITALS — BP 100/60 | HR 93 | Temp 97.9°F | Ht 63.0 in | Wt 145.0 lb

## 2018-05-11 DIAGNOSIS — M79604 Pain in right leg: Secondary | ICD-10-CM | POA: Diagnosis not present

## 2018-05-11 NOTE — Assessment & Plan Note (Signed)
Ordered US venous right leg to see if we can confirm baker's cyst. Advised to take otc medications for pain if needed. No evidence of tendon tear or knee problem on exam today and there was no fall or trauma.

## 2018-05-11 NOTE — Progress Notes (Signed)
   Subjective:    Patient ID: Angela Barber, female    DOB: 09-13-1936, 82 y.o.   MRN: [id number]  HPI The patient is an 82 YO female coming in for right leg pain. She was standing up from the cough 2 nights ago and heard and felt a pop behind her knee. She got not much pain right afterwards and went to bed as normal. Woke up feeling sore and did a little walk to see if this would help. She felt okay after that. Went to a meeting and stood up and felt severe pain and could not support her weight behind right knee. Denies swelling or rash. Denies taking anything for it. Had to be helped to car (friend drove her there) and into the house once home. Called a friend to help her with food etc the rest of the day. She woke up this morning and was feeling fairly normal. She denies having problems walking today. She is having some soreness behind her knee. Has not taken anything for pain recently. Some aleve this morning.   Review of Systems  Constitutional: Positive for activity change. Negative for appetite change, chills, fatigue, fever and unexpected weight change.  Respiratory: Negative.   Cardiovascular: Negative.   Gastrointestinal: Negative.   Musculoskeletal: Positive for arthralgias and myalgias. Negative for back pain, gait problem and joint swelling.  Skin: Negative.   Neurological: Negative.       Objective:   Physical Exam  Constitutional: She is oriented to person, place, and time. She appears well-developed and well-nourished.  HENT:  Head: Normocephalic and atraumatic.  Eyes: EOM are normal.  Neck: Normal range of motion.  Cardiovascular: Normal rate and regular rhythm.  Pulmonary/Chest: Effort normal and breath sounds normal. No respiratory distress. She has no wheezes. She has no rales.  Musculoskeletal: She exhibits tenderness. She exhibits no edema.  Pain posterior knee without swelling or cyst detected.  Neurological: She is alert and oriented to person, place, and time.  Coordination normal.  Skin: Skin is warm and dry.   Vitals:   05/11/18 1502  BP: 100/60  Pulse: 93  Temp: 97.9 F (36.6 C)  TempSrc: Oral  SpO2: 97%  Weight: 145 lb (65.8 kg)  Height: 5\' 3"  (1.6 m)      Assessment & Plan:

## 2018-05-11 NOTE — Patient Instructions (Signed)
Baker Cyst A Baker cyst, also called a popliteal cyst, is a sac-like growth that forms at the back of the knee. The cyst forms when the fluid-filled sac (bursa) that cushions the knee joint becomes enlarged. The bursa that becomes a Baker cyst is located at the back of the knee joint. What are the causes? In most cases, a Baker cyst results from another knee problem that causes swelling inside the knee. This makes the fluid inside the knee joint (synovial fluid) flow into the bursa behind the knee, causing the bursa to enlarge. What increases the risk? You may be more likely to develop a Baker cyst if you already have a knee problem, such as:  A tear in cartilage that cushions the knee joint (meniscal tear).  A tear in the tissues that connect the bones of the knee joint (ligament tear).  Knee swelling from osteoarthritis, rheumatoid arthritis, or gout.  What are the signs or symptoms? A Baker cyst does not always cause symptoms. A lump behind the knee may be the only sign of the condition. The lump may be painful, especially when the knee is straightened. If the lump is painful, the pain may come and go. The knee may also be stiff. Symptoms may quickly get more severe if the cyst breaks open (ruptures). If your cyst ruptures, signs and symptoms may affect the knee and the back of the lower leg (calf) and may include:  Sudden or worsening pain.  Swelling.  Bruising.  How is this diagnosed? This condition may be diagnosed based on your symptoms and medical history. Your health care provider will also do a physical exam. This may include:  Feeling the cyst to check whether it is tender.  Checking your knee for signs of another knee condition that causes swelling.  You may have imaging tests, such as:  X-rays.  MRI.  Ultrasound.  How is this treated? A Baker cyst that is not painful may go away without treatment. If the cyst gets large or painful, it will likely get better if the  underlying knee problem is treated. Treatment for a Baker cyst may include:  Resting.  Keeping weight off of the knee. This means not leaning on the knee to support your body weight.  NSAIDs to reduce pain and swelling.  A procedure to drain the fluid from the cyst with a needle (aspiration). You may also get an injection of a medicine that reduces swelling (steroid).  Surgery. This may be needed if other treatments do not work. This usually involves correcting knee damage and removing the cyst.  Follow these instructions at home:  Take over-the-counter and prescription medicines only as told by your health care provider.  Rest and return to your normal activities as told by your health care provider. Avoid activities that make pain or swelling worse. Ask your health care provider what activities are safe for you.  Keep all follow-up visits as told by your health care provider. This is important. Contact a health care provider if:  You have knee pain, stiffness, or swelling that does not get better. Get help right away if:  You have sudden or worsening pain and swelling in your calf area. This information is not intended to replace advice given to you by your health care provider. Make sure you discuss any questions you have with your health care provider. Document Released: 09/08/2005 Document Revised: 05/29/2016 Document Reviewed: 05/29/2016 Elsevier Interactive Patient Education  2018 [employer]  

## 2018-05-14 ENCOUNTER — Other Ambulatory Visit: Payer: Self-pay | Admitting: Family

## 2018-05-19 ENCOUNTER — Ambulatory Visit (HOSPITAL_COMMUNITY)
Admission: RE | Admit: 2018-05-19 | Discharge: 2018-05-19 | Disposition: A | Discharge status: Home / Self Care | Payer: Medicare Other | Source: Ambulatory Visit | Attending: Internal Medicine | Admitting: Internal Medicine

## 2018-05-19 DIAGNOSIS — M79604 Pain in right leg: Secondary | ICD-10-CM

## 2018-06-25 IMAGING — CR DG CHEST 2V
2 series · 2 of 2 positions shown · non-contrast
Comparison: 09/01/2013

CLINICAL DATA: Chest pressure since late evening.

EXAM:
CHEST  2 VIEW

[w chest lat]
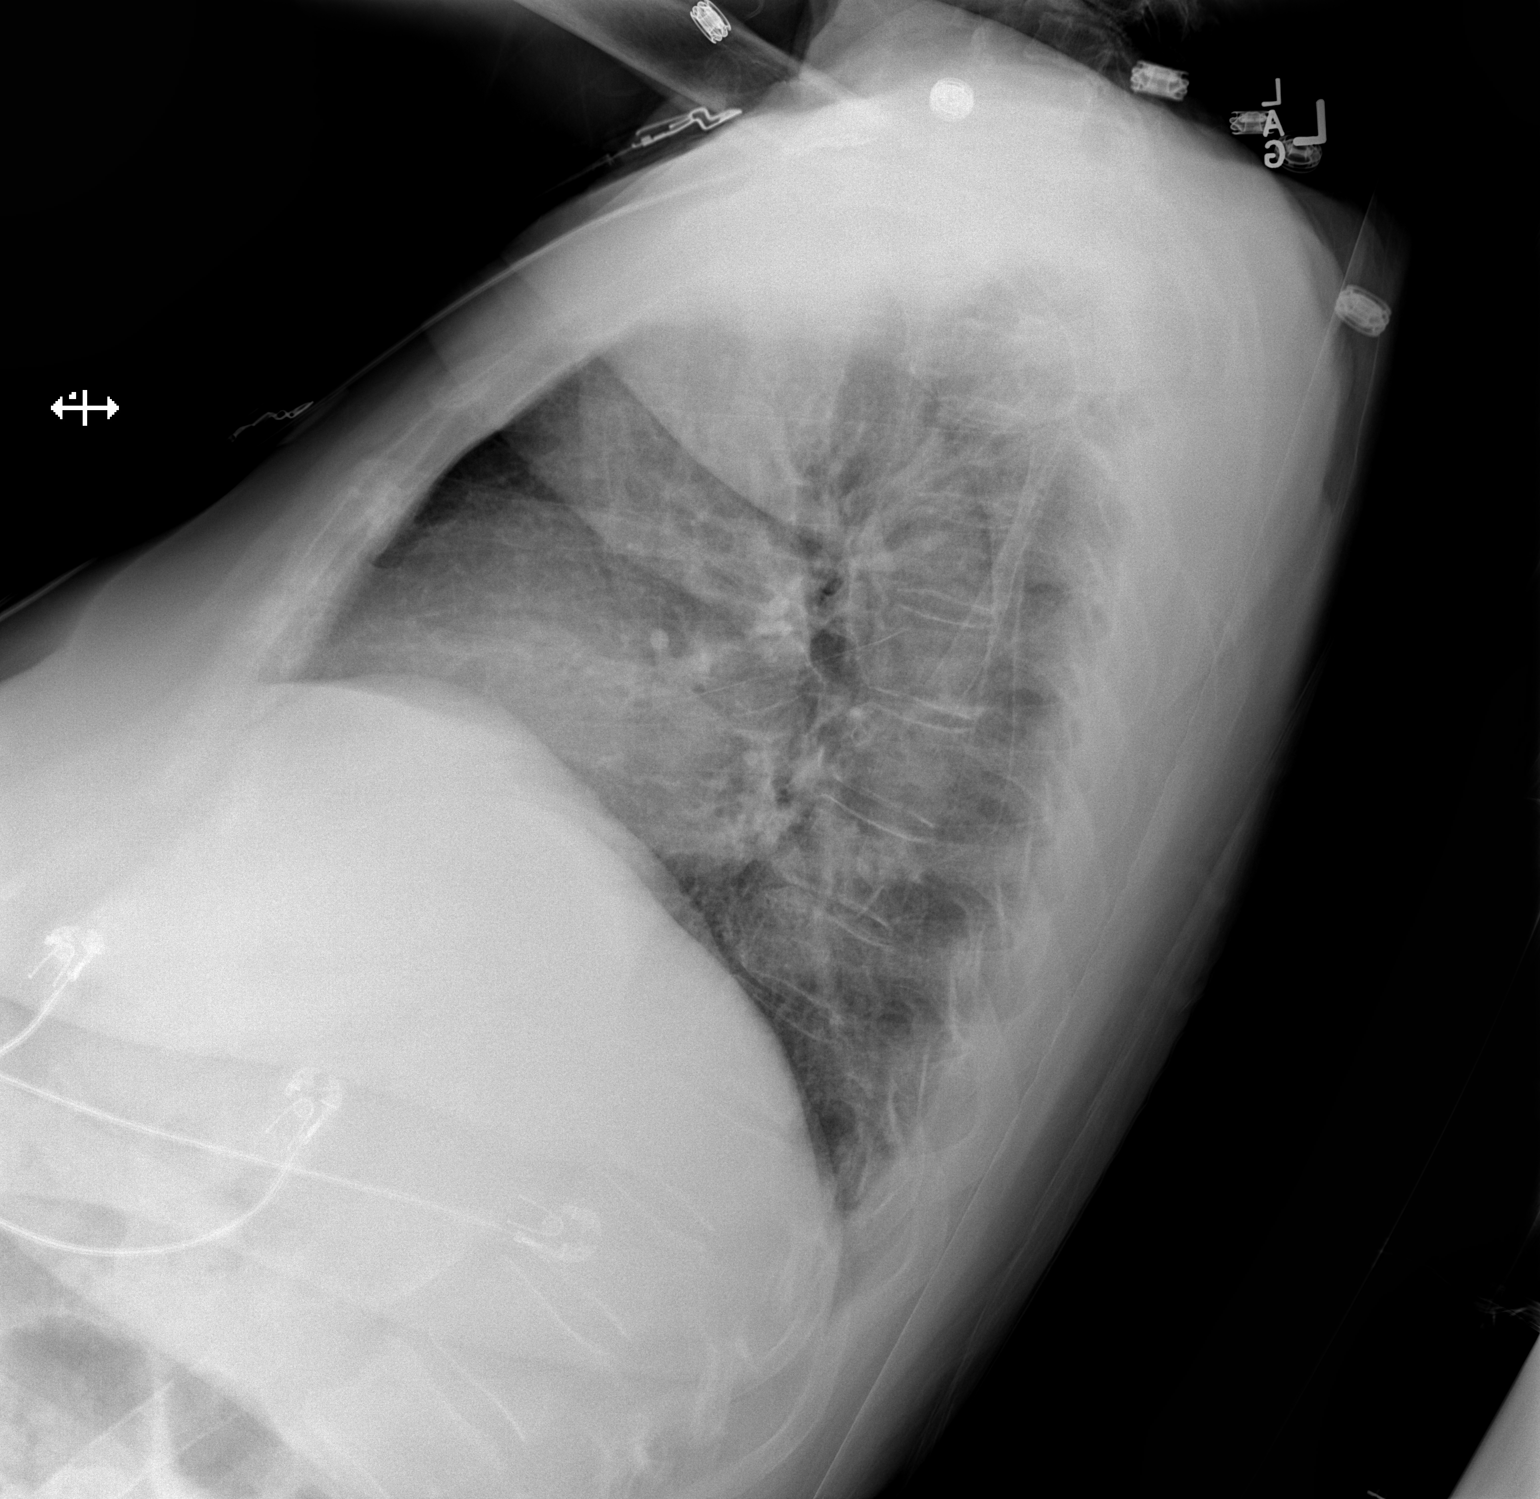

[x chest ap]
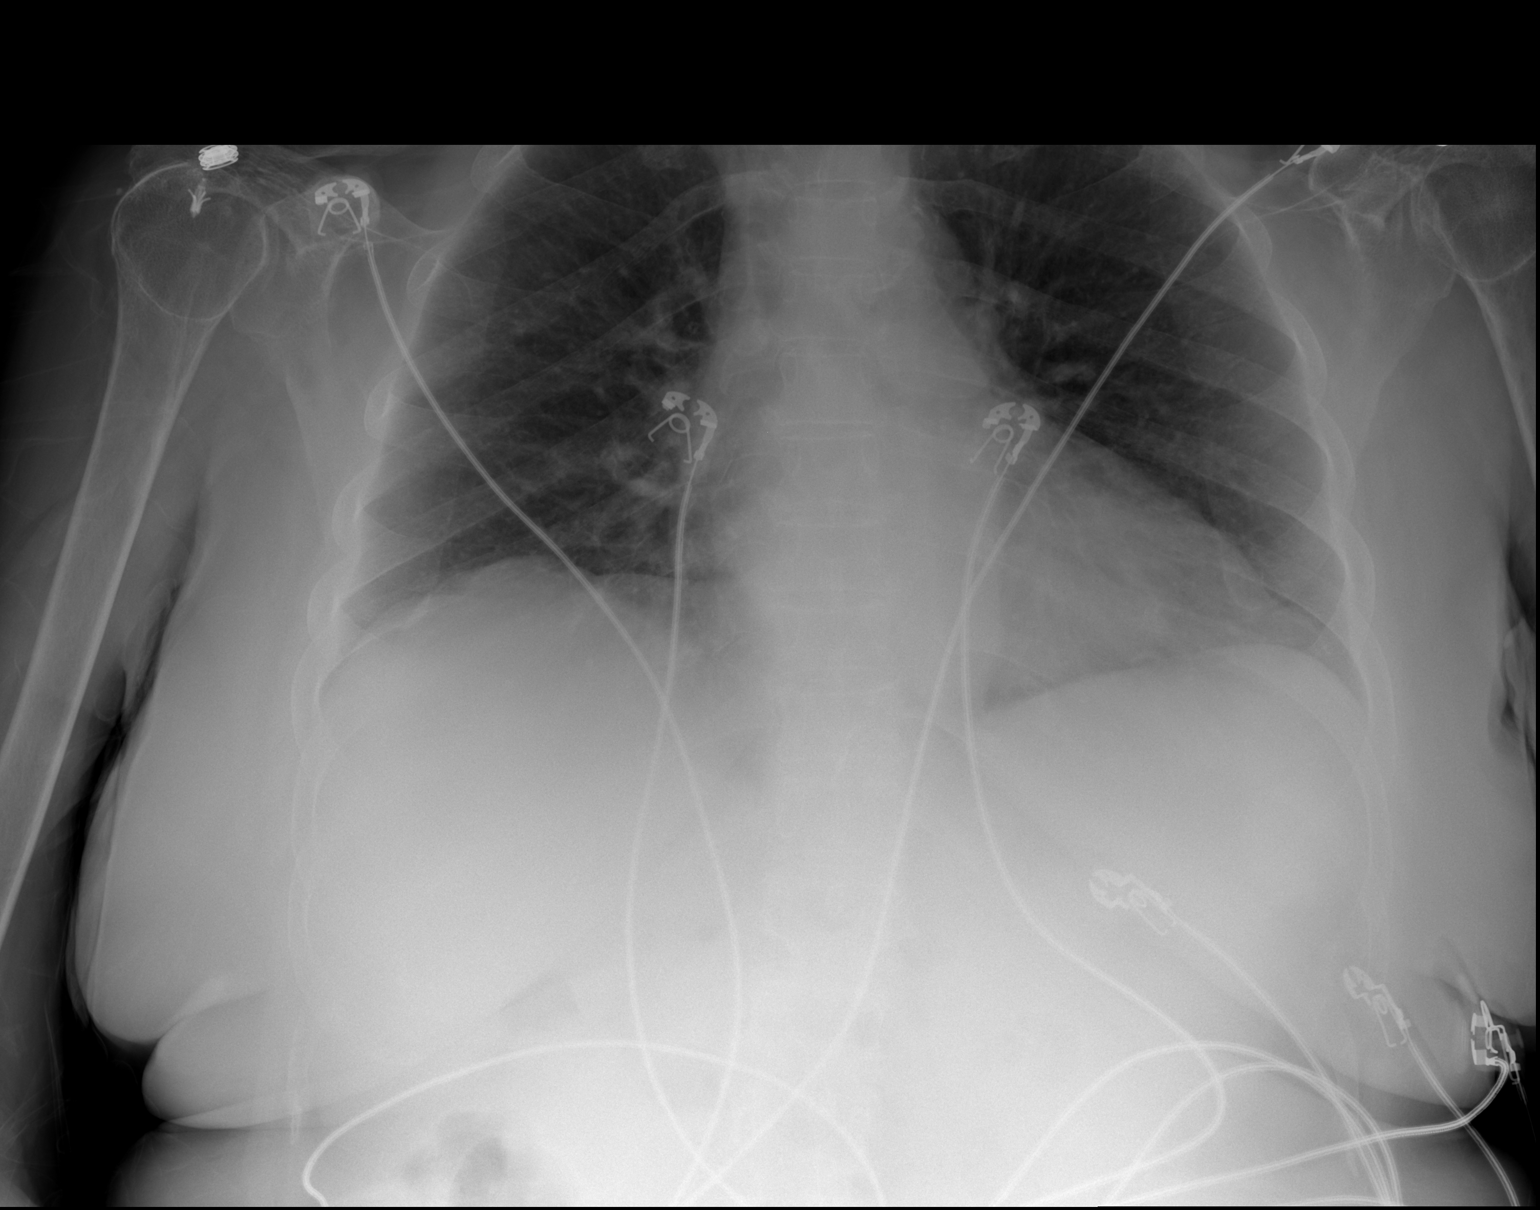

[2 of 2 positions shown; findings below may reference images not displayed]

FINDINGS: The heart size and mediastinal contours are within normal limits.
Both lungs are clear. The visualized skeletal structures are
unremarkable.
IMPRESSION: No active cardiopulmonary disease.

## 2018-08-30 ENCOUNTER — Ambulatory Visit (INDEPENDENT_AMBULATORY_CARE_PROVIDER_SITE_OTHER): Payer: Medicare Other | Admitting: Emergency Medicine

## 2018-08-30 DIAGNOSIS — Z23 Encounter for immunization: Secondary | ICD-10-CM

## 2018-09-23 ENCOUNTER — Ambulatory Visit: Payer: Medicare Other | Admitting: Internal Medicine

## 2018-09-23 DIAGNOSIS — Z0289 Encounter for other administrative examinations: Secondary | ICD-10-CM

## 2018-10-08 ENCOUNTER — Encounter: Payer: Self-pay | Admitting: Family

## 2018-10-08 ENCOUNTER — Ambulatory Visit (INDEPENDENT_AMBULATORY_CARE_PROVIDER_SITE_OTHER)
Admission: RE | Admit: 2018-10-08 | Discharge: 2018-10-08 | Disposition: A | Discharge status: Home / Self Care | Payer: Medicare Other | Source: Ambulatory Visit | Attending: Family | Admitting: Family

## 2018-10-08 ENCOUNTER — Ambulatory Visit: Payer: Medicare Other | Admitting: Family

## 2018-10-08 VITALS — BP 110/72 | HR 95 | Temp 98.2°F | Ht 63.0 in | Wt 146.1 lb

## 2018-10-08 DIAGNOSIS — J019 Acute sinusitis, unspecified: Secondary | ICD-10-CM

## 2018-10-08 DIAGNOSIS — R05 Cough: Secondary | ICD-10-CM

## 2018-10-08 DIAGNOSIS — R059 Cough, unspecified: Secondary | ICD-10-CM

## 2018-10-08 MED ORDER — CEFDINIR 300 MG PO CAPS: 300.0000 mg | ORAL_CAPSULE | Freq: Two times a day (BID) | ORAL | 0 refills | Status: DC

## 2018-10-08 MED ORDER — FLUTICASONE PROPIONATE 50 MCG/ACT NA SUSP: 2.0000 | Freq: Every day | NASAL | 6 refills | Status: DC

## 2018-10-08 MED ORDER — BENZONATATE 100 MG PO CAPS: 100.0000 mg | ORAL_CAPSULE | Freq: Three times a day (TID) | ORAL | 0 refills | Status: DC | PRN

## 2018-10-08 NOTE — Progress Notes (Signed)
Angela Barber is a 83 y.o. female with the following history as recorded in Epic[employer]:  Patient Active Problem List   Diagnosis Date Noted  . Right leg pain 05/11/2018  . Urinary incontinence 10/14/2017  . Irregular heart beat 08/09/2016  . Degenerative arthritis of right knee 06/04/2016  . Degenerative arthritis of left knee 04/10/2016  . Symptomatic varicose veins of both lower extremities 07/25/2015  . Routine general medical examination at a health care facility 08/13/2014  . 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 Adjustment disorder with mixed anxiety and depressed mood 05/19/2013  . Hyperlipidemia 05/24/2009  . Hearing loss 04/25/2008  . CLOSTRIDIUM DIFFICILE COLITIS, HX OF 10/23/2007    Current Outpatient Medications  Medication Sig Dispense Refill  . cholestyramine (QUESTRAN) 4 g packet Take 1 packet (4 g total) by mouth daily. 30 each 6  . mirabegron ER (MYRBETRIQ) 50 MG TB24 tablet Take 1 tablet (50 mg total) by mouth daily. 30 tablet 11  . benzonatate (TESSALON) 100 MG capsule Take 1 capsule (100 mg total) by mouth 3 (three) times daily as needed. 20 capsule 0  . bifidobacterium infantis (ALIGN) capsule Take 1 capsule by mouth daily.    . cefdinir (OMNICEF) 300 MG capsule Take 1 capsule (300 mg total) by mouth 2 (two) times daily. 20 capsule 0  . fluticasone (FLONASE) 50 MCG/ACT nasal spray Place 2 sprays into both nostrils daily. 16 g 6   No current facility-administered medications for this visit.     Allergies: Patient has no known allergies.  Past Medical History:  Diagnosis Date  . Anxiety   . C. difficile diarrhea 9/06  . Cochlear implant status   . Depression   . Diverticulosis   . Hearing loss of both ears   . HLD (hyperlipidemia)   . Hyperplastic colon polyp   . Insomnia   . Internal hemorrhoid   . Osteoarthritis, knee     Past Surgical History:  Procedure Laterality Date  . BUNIONECTOMY     right  . CHOLECYSTECTOMY N/A 07/25/2016   Procedure: LAPAROSCOPIC CHOLECYSTECTOMY;  Surgeon: Jackolyn Confer, MD;  Location: [facility];  Service: General;  Laterality: N/A;  Royann Shivers IMPLANT  July '13   right ear. Dr. Idelle Crouch  . ERCP N/A 07/31/2016   Procedure: ENDOSCOPIC RETROGRADE CHOLANGIOPANCREATOGRAPHY (ERCP);  Surgeon: Milus Banister, MD;  Location: [facility];  Service: Endoscopy;  Laterality: N/A;  . ROTATOR CUFF REPAIR Left 2012  . ROTATOR CUFF REPAIR Right 2007 & 2009  . TONSILLECTOMY    . VAGINAL HYSTERECTOMY      Family History  Problem Relation Age of Onset  . Ovarian cancer Mother   . Transient ischemic attack Mother   . Breast cancer Maternal Aunt        aunts  . Stroke Sister   . Anxiety disorder Daughter     Social History   Tobacco Use  . Smoking status: Former Research scientist (life sciences)  . Smokeless tobacco: Never Used  Substance Use Topics  . Alcohol use: Yes    Comment: 1 glass of wine a day    Subjective:  Patient presents with concerns for cough/ congestion x 1 month; + productive cough, low grade fever when symptoms first started; "just can't get symptoms to clear." Occasional sensation of chest tightness, sensation of heaviness. Cough has been problematic at night recently as well.      Objective:  Vitals:   10/08/18 1031  BP: 110/72  Pulse: 95  Temp: 98.2 F (36.8  C)  TempSrc: Oral  SpO2: 96%  Weight: 146 lb 1.3 oz (66.3 kg)  Height: 5\' 3"  (1.6 m)    General: Well developed, well nourished, in no acute distress  Skin : Warm and dry.  Head: Normocephalic and atraumatic  Eyes: Sclera and conjunctiva clear; pupils round and reactive to light; extraocular movements intact  Ears: External normal; canals clear; tympanic membranes normal  Oropharynx: Pink, supple. No suspicious lesions  Neck: Supple without thyromegaly, adenopathy  Lungs: Respirations unlabored; clear to auscultation bilaterally without wheeze, rales, rhonchi  CVS exam: normal rate and regular rhythm.  Neurologic: Alert and oriented;  speech intact; face symmetrical; moves all extremities well; CNII-XII intact without focal deficit   Assessment:  1. Acute sinusitis, recurrence not specified, unspecified location   2. Cough     Plan:  1. Rx for Omnicef 300 mg bid x 10 days, Flonase and Tessalon perles; 2. Update CXR today; may need to consider asthma evaluation with night-time coughing that patient has experienced in the past few months.   No follow-ups on file.  Orders Placed This Encounter  Procedures  . DG Chest 2 View    Standing Status:   Future    Number of Occurrences:   1    Standing Expiration Date:   12/07/2019    Order Specific Question:   Reason for Exam (SYMPTOM  OR DIAGNOSIS REQUIRED)    Answer:   cough x 3 weeks    Order Specific Question:   Preferred imaging location?    Answer:   [facility]    Order Specific Question:   Radiology Contrast Protocol - do NOT remove file path    Answer:   [email or web address]    Requested Prescriptions   Signed Prescriptions Disp Refills  . cefdinir (OMNICEF) 300 MG capsule 20 capsule 0    Sig: Take 1 capsule (300 mg total) by mouth 2 (two) times daily.  . benzonatate (TESSALON) 100 MG capsule 20 capsule 0    Sig: Take 1 capsule (100 mg total) by mouth 3 (three) times daily as needed.  . fluticasone (FLONASE) 50 MCG/ACT nasal spray 16 g 6    Sig: Place 2 sprays into both nostrils daily.

## 2018-10-14 ENCOUNTER — Encounter: Payer: Self-pay | Admitting: Internal Medicine

## 2018-10-20 ENCOUNTER — Encounter: Payer: Self-pay | Admitting: Internal Medicine

## 2018-10-22 ENCOUNTER — Telehealth: Payer: Self-pay | Admitting: *Deleted

## 2018-10-22 NOTE — Telephone Encounter (Signed)
LVM for patient to call back in regards to scheduling AWV with our health coach.  

## 2018-10-25 ENCOUNTER — Encounter: Payer: Self-pay | Admitting: Internal Medicine

## 2018-10-26 ENCOUNTER — Other Ambulatory Visit: Payer: Self-pay | Admitting: Internal Medicine

## 2018-10-27 ENCOUNTER — Telehealth: Payer: Self-pay | Admitting: *Deleted

## 2018-10-27 NOTE — Telephone Encounter (Signed)
LVM for patient to call back in regards to scheduling AWV with our health coach.  

## 2018-10-28 NOTE — Progress Notes (Signed)
Subjective:   Angela Barber is a 83 y.o. female who presents for an Initial Medicare Annual Wellness Visit.  Review of Systems    No ROS.  Medicare Wellness Visit. Additional risk factors are reflected in the social history.  Cardiac Risk Factors include: advanced age (>77men, >54 women) Sleep patterns: has difficulty falling asleep, feels rested on waking, does not get up to void, gets up 2 times nightly to void and sleeps 4-5 hours nightly. Patient reports insomnia issues, discussed recommended sleep tips.   Home Safety/Smoke Alarms: Feels safe in home. Smoke alarms in place.  Living environment; residence and Firearm Safety: apartment. Lives alone, no needs for DME, good support system Seat Belt Safety/Bike Helmet: Wears seat belt.     Objective:    Today's Vitals   10/29/18 1337  BP: 110/70  Pulse: 83  SpO2: 99%  Weight: 147 lb (66.7 kg)  Height: 5\' 3"  (1.6 m)   Body mass index is 26.04 kg/m.  Advanced Directives 10/29/2018 10/28/2017 07/31/2016 07/24/2016 07/22/2016 03/20/2016  Does Patient Have a Medical Advance Directive? Yes No;Yes No No Yes No  Type of Paramedic of [place];Living will [place];Living will - - Living will;Healthcare Power of Attorney -  Copy of [place] in Chart? No - copy requested - - - - -  Would patient like information on creating a medical advance directive? - - - - - No - patient declined information    Current Medications (verified) Outpatient Encounter Medications as of 10/29/2018  Medication Sig  . bifidobacterium infantis (ALIGN) capsule Take 1 capsule by mouth daily.  . cholestyramine (QUESTRAN) 4 g packet Take 1 packet (4 g total) by mouth daily.  . fluticasone (FLONASE) 50 MCG/ACT nasal spray Place 2 sprays into both nostrils daily. (Patient not taking: Reported on 10/29/2018)  . mirabegron ER (MYRBETRIQ) 50 MG TB24 tablet Take 1 tablet (50 mg total) by mouth daily.  .  [DISCONTINUED] benzonatate (TESSALON) 100 MG capsule Take 1 capsule (100 mg total) by mouth 3 (three) times daily as needed.  . [DISCONTINUED] cefdinir (OMNICEF) 300 MG capsule Take 1 capsule (300 mg total) by mouth 2 (two) times daily.  . [DISCONTINUED] cholestyramine (QUESTRAN) 4 g packet Take 1 packet (4 g total) by mouth daily.  . [DISCONTINUED] MYRBETRIQ 50 MG TB24 tablet TAKE 1 TABLET BY MOUTH DAILY.   No facility-administered encounter medications on file as of 10/29/2018.     Allergies (verified) Patient has no known allergies.   History: Past Medical History:  Diagnosis Date  . Anxiety   . C. difficile diarrhea 9/06  . Cochlear implant status   . Depression   . Diverticulosis   . Hearing loss of both ears   . HLD (hyperlipidemia)   . Hyperplastic colon polyp   . Insomnia   . Internal hemorrhoid   . Osteoarthritis, knee    Past Surgical History:  Procedure Laterality Date  . BUNIONECTOMY     right  . CHOLECYSTECTOMY N/A 07/25/2016   Procedure: LAPAROSCOPIC CHOLECYSTECTOMY;  Surgeon: Jackolyn Confer, MD;  Location: [facility];  Service: General;  Laterality: N/A;  Royann Shivers IMPLANT  July '13   right ear. Dr. Idelle Crouch  . ERCP N/A 07/31/2016   Procedure: ENDOSCOPIC RETROGRADE CHOLANGIOPANCREATOGRAPHY (ERCP);  Surgeon: Milus Banister, MD;  Location: [facility];  Service: Endoscopy;  Laterality: N/A;  . ROTATOR CUFF REPAIR Left 2012  . ROTATOR CUFF REPAIR Right 2007 & 2009  . TONSILLECTOMY    .  VAGINAL HYSTERECTOMY     Family History  Problem Relation Age of Onset  . Ovarian cancer Mother   . Transient ischemic attack Mother   . Breast cancer Maternal Aunt        aunts  . Stroke Sister   . Anxiety disorder Daughter    Social History   Socioeconomic History  . Marital status: Widowed    Spouse name: Not on file  . Number of children: 2  . Years of education: Master's  . Highest education level: Not on file  Occupational History  . Occupation: Associate Professor: RETIRED  Social Needs  . Financial resource strain: Not hard at all  . Food insecurity:    Worry: Never true    Inability: Never true  . Transportation needs:    Medical: No    Non-medical: No  Tobacco Use  . Smoking status: Former Research scientist (life sciences)  . Smokeless tobacco: Never Used  Substance and Sexual Activity  . Alcohol use: Yes    Comment: 1 glass of Keasha Malkiewicz a day  . Drug use: No  . Sexual activity: Not Currently  Lifestyle  . Physical activity:    Days per week: 0 days    Minutes per session: 0 min  . Stress: Only a little  Relationships  . Social connections:    Talks on phone: More than three times a week    Gets together: More than three times a week    Attends religious service: More than 4 times per year    Active member of club or organization: Yes    Attends meetings of clubs or organizations: More than 4 times per year    Relationship status: Widowed  Other Topics Concern  . Not on file  Social History Narrative   Patient is widowed and lives alone.   Patient has two adult children.   Patient is retired.   Patient has a Scientist, water quality.   Patient is right-handed.   Patient drinks two cups of coffee daily.    Tobacco Counseling Counseling given: Not Answered  Activities of Daily Living In your present state of health, do you have any difficulty performing the following activities: 10/29/2018  Hearing? N  Vision? N  Difficulty concentrating or making decisions? N  Walking or climbing stairs? N  Dressing or bathing? N  Doing errands, shopping? N  Preparing Food and eating ? N  Using the Toilet? N  In the past six months, have you accidently leaked urine? N  Do you have problems with loss of bowel control? N  Managing your Medications? N  Managing your Finances? N  Housekeeping or managing your Housekeeping? N  Some recent data might be hidden     Immunizations and Health Maintenance Immunization History  Administered Date(s) Administered  . H1N1  08/28/2008  . Influenza Split 09/07/2012  . Influenza Whole 09/04/2010  . Influenza, High Dose Seasonal PF 08/15/2013, 06/19/2016, 10/06/2017, 08/30/2018  . Influenza,inj,Quad PF,6+ Mos 08/10/2014  . Pneumococcal Conjugate-13 08/10/2014  . Pneumococcal Polysaccharide-23 05/24/2009, 02/19/2010  . Td 05/24/2009, 02/19/2010  . Zoster 09/25/2014   There are no preventive care reminders to display for this patient.  Patient Care Team: Hoyt Koch, MD as PCP - General (Internal Medicine)  Indicate any recent Medical Services you may have received from other than [facility] providers in the past year (date may be approximate).     Assessment:   This is a routine wellness examination for Angela Barber. Physical assessment deferred  to PCP.   Hearing/Vision screen Hearing Screening Comments: Cochlear implant right side and hearing aid left. Followed by [facility] Vision Screening Comments: appointment yearly Dr. Katy Fitch  Dietary issues and exercise activities discussed: Current Exercise Habits: The patient does not participate in regular exercise at present, Exercise limited by: orthopedic condition(s)  Diet (meal preparation, eat out, water intake, caffeinated beverages, dairy products, fruits and vegetables): in general, a "healthy" diet  , well balanced.  eats a variety of fruits and vegetables daily, limits salt, fat/cholesterol, sugar,carbohydrates,caffeine.  Encouraged patient to increase daily water and healthy fluid intake.  Goals    . Patient Stated     Lose weight by have a stronger will and stay consistent with not snacking on unhealthy foods. Increase my physical activity by going to [facility] or going to [employer].       Depression Screen PHQ 2/9 Scores 10/29/2018 03/27/2016  PHQ - 2 Score 0 0    Fall Risk Fall Risk  10/29/2018 12/11/2017 03/27/2016  Falls in the past year? 0 No No   Cognitive Function: MMSE - Mini Mental State Exam 10/29/2018  Orientation to time 5  Orientation  to Place 5  Registration 3  Attention/ Calculation 5  Recall 2  Language- name 2 objects 2  Language- repeat 1  Language- follow 3 step command 3  Language- read & follow direction 1  Write a sentence 1  Copy design 1  Total score 29        Screening Tests Health Maintenance  Topic Date Due  . TETANUS/TDAP  02/20/2020  . INFLUENZA VACCINE  Completed  . DEXA SCAN  Completed  . PNA vac Low Risk Adult  Completed     Plan:    Reviewed health maintenance screenings with patient today and relevant education, vaccines, and/or referrals were provided.   Continue doing brain stimulating activities (puzzles, reading, adult coloring books, staying active) to keep memory sharp.   Continue to eat heart healthy diet (full of fruits, vegetables, whole grains, lean protein, water--limit salt, fat, and sugar intake) and increase physical activity as tolerated.  I have personally reviewed and noted the following in the patient's chart:   . Medical and social history . Use of alcohol, tobacco or illicit drugs  . Current medications and supplements . Functional ability and status . Nutritional status . Physical activity . Advanced directives . List of other physicians . Vitals . Screenings to include cognitive, depression, and falls . Referrals and appointments  In addition, I have reviewed and discussed with patient certain preventive protocols, quality metrics, and best practice recommendations. A written personalized care plan for preventive services as well as general preventive health recommendations were provided to patient.     Michiel Cowboy, RN   10/29/2018

## 2018-10-29 ENCOUNTER — Ambulatory Visit (INDEPENDENT_AMBULATORY_CARE_PROVIDER_SITE_OTHER): Payer: Medicare Other | Admitting: Internal Medicine

## 2018-10-29 ENCOUNTER — Encounter: Payer: Self-pay | Admitting: Internal Medicine

## 2018-10-29 ENCOUNTER — Ambulatory Visit (INDEPENDENT_AMBULATORY_CARE_PROVIDER_SITE_OTHER): Payer: Medicare Other | Admitting: *Deleted

## 2018-10-29 ENCOUNTER — Other Ambulatory Visit (INDEPENDENT_AMBULATORY_CARE_PROVIDER_SITE_OTHER): Payer: Medicare Other

## 2018-10-29 VITALS — BP 110/70 | HR 83 | Ht 63.0 in | Wt 147.0 lb

## 2018-10-29 VITALS — BP 110/70 | HR 83 | Temp 97.5°F | Ht 63.0 in | Wt 147.0 lb

## 2018-10-29 DIAGNOSIS — H9193 Unspecified hearing loss, bilateral: Secondary | ICD-10-CM | POA: Diagnosis not present

## 2018-10-29 DIAGNOSIS — Z0000 Encounter for general adult medical examination without abnormal findings: Secondary | ICD-10-CM

## 2018-10-29 DIAGNOSIS — N3946 Mixed incontinence: Secondary | ICD-10-CM | POA: Diagnosis not present

## 2018-10-29 LAB — COMPREHENSIVE METABOLIC PANEL
ALT: 12 U/L (ref 0–35)
AST: 16 U/L (ref 0–37)
Albumin: 4.4 g/dL (ref 3.5–5.2)
Alkaline Phosphatase: 62 U/L (ref 39–117)
BILIRUBIN TOTAL: 0.8 mg/dL (ref 0.2–1.2)
BUN: 15 mg/dL (ref 6–23)
CO2: 29 mEq/L (ref 19–32)
CREATININE: 0.75 mg/dL (ref 0.40–1.20)
Calcium: 9.7 mg/dL (ref 8.4–10.5)
Chloride: 103 mEq/L (ref 96–112)
GFR: 74.2 mL/min (ref >60.00)
Glucose, Bld: 85 mg/dL (ref 70–99)
Potassium: 5 mEq/L (ref 3.5–5.1)
Sodium: 141 mEq/L (ref 135–145)
Total Protein: 7 g/dL (ref 6.0–8.3)

## 2018-10-29 LAB — CBC
HCT: 44.7 % (ref 36.0–46.0)
Hemoglobin: 15.4 g/dL — ABNORMAL HIGH (ref 12.0–15.0)
MCHC: 34.6 g/dL (ref 30.0–36.0)
MCV: 90 fl (ref 78.0–100.0)
Platelets: 285 10*3/uL (ref 150.0–400.0)
RBC: 4.96 Mil/uL (ref 3.87–5.11)
RDW: 13.8 % (ref 11.5–15.5)
WBC: 9.2 10*3/uL (ref 4.0–10.5)

## 2018-10-29 LAB — URINALYSIS, ROUTINE W REFLEX MICROSCOPIC
BILIRUBIN URINE: NEGATIVE
KETONES UR: NEGATIVE
Nitrite: NEGATIVE
Specific Gravity, Urine: 1.025 (ref 1.000–1.030)
Total Protein, Urine: NEGATIVE
Urine Glucose: NEGATIVE
Urobilinogen, UA: 0.2 (ref 0.0–1.0)
pH: 5.5 (ref 5.0–8.0)

## 2018-10-29 LAB — LIPID PANEL
CHOLESTEROL: 202 mg/dL — AB (ref 0–200)
HDL: 45 mg/dL (ref >39.00)
LDL Cholesterol: 126 mg/dL — ABNORMAL HIGH (ref 0–99)
NonHDL: 157.43
TRIGLYCERIDES: 156 mg/dL — AB (ref 0.0–149.0)
Total CHOL/HDL Ratio: 4
VLDL: 31.2 mg/dL (ref 0.0–40.0)

## 2018-10-29 MED ORDER — ESTRADIOL 0.1 MG/GM VA CREA: 1.0000 | TOPICAL_CREAM | VAGINAL | 12 refills | Status: DC

## 2018-10-29 MED ORDER — MIRABEGRON ER 50 MG PO TB24: 50.0000 mg | ORAL_TABLET | Freq: Every day | ORAL | 3 refills | Status: DC

## 2018-10-29 MED ORDER — CHOLESTYRAMINE 4 G PO PACK: 4.0000 g | PACK | Freq: Every day | ORAL | 3 refills | Status: DC

## 2018-10-29 NOTE — Assessment & Plan Note (Signed)
Stable at this time moderate at least.

## 2018-10-29 NOTE — Patient Instructions (Signed)
We have sent in the estrogen cream to use 3 times a week to help.   We have sent in the refills of the packet.   Health Maintenance, Female Adopting a healthy lifestyle and getting preventive care can go a long way to promote health and wellness. Talk with your health care provider about what schedule of regular examinations is right for you. This is a good chance for you to check in with your provider about disease prevention and staying healthy. In between checkups, there are plenty of things you can do on your own. Experts have done a lot of research about which lifestyle changes and preventive measures are most likely to keep you healthy. Ask your health care provider for more information. Weight and diet Eat a healthy diet  Be sure to include plenty of vegetables, fruits, low-fat dairy products, and lean protein.  Do not eat a lot of foods high in solid fats, added sugars, or salt.  Get regular exercise. This is one of the most important things you can do for your health. ? Most adults should exercise for at least 150 minutes each week. The exercise should increase your heart rate and make you sweat (moderate-intensity exercise). ? Most adults should also do strengthening exercises at least twice a week. This is in addition to the moderate-intensity exercise. Maintain a healthy weight  Body mass index (BMI) is a measurement that can be used to identify possible weight problems. It estimates body fat based on height and weight. Your health care provider can help determine your BMI and help you achieve or maintain a healthy weight.  For females 77 years of age and older: ? A BMI below 18.5 is considered underweight. ? A BMI of 18.5 to 24.9 is normal. ? A BMI of 25 to 29.9 is considered overweight. ? A BMI of 30 and above is considered obese. Watch levels of cholesterol and blood lipids  You should start having your blood tested for lipids and cholesterol at 81 years of age, then have  this test every 5 years.  You may need to have your cholesterol levels checked more often if: ? Your lipid or cholesterol levels are high. ? You are older than 81 years of age. ? You are at high risk for heart disease. Cancer screening Lung Cancer  Lung cancer screening is recommended for adults 29-43 years old who are at high risk for lung cancer because of a history of smoking.  A yearly low-dose CT scan of the lungs is recommended for people who: ? Currently smoke. ? Have quit within the past 15 years. ? Have at least a 30-pack-year history of smoking. A pack year is smoking an average of one pack of cigarettes a day for 1 year.  Yearly screening should continue until it has been 15 years since you quit.  Yearly screening should stop if you develop a health problem that would prevent you from having lung cancer treatment. Breast Cancer  Practice breast self-awareness. This means understanding how your breasts normally appear and feel.  It also means doing regular breast self-exams. Let your health care provider know about any changes, no matter how small.  If you are in your 20s or 30s, you should have a clinical breast exam (CBE) by a health care provider every 1-3 years as part of a regular health exam.  If you are 56 or older, have a CBE every year. Also consider having a breast X-ray (mammogram) every year.  If  you have a family history of breast cancer, talk to your health care provider about genetic screening.  If you are at high risk for breast cancer, talk to your health care provider about having an MRI and a mammogram every year.  Breast cancer gene (BRCA) assessment is recommended for women who have family members with BRCA-related cancers. BRCA-related cancers include: ? Breast. ? Ovarian. ? Tubal. ? Peritoneal cancers.  Results of the assessment will determine the need for genetic counseling and BRCA1 and BRCA2 testing. Cervical Cancer Your health care  provider may recommend that you be screened regularly for cancer of the pelvic organs (ovaries, uterus, and vagina). This screening involves a pelvic examination, including checking for microscopic changes to the surface of your cervix (Pap test). You may be encouraged to have this screening done every 3 years, beginning at age 24.  For women ages 37-65, health care providers may recommend pelvic exams and Pap testing every 3 years, or they may recommend the Pap and pelvic exam, combined with testing for human papilloma virus (HPV), every 5 years. Some types of HPV increase your risk of cervical cancer. Testing for HPV may also be done on women of any age with unclear Pap test results.  Other health care providers may not recommend any screening for nonpregnant women who are considered low risk for pelvic cancer and who do not have symptoms. Ask your health care provider if a screening pelvic exam is right for you.  If you have had past treatment for cervical cancer or a condition that could lead to cancer, you need Pap tests and screening for cancer for at least 20 years after your treatment. If Pap tests have been discontinued, your risk factors (such as having a new sexual partner) need to be reassessed to determine if screening should resume. Some women have medical problems that increase the chance of getting cervical cancer. In these cases, your health care provider may recommend more frequent screening and Pap tests. Colorectal Cancer  This type of cancer can be detected and often prevented.  Routine colorectal cancer screening usually begins at 81 years of age and continues through 81 years of age.  Your health care provider may recommend screening at an earlier age if you have risk factors for colon cancer.  Your health care provider may also recommend using home test kits to check for hidden blood in the stool.  A small camera at the end of a tube can be used to examine your colon directly  (sigmoidoscopy or colonoscopy). This is done to check for the earliest forms of colorectal cancer.  Routine screening usually begins at age 24.  Direct examination of the colon should be repeated every 5-10 years through 81 years of age. However, you may need to be screened more often if early forms of precancerous polyps or small growths are found. Skin Cancer  Check your skin from head to toe regularly.  Tell your health care provider about any new moles or changes in moles, especially if there is a change in a mole's shape or color.  Also tell your health care provider if you have a mole that is larger than the size of a pencil eraser.  Always use sunscreen. Apply sunscreen liberally and repeatedly throughout the day.  Protect yourself by wearing long sleeves, pants, a wide-brimmed hat, and sunglasses whenever you are outside. Heart disease, diabetes, and high blood pressure  High blood pressure causes heart disease and increases the risk of stroke.  High blood pressure is more likely to develop in: ? People who have blood pressure in the high end of the normal range (130-139/85-89 mm Hg). ? People who are overweight or obese. ? People who are African American.  If you are 74-51 years of age, have your blood pressure checked every 3-5 years. If you are 67 years of age or older, have your blood pressure checked every year. You should have your blood pressure measured twice-once when you are at a hospital or clinic, and once when you are not at a hospital or clinic. Record the average of the two measurements. To check your blood pressure when you are not at a hospital or clinic, you can use: ? An automated blood pressure machine at a pharmacy. ? A home blood pressure monitor.  If you are between 53 years and 70 years old, ask your health care provider if you should take aspirin to prevent strokes.  Have regular diabetes screenings. This involves taking a blood sample to check your  fasting blood sugar level. ? If you are at a normal weight and have a low risk for diabetes, have this test once every three years after 81 years of age. ? If you are overweight and have a high risk for diabetes, consider being tested at a younger age or more often. Preventing infection Hepatitis B  If you have a higher risk for hepatitis B, you should be screened for this virus. You are considered at high risk for hepatitis B if: ? You were born in a country where hepatitis B is common. Ask your health care provider which countries are considered high risk. ? Your parents were born in a high-risk country, and you have not been immunized against hepatitis B (hepatitis B vaccine). ? You have HIV or AIDS. ? You use needles to inject street drugs. ? You live with someone who has hepatitis B. ? You have had sex with someone who has hepatitis B. ? You get hemodialysis treatment. ? You take certain medicines for conditions, including cancer, organ transplantation, and autoimmune conditions. Hepatitis C  Blood testing is recommended for: ? Everyone born from [birth year] through 1963. ? Anyone with known risk factors for hepatitis C. Sexually transmitted infections (STIs)  You should be screened for sexually transmitted infections (STIs) including gonorrhea and chlamydia if: ? You are sexually active and are younger than 81 years of age. ? You are older than 81 years of age and your health care provider tells you that you are at risk for this type of infection. ? Your sexual activity has changed since you were last screened and you are at an increased risk for chlamydia or gonorrhea. Ask your health care provider if you are at risk.  If you do not have HIV, but are at risk, it may be recommended that you take a prescription medicine daily to prevent HIV infection. This is called pre-exposure prophylaxis (PrEP). You are considered at risk if: ? You are sexually active and do not regularly use condoms or  know the HIV status of your partner(s). ? You take drugs by injection. ? You are sexually active with a partner who has HIV. Talk with your health care provider about whether you are at high risk of being infected with HIV. If you choose to begin PrEP, you should first be tested for HIV. You should then be tested every 3 months for as long as you are taking PrEP. Pregnancy  If you are premenopausal and  you may become pregnant, ask your health care provider about preconception counseling.  If you may become pregnant, take 400 to 800 micrograms (mcg) of folic acid every day.  If you want to prevent pregnancy, talk to your health care provider about birth control (contraception). Osteoporosis and menopause  Osteoporosis is a disease in which the bones lose minerals and strength with aging. This can result in serious bone fractures. Your risk for osteoporosis can be identified using a bone density scan.  If you are 16 years of age or older, or if you are at risk for osteoporosis and fractures, ask your health care provider if you should be screened.  Ask your health care provider whether you should take a calcium or vitamin D supplement to lower your risk for osteoporosis.  Menopause may have certain physical symptoms and risks.  Hormone replacement therapy may reduce some of these symptoms and risks. Talk to your health care provider about whether hormone replacement therapy is right for you. Follow these instructions at home:  Schedule regular health, dental, and eye exams.  Stay current with your immunizations.  Do not use any tobacco products including cigarettes, chewing tobacco, or electronic cigarettes.  If you are pregnant, do not drink alcohol.  If you are breastfeeding, limit how much and how often you drink alcohol.  Limit alcohol intake to no more than 1 drink per day for nonpregnant women. One drink equals 12 ounces of beer, 5 ounces of wine, or 1 ounces of hard  liquor.  Do not use street drugs.  Do not share needles.  Ask your health care provider for help if you need support or information about quitting drugs.  Tell your health care provider if you often feel depressed.  Tell your health care provider if you have ever been abused or do not feel safe at home. This information is not intended to replace advice given to you by your health care provider. Make sure you discuss any questions you have with your health care provider. Document Released: 03/24/2011 Document Revised: 02/14/2016 Document Reviewed: 06/12/2015 Elsevier Interactive Patient Education  2019 [employer].

## 2018-10-29 NOTE — Progress Notes (Signed)
Medical screening examination/treatment/procedure(s) were performed by non-physician practitioner and as supervising physician I was immediately available for consultation/collaboration. I agree with above. Elizabeth A Crawford, MD 

## 2018-10-29 NOTE — Assessment & Plan Note (Signed)
Flu shot up to date. Pneumonia complete. Shingrix counseled. Tetanus up to date. Colonoscopy aged out. Mammogram aged out, pap smear aged out and dexa up to date. Counseled about sun safety and mole surveillance. Counseled about the dangers of distracted driving. Given 10 year screening recommendations.  

## 2018-10-29 NOTE — Progress Notes (Signed)
   Subjective:   Patient ID: Angela Barber, female    DOB: [birth year], 83 y.o.   MRN: [id number]  HPI The patient is an 83 YO female coming in for physical.   PMH, [place], social history reviewed and updated  Review of Systems  Constitutional: Negative.   HENT: Negative.   Eyes: Negative.   Respiratory: Negative for cough, chest tightness and shortness of breath.   Cardiovascular: Negative for chest pain, palpitations and leg swelling.  Gastrointestinal: Negative for abdominal distention, abdominal pain, constipation, diarrhea, nausea and vomiting.  Musculoskeletal: Negative.   Skin: Negative.   Neurological: Negative.   Psychiatric/Behavioral: Negative.     Objective:  Physical Exam Constitutional:      Appearance: She is well-developed.  HENT:     Head: Normocephalic and atraumatic.  Neck:     Musculoskeletal: Normal range of motion.  Cardiovascular:     Rate and Rhythm: Normal rate and regular rhythm.  Pulmonary:     Effort: Pulmonary effort is normal. No respiratory distress.     Breath sounds: Normal breath sounds. No wheezing or rales.  Abdominal:     General: Bowel sounds are normal. There is no distension.     Palpations: Abdomen is soft.     Tenderness: There is no abdominal tenderness. There is no rebound.  Skin:    General: Skin is warm and dry.  Neurological:     Mental Status: She is alert and oriented to person, place, and time.     Coordination: Coordination normal.     Vitals:   10/29/18 1337  BP: 110/70  Pulse: 83  Temp: (!) 97.5 F (36.4 C)  TempSrc: Oral  SpO2: 99%  Weight: 147 lb (66.7 kg)  Height: 5\' 3"  (1.6 m)    Assessment & Plan:

## 2018-10-29 NOTE — Patient Instructions (Addendum)
Continue doing brain stimulating activities (puzzles, reading, adult coloring books, staying active) to keep memory sharp.   Continue to eat heart healthy diet (full of fruits, vegetables, whole grains, lean protein, water--limit salt, fat, and sugar intake) and increase physical activity as tolerated.   Angela Barber , Thank you for taking time to come for your Medicare Wellness Visit. I appreciate your ongoing commitment to your health goals. Please review the following plan we discussed and let me know if I can assist you in the future.   These are the goals we discussed: Goals    . Patient Stated     Lose weight by have a stronger will and stay consistent with not snacking on unhealthy foods. Increase my physical activity by going to [facility] or going to [employer].        This is a list of the screening recommended for you and due dates:  Health Maintenance  Topic Date Due  . Tetanus Vaccine  02/20/2020  . Flu Shot  Completed  . DEXA scan (bone density measurement)  Completed  . Pneumonia vaccines  Completed   Health Maintenance, Female Adopting a healthy lifestyle and getting preventive care can go a long way to promote health and wellness. Talk with your health care provider about what schedule of regular examinations is right for you. This is a good chance for you to check in with your provider about disease prevention and staying healthy. In between checkups, there are plenty of things you can do on your own. Experts have done a lot of research about which lifestyle changes and preventive measures are most likely to keep you healthy. Ask your health care provider for more information. Weight and diet Eat a healthy diet  Be sure to include plenty of vegetables, fruits, low-fat dairy products, and lean protein.  Do not eat a lot of foods high in solid fats, added sugars, or salt.  Get regular exercise. This is one of the most important things you can do for your health. ? Most adults  should exercise for at least 150 minutes each week. The exercise should increase your heart rate and make you sweat (moderate-intensity exercise). ? Most adults should also do strengthening exercises at least twice a week. This is in addition to the moderate-intensity exercise. Maintain a healthy weight  Body mass index (BMI) is a measurement that can be used to identify possible weight problems. It estimates body fat based on height and weight. Your health care provider can help determine your BMI and help you achieve or maintain a healthy weight.  For females 28 years of age and older: ? A BMI below 18.5 is considered underweight. ? A BMI of 18.5 to 24.9 is normal. ? A BMI of 25 to 29.9 is considered overweight. ? A BMI of 30 and above is considered obese. Watch levels of cholesterol and blood lipids  You should start having your blood tested for lipids and cholesterol at 81 years of age, then have this test every 5 years.  You may need to have your cholesterol levels checked more often if: ? Your lipid or cholesterol levels are high. ? You are older than 81 years of age. ? You are at high risk for heart disease. Cancer screening Lung Cancer  Lung cancer screening is recommended for adults 21-23 years old who are at high risk for lung cancer because of a history of smoking.  A yearly low-dose CT scan of the lungs is recommended for  people who: ? Currently smoke. ? Have quit within the past 15 years. ? Have at least a 30-pack-year history of smoking. A pack year is smoking an average of one pack of cigarettes a day for 1 year.  Yearly screening should continue until it has been 15 years since you quit.  Yearly screening should stop if you develop a health problem that would prevent you from having lung cancer treatment. Breast Cancer  Practice breast self-awareness. This means understanding how your breasts normally appear and feel.  It also means doing regular breast self-exams.  Let your health care provider know about any changes, no matter how small.  If you are in your 20s or 30s, you should have a clinical breast exam (CBE) by a health care provider every 1-3 years as part of a regular health exam.  If you are 66 or older, have a CBE every year. Also consider having a breast X-ray (mammogram) every year.  If you have a family history of breast cancer, talk to your health care provider about genetic screening.  If you are at high risk for breast cancer, talk to your health care provider about having an MRI and a mammogram every year.  Breast cancer gene (BRCA) assessment is recommended for women who have family members with BRCA-related cancers. BRCA-related cancers include: ? Breast. ? Ovarian. ? Tubal. ? Peritoneal cancers.  Results of the assessment will determine the need for genetic counseling and BRCA1 and BRCA2 testing. Cervical Cancer Your health care provider may recommend that you be screened regularly for cancer of the pelvic organs (ovaries, uterus, and vagina). This screening involves a pelvic examination, including checking for microscopic changes to the surface of your cervix (Pap test). You may be encouraged to have this screening done every 3 years, beginning at age 40.  For women ages 36-65, health care providers may recommend pelvic exams and Pap testing every 3 years, or they may recommend the Pap and pelvic exam, combined with testing for human papilloma virus (HPV), every 5 years. Some types of HPV increase your risk of cervical cancer. Testing for HPV may also be done on women of any age with unclear Pap test results.  Other health care providers may not recommend any screening for nonpregnant women who are considered low risk for pelvic cancer and who do not have symptoms. Ask your health care provider if a screening pelvic exam is right for you.  If you have had past treatment for cervical cancer or a condition that could lead to cancer,  you need Pap tests and screening for cancer for at least 20 years after your treatment. If Pap tests have been discontinued, your risk factors (such as having a new sexual partner) need to be reassessed to determine if screening should resume. Some women have medical problems that increase the chance of getting cervical cancer. In these cases, your health care provider may recommend more frequent screening and Pap tests. Colorectal Cancer  This type of cancer can be detected and often prevented.  Routine colorectal cancer screening usually begins at 81 years of age and continues through 81 years of age.  Your health care provider may recommend screening at an earlier age if you have risk factors for colon cancer.  Your health care provider may also recommend using home test kits to check for hidden blood in the stool.  A small camera at the end of a tube can be used to examine your colon directly (sigmoidoscopy or colonoscopy).  This is done to check for the earliest forms of colorectal cancer.  Routine screening usually begins at age 51.  Direct examination of the colon should be repeated every 5-10 years through 81 years of age. However, you may need to be screened more often if early forms of precancerous polyps or small growths are found. Skin Cancer  Check your skin from head to toe regularly.  Tell your health care provider about any new moles or changes in moles, especially if there is a change in a mole's shape or color.  Also tell your health care provider if you have a mole that is larger than the size of a pencil eraser.  Always use sunscreen. Apply sunscreen liberally and repeatedly throughout the day.  Protect yourself by wearing long sleeves, pants, a wide-brimmed hat, and sunglasses whenever you are outside. Heart disease, diabetes, and high blood pressure  High blood pressure causes heart disease and increases the risk of stroke. High blood pressure is more likely to  develop in: ? People who have blood pressure in the high end of the normal range (130-139/85-89 mm Hg). ? People who are overweight or obese. ? People who are African American.  If you are 69-27 years of age, have your blood pressure checked every 3-5 years. If you are 66 years of age or older, have your blood pressure checked every year. You should have your blood pressure measured twice-once when you are at a hospital or clinic, and once when you are not at a hospital or clinic. Record the average of the two measurements. To check your blood pressure when you are not at a hospital or clinic, you can use: ? An automated blood pressure machine at a pharmacy. ? A home blood pressure monitor.  If you are between 59 years and 43 years old, ask your health care provider if you should take aspirin to prevent strokes.  Have regular diabetes screenings. This involves taking a blood sample to check your fasting blood sugar level. ? If you are at a normal weight and have a low risk for diabetes, have this test once every three years after 81 years of age. ? If you are overweight and have a high risk for diabetes, consider being tested at a younger age or more often. Preventing infection Hepatitis B  If you have a higher risk for hepatitis B, you should be screened for this virus. You are considered at high risk for hepatitis B if: ? You were born in a country where hepatitis B is common. Ask your health care provider which countries are considered high risk. ? Your parents were born in a high-risk country, and you have not been immunized against hepatitis B (hepatitis B vaccine). ? You have HIV or AIDS. ? You use needles to inject street drugs. ? You live with someone who has hepatitis B. ? You have had sex with someone who has hepatitis B. ? You get hemodialysis treatment. ? You take certain medicines for conditions, including cancer, organ transplantation, and autoimmune conditions. Hepatitis  C  Blood testing is recommended for: ? Everyone born from [birth year] through 1963. ? Anyone with known risk factors for hepatitis C. Sexually transmitted infections (STIs)  You should be screened for sexually transmitted infections (STIs) including gonorrhea and chlamydia if: ? You are sexually active and are younger than 81 years of age. ? You are older than 81 years of age and your health care provider tells you that you are at risk  for this type of infection. ? Your sexual activity has changed since you were last screened and you are at an increased risk for chlamydia or gonorrhea. Ask your health care provider if you are at risk.  If you do not have HIV, but are at risk, it may be recommended that you take a prescription medicine daily to prevent HIV infection. This is called pre-exposure prophylaxis (PrEP). You are considered at risk if: ? You are sexually active and do not regularly use condoms or know the HIV status of your partner(s). ? You take drugs by injection. ? You are sexually active with a partner who has HIV. Talk with your health care provider about whether you are at high risk of being infected with HIV. If you choose to begin PrEP, you should first be tested for HIV. You should then be tested every 3 months for as long as you are taking PrEP. Pregnancy  If you are premenopausal and you may become pregnant, ask your health care provider about preconception counseling.  If you may become pregnant, take 400 to 800 micrograms (mcg) of folic acid every day.  If you want to prevent pregnancy, talk to your health care provider about birth control (contraception). Osteoporosis and menopause  Osteoporosis is a disease in which the bones lose minerals and strength with aging. This can result in serious bone fractures. Your risk for osteoporosis can be identified using a bone density scan.  If you are 73 years of age or older, or if you are at risk for osteoporosis and fractures, ask  your health care provider if you should be screened.  Ask your health care provider whether you should take a calcium or vitamin D supplement to lower your risk for osteoporosis.  Menopause may have certain physical symptoms and risks.  Hormone replacement therapy may reduce some of these symptoms and risks. Talk to your health care provider about whether hormone replacement therapy is right for you. Follow these instructions at home:  Schedule regular health, dental, and eye exams.  Stay current with your immunizations.  Do not use any tobacco products including cigarettes, chewing tobacco, or electronic cigarettes.  If you are pregnant, do not drink alcohol.  If you are breastfeeding, limit how much and how often you drink alcohol.  Limit alcohol intake to no more than 1 drink per day for nonpregnant women. One drink equals 12 ounces of beer, 5 ounces of Angela Barber, or 1 ounces of hard liquor.  Do not use street drugs.  Do not share needles.  Ask your health care provider for help if you need support or information about quitting drugs.  Tell your health care provider if you often feel depressed.  Tell your health care provider if you have ever been abused or do not feel safe at home. This information is not intended to replace advice given to you by your health care provider. Make sure you discuss any questions you have with your health care provider. Document Released: 03/24/2011 Document Revised: 02/14/2016 Document Reviewed: 06/12/2015 Elsevier Interactive Patient Education  2019 [employer].

## 2018-10-29 NOTE — Assessment & Plan Note (Signed)
Refill [email or web address] which is helpful to her.

## 2019-05-04 ENCOUNTER — Ambulatory Visit (INDEPENDENT_AMBULATORY_CARE_PROVIDER_SITE_OTHER): Payer: Medicare Other | Admitting: Physician Assistant

## 2019-05-04 ENCOUNTER — Other Ambulatory Visit (INDEPENDENT_AMBULATORY_CARE_PROVIDER_SITE_OTHER): Payer: Medicare Other

## 2019-05-04 ENCOUNTER — Encounter: Payer: Self-pay | Admitting: Physician Assistant

## 2019-05-04 VITALS — BP 86/62 | HR 88 | Temp 98.2°F | Ht 63.0 in | Wt 148.8 lb

## 2019-05-04 DIAGNOSIS — K529 Noninfective gastroenteritis and colitis, unspecified: Secondary | ICD-10-CM

## 2019-05-04 DIAGNOSIS — K625 Hemorrhage of anus and rectum: Secondary | ICD-10-CM

## 2019-05-04 LAB — CBC WITH DIFFERENTIAL/PLATELET
Basophils Absolute: 0.1 10*3/uL (ref 0.0–0.1)
Basophils Relative: 0.8 % (ref 0.0–3.0)
Eosinophils Absolute: 0.2 10*3/uL (ref 0.0–0.7)
Eosinophils Relative: 2 % (ref 0.0–5.0)
HCT: 45.4 % (ref 36.0–46.0)
Hemoglobin: 15.5 g/dL — ABNORMAL HIGH (ref 12.0–15.0)
Lymphocytes Relative: 42.2 % (ref 12.0–46.0)
Lymphs Abs: 3.2 10*3/uL (ref 0.7–4.0)
MCHC: 34.1 g/dL (ref 30.0–36.0)
MCV: 91.9 fl (ref 78.0–100.0)
Monocytes Absolute: 0.7 10*3/uL (ref 0.1–1.0)
Monocytes Relative: 9.2 % (ref 3.0–12.0)
Neutro Abs: 3.5 10*3/uL (ref 1.4–7.7)
Neutrophils Relative %: 45.8 % (ref 43.0–77.0)
Platelets: 287 10*3/uL (ref 150.0–400.0)
RBC: 4.94 Mil/uL (ref 3.87–5.11)
RDW: 13.6 % (ref 11.5–15.5)
WBC: 7.7 10*3/uL (ref 4.0–10.5)

## 2019-05-04 LAB — SEDIMENTATION RATE: Sed Rate: 6 mm/hr (ref 0–30)

## 2019-05-04 MED ORDER — CHOLESTYRAMINE 4 G PO PACK: 4.0000 g | PACK | Freq: Every day | ORAL | 12 refills | Status: DC

## 2019-05-04 NOTE — Patient Instructions (Signed)
Please purchase the following medications over the counter and take as directed: Allign daily Imodium every morning before breakfast Gas-X before lunch and dinner  Please review low gas diet hand out  We have sent the following medications to your pharmacy for you to pick up at your convenience: [facility] provider has requested that you go to the basement level for lab work before leaving today. Press "B" on the elevator. The lab is located at the first door on the left as you exit the elevator.  Please call in 2 weeks to report how you are feeling  Thank you for entrusting me with your care and choosing [facility] ([place]).  Amy ESterwood PA

## 2019-05-04 NOTE — Progress Notes (Signed)
Subjective:    Patient ID: Angela Barber, female    DOB: 1935/12/25, 83 y.o.   MRN: [id number]  HPI Angela Barber is a pleasant 83 year old female, known to Dr. Ardis Hughs, who comes in today with complaints of persistent diarrhea.  Patient has remote history of C. difficile colitis, chronic anxiety and depression, diverticulosis and adenomatous colon polyps.  She developed acute cholecystitis in 0272, complicated by bile leak.  She has had problems with persistent diarrhea since the cholecystectomy felt to be bile salt induced. Last colonoscopy February 2019 with finding of multiple diverticuli, there were 2 small polyps removed each 2 to 3 mm in size and path showed these to be tubular adenomas. Patient has been on Questran 4 g daily over the past couple of years. She says she continues to take Questran daily, but does have some difficulty getting it down and gives herself a break about once a week.  She has never tried twice daily dosing and is not sure that she could do it. She called for this appointment a couple of weeks ago when she was having some increase in looser stools, and had also noted some bright red blood on the tissue and perhaps in the stool on 1 or 2 occasions.  She has not seen any blood over the past week.  She has no associated anal rectal discomfort or hemorrhoidal symptoms.  She had no associated abdominal pain or cramping around that time. She says most days she has 2-3 loose stools per day, no overt diarrhea and occasionally has a formed bowel movement. No recent antibiotics or changes in medications.  Her appetite has been fine, no nausea or vomiting and again no abdominal discomfort.  She does mention that she has a lot of gas chronically. She does use artificial sweeteners in the form of Stevia, and says she eats a lot of raw vegetables etc.  Review of Systems Pertinent positive and negative review of systems were noted in the above HPI section.  All other review of systems was  otherwise negative.  Outpatient Encounter Medications as of 05/04/2019  Medication Sig  . bifidobacterium infantis (ALIGN) capsule Take 1 capsule by mouth daily as needed.   . cholestyramine (QUESTRAN) 4 g packet Take 1 packet (4 g total) by mouth daily before lunch. Take before lunch  . fluticasone (FLONASE) 50 MCG/ACT nasal spray Place 2 sprays into both nostrils daily. (Patient taking differently: Place 2 sprays into both nostrils daily as needed. )  . mirabegron ER (MYRBETRIQ) 50 MG TB24 tablet Take 1 tablet (50 mg total) by mouth daily.  . [DISCONTINUED] cholestyramine (QUESTRAN) 4 g packet Take 1 packet (4 g total) by mouth daily.  . [DISCONTINUED] estradiol (ESTRACE VAGINAL) 0.1 MG/GM vaginal cream Place 1 Applicatorful vaginally 3 (three) times a week.   No facility-administered encounter medications on file as of 05/04/2019.    Allergies  Allergen Reactions  . Other     Egg plant   Patient Active Problem List   Diagnosis Date Noted  . Urinary incontinence 10/14/2017  . Irregular heart beat 08/09/2016  . Degenerative arthritis of right knee 06/04/2016  . Degenerative arthritis of left knee 04/10/2016  . Symptomatic varicose veins of both lower extremities 07/25/2015  . Routine general medical examination at a health care facility 08/13/2014  . 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 Adjustment disorder with mixed anxiety and depressed mood 05/19/2013  . Hyperlipidemia 05/24/2009  . Hearing loss 04/25/2008  . CLOSTRIDIUM DIFFICILE COLITIS,  HX OF 10/23/2007   Social History   Socioeconomic History  . Marital status: Widowed    Spouse name: Not on file  . Number of children: 2  . Years of education: Master's  . Highest education level: Not on file  Occupational History  . Occupation: Product manager: RETIRED  Social Needs  . Financial resource strain: Not hard at all  . Food insecurity    Worry: Never true    Inability: Never true  . Transportation  needs    Medical: No    Non-medical: No  Tobacco Use  . Smoking status: Former Research scientist (life sciences)  . Smokeless tobacco: Never Used  Substance and Sexual Activity  . Alcohol use: Yes    Comment: 1 glass of wine a day  . Drug use: No  . Sexual activity: Not Currently  Lifestyle  . Physical activity    Days per week: 0 days    Minutes per session: 0 min  . Stress: Only a little  Relationships  . Social connections    Talks on phone: More than three times a week    Gets together: More than three times a week    Attends religious service: More than 4 times per year    Active member of club or organization: Yes    Attends meetings of clubs or organizations: More than 4 times per year    Relationship status: Widowed  . Intimate partner violence    Fear of current or ex partner: Not on file    Emotionally abused: Not on file    Physically abused: Not on file    Forced sexual activity: Not on file  Other Topics Concern  . Not on file  Social History Narrative   Patient is widowed and lives alone.   Patient has two adult children.   Patient is retired.   Patient has a Scientist, water quality.   Patient is right-handed.   Patient drinks two cups of coffee daily.    Angela Barber family history includes Anxiety disorder in her daughter; Autoimmune disease in her daughter; Breast cancer in her maternal aunt; Ovarian cancer in her mother; Stroke in her sister; Transient ischemic attack in her mother.      Objective:    Vitals:   05/04/19 1009  BP: (!) 86/62  Pulse: 88  Temp: 98.2 F (36.8 C)    Physical Exam Well-developed well-nourished elderly white female in no acute distress.   Weight, 148 BMI 26.3  hard of hearing  HEENT; nontraumatic normocephalic, EOMI, PE RR LA, sclera anicteric. Oropharynx; not examined/wearing mass/COVID Neck; supple, no JVD Cardiovascular; regular rate and rhythm with S1-S2, no murmur rub or gallop Pulmonary; Clear bilaterally Abdomen; soft, nontender, nondistended,  no palpable mass or hepatosplenomegaly, bowel sounds are active Rectal; not done today Skin; benign exam, no jaundice rash or appreciable lesions Extremities; no clubbing cyanosis or edema skin warm and dry Neuro/Psych; alert and oriented x4, grossly nonfocal mood and affect appropriate hard of hearing .       Assessment & Plan:   #79 83 year old white female with chronic diarrhea felt consistent with bile salt induced diarrhea postcholecystectomy. Patient has had some recent increase in loose stool within the past couple of weeks, now back to her baseline.  Very occasional incontinence, frequent excessive gas  Doubt superimposed infectious process  #2 remote history of C. Difficile #3 diverticulosis 4.  History of adenomatous colon polyps-up-to-date with colonoscopies #5 status post cholecystectomy located by bile leak  2017  Plan; continue Questran 4 g in 6 ounces of fluid daily.  Patient advised to take this at least 2 hours away from other medications, and suggested she change her dosing to [facility] lunch.  Start Imodium 1 p.o. every morning AC breakfast Trial of Gas-X with lunch and dinner PRN Restart align 1 p.o. daily Patient was given a copy of a low gas diet to review high gas producing foods and perhaps eliminate or decrease intake Check CBC with differential today Patient is asked to call should she have any further episodes of rectal bleeding.  She will call in 2 to 3 weeks with a progress report.     Genia Harold PA-C 05/04/2019   Cc: Hoyt Koch, *

## 2019-05-05 NOTE — Progress Notes (Signed)
I agree with the above note, plan 

## 2019-06-24 ENCOUNTER — Ambulatory Visit: Payer: Medicare Other

## 2019-07-14 ENCOUNTER — Other Ambulatory Visit: Payer: Self-pay

## 2019-07-14 MED ORDER — ZOSTER VAC RECOMB ADJUVANTED 50 MCG/0.5ML IM SUSR: 0.5000 mL | Freq: Once | INTRAMUSCULAR | 1 refills | Status: AC

## 2019-08-26 ENCOUNTER — Other Ambulatory Visit: Payer: Self-pay

## 2019-08-26 DIAGNOSIS — Z20822 Contact with and (suspected) exposure to covid-19: Secondary | ICD-10-CM

## 2019-08-27 LAB — NOVEL CORONAVIRUS, NAA: SARS-CoV-2, NAA: NOT DETECTED

## 2019-09-08 ENCOUNTER — Telehealth: Payer: Self-pay

## 2019-09-08 NOTE — Telephone Encounter (Signed)
Pt notified of negative COVID-19 results. Understanding verbalized.  Chasta M Hopkins   

## 2019-10-18 ENCOUNTER — Ambulatory Visit: Payer: Medicare Other

## 2019-10-27 ENCOUNTER — Ambulatory Visit: Payer: Medicare PPO | Attending: Internal Medicine

## 2019-10-27 DIAGNOSIS — Z23 Encounter for immunization: Secondary | ICD-10-CM | POA: Insufficient documentation

## 2019-10-27 NOTE — Progress Notes (Signed)
   Covid-19 Vaccination Clinic  Name:  Angela Barber    MRN: [id number] DOB: 11-11-1935  10/27/2019  [name] was observed post Covid-19 immunization for 15 minutes without incidence. She was provided with Vaccine Information Sheet and instruction to access the V-Safe system.   [name] was instructed to call 911 with any severe reactions post vaccine: Marland Kitchen Difficulty breathing  . Swelling of your face and throat  . A fast heartbeat  . A bad rash all over your body  . Dizziness and weakness    Immunizations Administered    Name Date Dose VIS Date Route   Pfizer COVID-19 Vaccine 10/27/2019  5:19 PM 0.3 mL 09/02/2019 Intramuscular   Manufacturer: [place]   Lot: [id number]   [place]: [id number]

## 2019-11-22 ENCOUNTER — Ambulatory Visit: Payer: Medicare PPO | Attending: Internal Medicine

## 2019-11-22 DIAGNOSIS — Z23 Encounter for immunization: Secondary | ICD-10-CM | POA: Insufficient documentation

## 2019-11-22 NOTE — Progress Notes (Signed)
   Covid-19 Vaccination Clinic  Name:  Angela Barber    MRN: [id number] DOB: 1936/01/05  11/22/2019  [name] was observed post Covid-19 immunization for 15 minutes without incident. She was provided with Vaccine Information Sheet and instruction to access the V-Safe system.   [name] was instructed to call 911 with any severe reactions post vaccine: Marland Kitchen Difficulty breathing  . Swelling of face and throat  . A fast heartbeat  . A bad rash all over body  . Dizziness and weakness   Immunizations Administered    Name Date Dose VIS Date Route   Pfizer COVID-19 Vaccine 11/22/2019  9:01 AM 0.3 mL 09/02/2019 Intramuscular   Manufacturer: [place]   Lot: [id number]   [place]: [id number]

## 2019-11-28 ENCOUNTER — Other Ambulatory Visit: Payer: Self-pay | Admitting: Internal Medicine

## 2019-12-05 DIAGNOSIS — H04123 Dry eye syndrome of bilateral lacrimal glands: Secondary | ICD-10-CM | POA: Diagnosis not present

## 2019-12-05 DIAGNOSIS — H40013 Open angle with borderline findings, low risk, bilateral: Secondary | ICD-10-CM | POA: Diagnosis not present

## 2019-12-05 DIAGNOSIS — H2513 Age-related nuclear cataract, bilateral: Secondary | ICD-10-CM | POA: Diagnosis not present

## 2019-12-22 ENCOUNTER — Telehealth: Payer: Self-pay | Admitting: Internal Medicine

## 2019-12-22 NOTE — Telephone Encounter (Signed)
    1.Medication Requested:mirabegron ER (MYRBETRIQ) 50 MG TB24 tablet  2. Pharmacy ([place], [place]):[facility], [place]  3. On Med List: yes  4. Last Visit with PCP: 10/29/18  5. Next visit date with PCP: 01/06/20   Agent: Please be advised that RX refills may take up to 3 business days. We ask that you follow-up with your pharmacy.

## 2019-12-24 ENCOUNTER — Other Ambulatory Visit: Payer: Self-pay | Admitting: Internal Medicine

## 2019-12-26 ENCOUNTER — Other Ambulatory Visit: Payer: Self-pay | Admitting: Internal Medicine

## 2019-12-27 DIAGNOSIS — L578 Other skin changes due to chronic exposure to nonionizing radiation: Secondary | ICD-10-CM | POA: Diagnosis not present

## 2019-12-27 DIAGNOSIS — L821 Other seborrheic keratosis: Secondary | ICD-10-CM | POA: Diagnosis not present

## 2019-12-27 DIAGNOSIS — D2262 Melanocytic nevi of left upper limb, including shoulder: Secondary | ICD-10-CM | POA: Diagnosis not present

## 2019-12-27 DIAGNOSIS — L570 Actinic keratosis: Secondary | ICD-10-CM | POA: Diagnosis not present

## 2020-01-04 ENCOUNTER — Other Ambulatory Visit: Payer: Self-pay | Admitting: Internal Medicine

## 2020-01-06 ENCOUNTER — Encounter: Payer: Self-pay | Admitting: Internal Medicine

## 2020-01-06 ENCOUNTER — Ambulatory Visit (INDEPENDENT_AMBULATORY_CARE_PROVIDER_SITE_OTHER): Payer: Medicare PPO | Admitting: Internal Medicine

## 2020-01-06 ENCOUNTER — Other Ambulatory Visit: Payer: Self-pay

## 2020-01-06 ENCOUNTER — Encounter: Payer: Medicare PPO | Admitting: Internal Medicine

## 2020-01-06 VITALS — BP 118/72 | HR 82 | Temp 98.2°F | Ht 63.0 in | Wt 148.0 lb

## 2020-01-06 DIAGNOSIS — E782 Mixed hyperlipidemia: Secondary | ICD-10-CM

## 2020-01-06 DIAGNOSIS — Z0000 Encounter for general adult medical examination without abnormal findings: Secondary | ICD-10-CM

## 2020-01-06 DIAGNOSIS — M170 Bilateral primary osteoarthritis of knee: Secondary | ICD-10-CM | POA: Insufficient documentation

## 2020-01-06 DIAGNOSIS — H9193 Unspecified hearing loss, bilateral: Secondary | ICD-10-CM

## 2020-01-06 DIAGNOSIS — N3946 Mixed incontinence: Secondary | ICD-10-CM | POA: Diagnosis not present

## 2020-01-06 LAB — LIPID PANEL
Cholesterol: 177 mg/dL (ref 0–200)
HDL: 42.5 mg/dL (ref >39.00)
NonHDL: 134.31
Total CHOL/HDL Ratio: 4
Triglycerides: 201 mg/dL — ABNORMAL HIGH (ref 0.0–149.0)
VLDL: 40.2 mg/dL — ABNORMAL HIGH (ref 0.0–40.0)

## 2020-01-06 LAB — CBC
HCT: 45.5 % (ref 36.0–46.0)
Hemoglobin: 15.6 g/dL — ABNORMAL HIGH (ref 12.0–15.0)
MCHC: 34.3 g/dL (ref 30.0–36.0)
MCV: 93 fl (ref 78.0–100.0)
Platelets: 282 10*3/uL (ref 150.0–400.0)
RBC: 4.89 Mil/uL (ref 3.87–5.11)
RDW: 13.6 % (ref 11.5–15.5)
WBC: 7.6 10*3/uL (ref 4.0–10.5)

## 2020-01-06 LAB — COMPREHENSIVE METABOLIC PANEL
ALT: 21 U/L (ref 0–35)
AST: 22 U/L (ref 0–37)
Albumin: 4.5 g/dL (ref 3.5–5.2)
Alkaline Phosphatase: 52 U/L (ref 39–117)
BUN: 18 mg/dL (ref 6–23)
CO2: 31 mEq/L (ref 19–32)
Calcium: 9.9 mg/dL (ref 8.4–10.5)
Chloride: 103 mEq/L (ref 96–112)
Creatinine, Ser: 0.79 mg/dL (ref 0.40–1.20)
GFR: 69.67 mL/min (ref >60.00)
Glucose, Bld: 91 mg/dL (ref 70–99)
Potassium: 5 mEq/L (ref 3.5–5.1)
Sodium: 141 mEq/L (ref 135–145)
Total Bilirubin: 0.7 mg/dL (ref 0.2–1.2)
Total Protein: 6.9 g/dL (ref 6.0–8.3)

## 2020-01-06 LAB — VITAMIN B12: Vitamin B-12: 233 pg/mL (ref 211–911)

## 2020-01-06 LAB — VITAMIN D 25 HYDROXY (VIT D DEFICIENCY, FRACTURES): VITD: 20.28 ng/mL — ABNORMAL LOW (ref 30.00–100.00)

## 2020-01-06 LAB — TSH: TSH: 4.54 u[IU]/mL — ABNORMAL HIGH (ref 0.35–4.50)

## 2020-01-06 LAB — LDL CHOLESTEROL, DIRECT: Direct LDL: 103 mg/dL

## 2020-01-06 MED ORDER — MIRABEGRON ER 50 MG PO TB24: 50.0000 mg | ORAL_TABLET | Freq: Every day | ORAL | 3 refills | Status: DC

## 2020-01-06 NOTE — Assessment & Plan Note (Signed)
Flu shot up to date. Covid-19 complete. Pneumonia complete. Shingrix counseled. Tetanus due 2021. Colonoscopy aged out. Mammogram aged out, pap smear aged out and dexa declines further. Counseled about sun safety and mole surveillance. Counseled about the dangers of distracted driving. Given 10 year screening recommendations.

## 2020-01-06 NOTE — Assessment & Plan Note (Signed)
Referral to orthopedic as her knees are causing a lot of problems and interfering with ADLs.

## 2020-01-06 NOTE — Assessment & Plan Note (Signed)
Checking lipid panel and adjust as needed.  

## 2020-01-06 NOTE — Patient Instructions (Addendum)
We will check the blood work today.  We will get you in with the knee specialist Dr. Janann August   Health Maintenance, Female Adopting a healthy lifestyle and getting preventive care are important in promoting health and wellness. Ask your health care provider about:  The right schedule for you to have regular tests and exams.  Things you can do on your own to prevent diseases and keep yourself healthy. What should I know about diet, weight, and exercise? Eat a healthy diet   Eat a diet that includes plenty of vegetables, fruits, low-fat dairy products, and lean protein.  Do not eat a lot of foods that are high in solid fats, added sugars, or sodium. Maintain a healthy weight Body mass index (BMI) is used to identify weight problems. It estimates body fat based on height and weight. Your health care provider can help determine your BMI and help you achieve or maintain a healthy weight. Get regular exercise Get regular exercise. This is one of the most important things you can do for your health. Most adults should:  Exercise for at least 150 minutes each week. The exercise should increase your heart rate and make you sweat (moderate-intensity exercise).  Do strengthening exercises at least twice a week. This is in addition to the moderate-intensity exercise.  Spend less time sitting. Even light physical activity can be beneficial. Watch cholesterol and blood lipids Have your blood tested for lipids and cholesterol at 81 years of age, then have this test every 5 years. Have your cholesterol levels checked more often if:  Your lipid or cholesterol levels are high.  You are older than 82 years of age.  You are at high risk for heart disease. What should I know about cancer screening? Depending on your health history and family history, you may need to have cancer screening at various ages. This may include screening for:  Breast cancer.  Cervical cancer.  Colorectal  cancer.  Skin cancer.  Lung cancer. What should I know about heart disease, diabetes, and high blood pressure? Blood pressure and heart disease  High blood pressure causes heart disease and increases the risk of stroke. This is more likely to develop in people who have high blood pressure readings, are of African descent, or are overweight.  Have your blood pressure checked: ? Every 3-5 years if you are 36-79 years of age. ? Every year if you are 17 years old or older. Diabetes Have regular diabetes screenings. This checks your fasting blood sugar level. Have the screening done:  Once every three years after age 74 if you are at a normal weight and have a low risk for diabetes.  More often and at a younger age if you are overweight or have a high risk for diabetes. What should I know about preventing infection? Hepatitis B If you have a higher risk for hepatitis B, you should be screened for this virus. Talk with your health care provider to find out if you are at risk for hepatitis B infection. Hepatitis C Testing is recommended for:  Everyone born from [birth year] through 1963.  Anyone with known risk factors for hepatitis C. Sexually transmitted infections (STIs)  Get screened for STIs, including gonorrhea and chlamydia, if: ? You are sexually active and are younger than 82 years of age. ? You are older than 82 years of age and your health care provider tells you that you are at risk for this type of infection. ? Your sexual activity has changed  since you were last screened, and you are at increased risk for chlamydia or gonorrhea. Ask your health care provider if you are at risk.  Ask your health care provider about whether you are at high risk for HIV. Your health care provider may recommend a prescription medicine to help prevent HIV infection. If you choose to take medicine to prevent HIV, you should first get tested for HIV. You should then be tested every 3 months for as long as  you are taking the medicine. Pregnancy  If you are about to stop having your period (premenopausal) and you may become pregnant, seek counseling before you get pregnant.  Take 400 to 800 micrograms (mcg) of folic acid every day if you become pregnant.  Ask for birth control (contraception) if you want to prevent pregnancy. Osteoporosis and menopause Osteoporosis is a disease in which the bones lose minerals and strength with aging. This can result in bone fractures. If you are 17 years old or older, or if you are at risk for osteoporosis and fractures, ask your health care provider if you should:  Be screened for bone loss.  Take a calcium or vitamin D supplement to lower your risk of fractures.  Be given hormone replacement therapy (HRT) to treat symptoms of menopause. Follow these instructions at home: Lifestyle  Do not use any products that contain nicotine or tobacco, such as cigarettes, e-cigarettes, and chewing tobacco. If you need help quitting, ask your health care provider.  Do not use street drugs.  Do not share needles.  Ask your health care provider for help if you need support or information about quitting drugs. Alcohol use  Do not drink alcohol if: ? Your health care provider tells you not to drink. ? You are pregnant, may be pregnant, or are planning to become pregnant.  If you drink alcohol: ? Limit how much you use to 0-1 drink a day. ? Limit intake if you are breastfeeding.  Be aware of how much alcohol is in your drink. In the [place] one drink equals one 12 oz bottle of beer (355 mL), one 5 oz glass of wine (148 mL), or one 1 oz glass of hard liquor (44 mL). General instructions  Schedule regular health, dental, and eye exams.  Stay current with your vaccines.  Tell your health care provider if: ? You often feel depressed. ? You have ever been abused or do not feel safe at home. Summary  Adopting a healthy lifestyle and getting preventive care are  important in promoting health and wellness.  Follow your health care provider's instructions about healthy diet, exercising, and getting tested or screened for diseases.  Follow your health care provider's instructions on monitoring your cholesterol and blood pressure. This information is not intended to replace advice given to you by your health care provider. Make sure you discuss any questions you have with your health care provider. Document Revised: 09/01/2018 Document Reviewed: 09/01/2018 Elsevier Patient Education  2020 [employer].

## 2020-01-06 NOTE — Assessment & Plan Note (Signed)
Taking myrbetriq 50 mg daily and overall not great control but wants to continue.

## 2020-01-06 NOTE — Progress Notes (Signed)
Subjective:   Patient ID: Angela Barber, female    DOB: [birth year], 84 y.o.   MRN: [id number]  HPI Here for medicare wellness and physical, no new complaints. Please see A/P for status and treatment of chronic medical problems.   Diet: heart healthy Physical activity: sedentary Depression/mood screen: negative Hearing: severe loss bilaterally, left hearing aid and right implant Visual acuity: grossly normal with lens, up to date for annual eye exam  ADLs: capable Fall risk: none Home safety: good Cognitive evaluation: intact to orientation, naming, recall and repetition EOL planning: adv directives discussed    Office Visit from 01/06/2020 in [place] [facility] Total Score  0      I have personally reviewed and have noted 1. The patient's medical and social history - reviewed today no changes 2. Their use of alcohol, tobacco or illicit drugs 3. Their current medications and supplements 4. The patient's functional ability including ADL's, fall risks, home safety risks and hearing or visual impairment. 5. Diet and physical activities 6. Evidence for depression or mood disorders 7. Care team reviewed and updated  Patient Care Team: Hoyt Koch, MD as PCP - General (Internal Medicine) Past Medical History:  Diagnosis Date  . Anxiety   . C. difficile diarrhea 9/06  . Cochlear implant status   . Depression   . Diverticulosis   . Hearing loss of both ears   . HLD (hyperlipidemia)   . Hyperplastic colon polyp   . Insomnia   . Internal hemorrhoid   . Osteoarthritis, knee    Past Surgical History:  Procedure Laterality Date  . BUNIONECTOMY     right  . CHOLECYSTECTOMY N/A 07/25/2016   Procedure: LAPAROSCOPIC CHOLECYSTECTOMY;  Surgeon: Jackolyn Confer, MD;  Location: [facility];  Service: General;  Laterality: N/A;  Royann Shivers IMPLANT  July '13   right ear. Dr. Idelle Crouch  . ERCP N/A 07/31/2016   Procedure: ENDOSCOPIC RETROGRADE  CHOLANGIOPANCREATOGRAPHY (ERCP);  Surgeon: Milus Banister, MD;  Location: [facility];  Service: Endoscopy;  Laterality: N/A;  . ROTATOR CUFF REPAIR Left 2012  . ROTATOR CUFF REPAIR Right 2007 & 2009  . TONSILLECTOMY    . VAGINAL HYSTERECTOMY     Family History  Problem Relation Age of Onset  . Ovarian cancer Mother   . Transient ischemic attack Mother   . Breast cancer Maternal Aunt        aunts  . Stroke Sister   . Anxiety disorder Daughter   . Autoimmune disease Daughter    Review of Systems  Constitutional: Positive for activity change.  HENT: Positive for hearing loss.   Eyes: Negative.   Respiratory: Negative for cough, chest tightness and shortness of breath.   Cardiovascular: Negative for chest pain, palpitations and leg swelling.  Gastrointestinal: Negative for abdominal distention, abdominal pain, constipation, diarrhea, nausea and vomiting.  Musculoskeletal: Positive for arthralgias and myalgias.  Skin: Negative.   Neurological: Negative.   Psychiatric/Behavioral: Negative.     Objective:  Physical Exam Constitutional:      Appearance: She is well-developed.  HENT:     Head: Normocephalic and atraumatic.  Cardiovascular:     Rate and Rhythm: Normal rate and regular rhythm.  Pulmonary:     Effort: Pulmonary effort is normal. No respiratory distress.     Breath sounds: Normal breath sounds. No wheezing or rales.  Abdominal:     General: Bowel sounds are normal. There is no distension.     Palpations: Abdomen  is soft.     Tenderness: There is no abdominal tenderness. There is no rebound.  Musculoskeletal:        General: Tenderness present.     Cervical back: Normal range of motion.     Comments: Bilateral knee pain  Skin:    General: Skin is warm and dry.  Neurological:     Mental Status: She is alert and oriented to person, place, and time.     Coordination: Coordination normal.     Vitals:   01/06/20 0953  BP: 118/72  Pulse: 82  Temp: 98.2 F  (36.8 C)  SpO2: 95%  Weight: 148 lb (67.1 kg)  Height: 5\' 3"  (1.6 m)    This visit occurred during the SARS-CoV-2 public health emergency.  Safety protocols were in place, including screening questions prior to the visit, additional usage of staff PPE, and extensive cleaning of exam room while observing appropriate contact time as indicated for disinfecting solutions.   Assessment & Plan:

## 2020-01-06 NOTE — Assessment & Plan Note (Signed)
Severe and worsening with right cochlear implant and left hearing aid.

## 2020-03-08 DIAGNOSIS — H903 Sensorineural hearing loss, bilateral: Secondary | ICD-10-CM | POA: Diagnosis not present

## 2020-03-12 ENCOUNTER — Encounter: Payer: Self-pay | Admitting: Internal Medicine

## 2020-04-29 DIAGNOSIS — T7840XA Allergy, unspecified, initial encounter: Secondary | ICD-10-CM | POA: Diagnosis not present

## 2020-05-09 ENCOUNTER — Other Ambulatory Visit: Payer: Self-pay | Admitting: Physician Assistant

## 2020-06-11 ENCOUNTER — Other Ambulatory Visit: Payer: Self-pay | Admitting: Physician Assistant

## 2020-06-28 DIAGNOSIS — K529 Noninfective gastroenteritis and colitis, unspecified: Secondary | ICD-10-CM | POA: Insufficient documentation

## 2020-06-28 NOTE — Progress Notes (Signed)
Subjective:    Patient ID: Angela Barber, female    DOB: 1935/10/04, 84 y.o.   MRN: [id number]  HPI The patient is here for follow up of their chronic medical problems, including urinary incontinence and chronic diarrhea  Has chronic b/l shoulder pain - surgeries on both.  She also has b/l knee pain - arthritis.  She has chornic left shoulder pain from scar tissue and has limited ROM.  Recently she has had R shoulder pain.  She still has FROM.   Both knees hurt.  She has know OA for years.  She has not been to a surgeon due to the only solution being TKR.  She can not get out of a chair w/o using her arms.  Her whole legs hurt.  She is unsure if she has swelling.  She takes ibuprofen.   She is wondering if she should see a rheumatologist.  She needs refills of her to chronic medications.   Medications and allergies reviewed with patient and updated if appropriate.  Patient Active Problem List   Diagnosis Date Noted  . Bilateral shoulder pain 06/29/2020  . Chronic diarrhea 06/28/2020  . Primary osteoarthritis of both knees 01/06/2020  . Urinary incontinence 10/14/2017  . Irregular heart beat 08/09/2016  . Symptomatic varicose veins of both lower extremities 07/25/2015  . Routine general medical examination at a health care facility 08/13/2014  . 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 Hyperlipidemia 05/24/2009  . Hearing loss 04/25/2008  . CLOSTRIDIUM DIFFICILE COLITIS, HX OF 10/23/2007    Current Outpatient Medications on File Prior to Visit  Medication Sig Dispense Refill  . bifidobacterium infantis (ALIGN) capsule Take 1 capsule by mouth daily as needed.     . diclofenac Sodium (VOLTAREN) 1 % GEL Apply topically.    . fluticasone (FLONASE) 50 MCG/ACT nasal spray Place 2 sprays into both nostrils daily. (Patient taking differently: Place 2 sprays into both nostrils daily as needed. ) 16 g 6  . triamcinolone cream (KENALOG) 0.1 %      No current  facility-administered medications on file prior to visit.    Past Medical History:  Diagnosis Date  . Anxiety   . C. difficile diarrhea 9/06  . Cochlear implant status   . Depression   . Diverticulosis   . Hearing loss of both ears   . HLD (hyperlipidemia)   . Hyperplastic colon polyp   . Insomnia   . Internal hemorrhoid   . Osteoarthritis, knee     Past Surgical History:  Procedure Laterality Date  . BUNIONECTOMY     right  . CHOLECYSTECTOMY N/A 07/25/2016   Procedure: LAPAROSCOPIC CHOLECYSTECTOMY;  Surgeon: Jackolyn Confer, MD;  Location: [facility];  Service: General;  Laterality: N/A;  Royann Shivers IMPLANT  July '13   right ear. Dr. Idelle Crouch  . ERCP N/A 07/31/2016   Procedure: ENDOSCOPIC RETROGRADE CHOLANGIOPANCREATOGRAPHY (ERCP);  Surgeon: Milus Banister, MD;  Location: [facility];  Service: Endoscopy;  Laterality: N/A;  . ROTATOR CUFF REPAIR Left 2012  . ROTATOR CUFF REPAIR Right 2007 & 2009  . TONSILLECTOMY    . VAGINAL HYSTERECTOMY      Social History   Socioeconomic History  . Marital status: Widowed    Spouse name: Not on file  . Number of children: 2  . Years of education: Master's  . Highest education level: Not on file  Occupational History  . Occupation: Product manager: RETIRED  Tobacco Use  . Smoking  status: Former Research scientist (life sciences)  . Smokeless tobacco: Never Used  Vaping Use  . Vaping Use: Never used  Substance and Sexual Activity  . Alcohol use: Yes    Comment: 1 glass of wine a day  . Drug use: No  . Sexual activity: Not Currently  Other Topics Concern  . Not on file  Social History Narrative   Patient is widowed and lives alone.   Patient has two adult children.   Patient is retired.   Patient has a Scientist, water quality.   Patient is right-handed.   Patient drinks two cups of coffee daily.   Social Determinants of Health   Financial Resource Strain:   . Difficulty of Paying Living Expenses: Not on file  Food Insecurity:   . Worried About  Charity fundraiser in the Last Year: Not on file  . Ran Out of Food in the Last Year: Not on file  Transportation Needs:   . Lack of Transportation (Medical): Not on file  . Lack of Transportation (Non-Medical): Not on file  Physical Activity:   . Days of Exercise per Week: Not on file  . Minutes of Exercise per Session: Not on file  Stress:   . Feeling of Stress : Not on file  Social Connections:   . Frequency of Communication with Friends and Family: Not on file  . Frequency of Social Gatherings with Friends and Family: Not on file  . Attends Religious Services: Not on file  . Active Member of Clubs or Organizations: Not on file  . Attends Archivist Meetings: Not on file  . Marital Status: Not on file    Family History  Problem Relation Age of Onset  . Ovarian cancer Mother   . Transient ischemic attack Mother   . Breast cancer Maternal Aunt        aunts  . Stroke Sister   . Anxiety disorder Daughter   . Autoimmune disease Daughter     Review of Systems  Constitutional: Negative for fever.  Genitourinary: Negative for dysuria and hematuria.  Musculoskeletal: Positive for arthralgias. Negative for joint swelling.  Neurological: Positive for dizziness (rare). Negative for headaches.       Objective:   Vitals:   06/29/20 1322  BP: 110/62  Pulse: 91  Temp: 98 F (36.7 C)  SpO2: 97%   BP Readings from Last 3 Encounters:  06/29/20 110/62  01/06/20 118/72  05/04/19 (!) 86/62   Wt Readings from Last 3 Encounters:  06/29/20 156 lb (70.8 kg)  01/06/20 148 lb (67.1 kg)  05/04/19 148 lb 12.8 oz (67.5 kg)   Body mass index is 27.63 kg/m.   Physical Exam    Constitutional: Appears well-developed and well-nourished. No distress.  HENT:  Head: Normocephalic and atraumatic.  Neck: Neck supple. No tracheal deviation present. No thyromegaly present.  No cervical lymphadenopathy Cardiovascular: Normal rate, regular rhythm and normal heart sounds.   No  murmur heard. No carotid bruit .  No edema Pulmonary/Chest: Effort normal and breath sounds normal. No respiratory distress. No has no wheezes. No rales.  Skin: Skin is warm and dry. Not diaphoretic.  Psychiatric: Normal mood and affect. Behavior is normal.      Assessment & Plan:    See Problem List for Assessment and Plan of chronic medical problems.    This visit occurred during the SARS-CoV-2 public health emergency.  Safety protocols were in place, including screening questions prior to the visit, additional usage of staff PPE, and  extensive cleaning of exam room while observing appropriate contact time as indicated for disinfecting solutions.

## 2020-06-29 ENCOUNTER — Other Ambulatory Visit: Payer: Self-pay

## 2020-06-29 ENCOUNTER — Ambulatory Visit (INDEPENDENT_AMBULATORY_CARE_PROVIDER_SITE_OTHER): Payer: Medicare PPO | Admitting: Internal Medicine

## 2020-06-29 ENCOUNTER — Encounter: Payer: Self-pay | Admitting: Internal Medicine

## 2020-06-29 VITALS — BP 110/62 | HR 91 | Temp 98.0°F | Ht 63.0 in | Wt 156.0 lb

## 2020-06-29 DIAGNOSIS — M170 Bilateral primary osteoarthritis of knee: Secondary | ICD-10-CM

## 2020-06-29 DIAGNOSIS — M25511 Pain in right shoulder: Secondary | ICD-10-CM | POA: Diagnosis not present

## 2020-06-29 DIAGNOSIS — K529 Noninfective gastroenteritis and colitis, unspecified: Secondary | ICD-10-CM

## 2020-06-29 DIAGNOSIS — M25512 Pain in left shoulder: Secondary | ICD-10-CM | POA: Diagnosis not present

## 2020-06-29 DIAGNOSIS — G8929 Other chronic pain: Secondary | ICD-10-CM

## 2020-06-29 DIAGNOSIS — N3946 Mixed incontinence: Secondary | ICD-10-CM

## 2020-06-29 DIAGNOSIS — Z23 Encounter for immunization: Secondary | ICD-10-CM | POA: Diagnosis not present

## 2020-06-29 DIAGNOSIS — H903 Sensorineural hearing loss, bilateral: Secondary | ICD-10-CM | POA: Diagnosis not present

## 2020-06-29 MED ORDER — CHOLESTYRAMINE 4 G PO PACK: 4.0000 g | PACK | Freq: Every day | ORAL | 1 refills | Status: DC

## 2020-06-29 MED ORDER — MIRABEGRON ER 50 MG PO TB24: 50.0000 mg | ORAL_TABLET | Freq: Every day | ORAL | 1 refills | Status: DC

## 2020-06-29 NOTE — Assessment & Plan Note (Signed)
Chronic Taking Myrbetriq daily-this does help, but she still has symptoms Continue Myrbetriq 50 mg daily-refill today

## 2020-06-29 NOTE — Assessment & Plan Note (Signed)
Chronic Has had surgery on both shoulders Decreased range of motion of left shoulder, pain in both Symptoms have worsened Advised further evaluation by orthopedics-referred

## 2020-06-29 NOTE — Assessment & Plan Note (Signed)
Chronic Has seen GI Diarrhea controlled with Questran 4 g daily Continue once daily-refill today

## 2020-06-29 NOTE — Assessment & Plan Note (Signed)
Chronic Arthritis of both knees and has been told in the past that she may need knee replacement Having increased pain and decreased mobility She is taking ibuprofen Advised further evaluation with orthopedics-referred

## 2020-06-29 NOTE — Patient Instructions (Addendum)
Flu immunization administered today.    Medications reviewed and updated.  Changes include :   none   Your prescription(s) have been submitted to your pharmacy. Please take as directed and contact our office if you believe you are having problem(s) with the medication(s).   A referral was ordered for [facility].       Someone from their office will call you to schedule an appointment.

## 2020-07-17 ENCOUNTER — Encounter: Payer: Self-pay | Admitting: Orthopaedic Surgery

## 2020-07-17 ENCOUNTER — Ambulatory Visit: Payer: Medicare PPO | Admitting: Orthopaedic Surgery

## 2020-07-17 ENCOUNTER — Ambulatory Visit: Payer: Self-pay

## 2020-07-17 ENCOUNTER — Ambulatory Visit (INDEPENDENT_AMBULATORY_CARE_PROVIDER_SITE_OTHER): Payer: Medicare PPO

## 2020-07-17 ENCOUNTER — Other Ambulatory Visit: Payer: Self-pay

## 2020-07-17 ENCOUNTER — Telehealth: Payer: Self-pay | Admitting: Internal Medicine

## 2020-07-17 DIAGNOSIS — G8929 Other chronic pain: Secondary | ICD-10-CM | POA: Diagnosis not present

## 2020-07-17 DIAGNOSIS — M25512 Pain in left shoulder: Secondary | ICD-10-CM

## 2020-07-17 DIAGNOSIS — M25561 Pain in right knee: Secondary | ICD-10-CM

## 2020-07-17 DIAGNOSIS — M25562 Pain in left knee: Secondary | ICD-10-CM

## 2020-07-17 DIAGNOSIS — Z13820 Encounter for screening for osteoporosis: Secondary | ICD-10-CM

## 2020-07-17 DIAGNOSIS — M25511 Pain in right shoulder: Secondary | ICD-10-CM

## 2020-07-17 DIAGNOSIS — Z1231 Encounter for screening mammogram for malignant neoplasm of breast: Secondary | ICD-10-CM

## 2020-07-17 DIAGNOSIS — E2839 Other primary ovarian failure: Secondary | ICD-10-CM

## 2020-07-17 NOTE — Telephone Encounter (Signed)
Patient requesting a order or a referral to somewhere she can get a mammogram and bone density test. She says she is over due. [phone]

## 2020-07-17 NOTE — Progress Notes (Signed)
Office Visit Note   Patient: Angela Barber           Date of Birth: [birth year]           MRN: [id number] Visit Date: 07/17/2020              Requested by: Binnie Rail, MD [place],  [place] [address] PCP: Hoyt Koch, MD   Assessment & Plan: Visit Diagnoses:  1. Chronic pain of both knees   2. Chronic pain of both shoulders     Plan: Impression is advanced degenerative joint disease bilateral knees and bilateral shoulders.  The patient would like to proceed with intra-articular cortisone injection to the right knee and left shoulder, but notes that she will be heading to get her Covid booster this afternoon.  She will wait 2 weeks and then come back and see [place] for these injections.  Follow-Up Instructions: Return in about 2 weeks (around 07/31/2020) for right knee injection and left shoulder GH injection with Dr. Junius Roads.   Orders:  Orders Placed This Encounter  Procedures  . XR Shoulder Right  . XR Shoulder Left  . XR KNEE 3 VIEW RIGHT  . XR KNEE 3 VIEW LEFT   No orders of the defined types were placed in this encounter.     Procedures: No procedures performed   Clinical Data: No additional findings.   Subjective: Chief Complaint  Patient presents with  . Right Shoulder - Pain  . Left Shoulder - Pain  . Left Knee - Pain  . Right Knee - Pain    HPI patient is a very pleasant 84 year old female who comes in today with bilateral knee and bilateral shoulder pain.  In regards to the knees, she has had pain to both knees for years.  She notes that the right is far worse than the left.  She describes this as a constant ache worse with going up and down stairs.  She has associated instability on the left.  She takes Tylenol once daily which does seem to help somewhat.  She does have a remote history of what she thinks is a meniscus tear to the left knee which was subsequently injected with cortisone which did help.  No cortisone injection to the  right knee.  In regards to her shoulders, the pain is worse on the left.  She has a history of rotator cuff repair x2 on the right and rotator cuff repair x1 on the left.  The pain she has is to the entire aspect of both shoulders.  She has increased pain sleeping on the left side as well as with driving in trying to put on deodorant.  Tylenol does not seem to relieve her symptoms.  No numbness, tingling or burning to either upper extremity.  She has not had cortisone injections and surgical intervention.  Review of Systems as detailed in HPI.  All others reviewed and are negative.   Objective: Vital Signs: There were no vitals taken for this visit.  Physical Exam well-developed well-nourished female no acute distress.  Alert and oriented x3.   Ortho Exam bilateral knee exam shows trace effusions.  Range of motion 0 to 120 degrees.  Medial and lateral joint line tenderness.  Marked L femoral crepitus right greater than left.  Ligaments are stable.  She is neurovascular intact distally.  Left shoulder exam shows forward flexion to about 160 degrees.  She can internally rotate to L5.  Positive empty can.  Negative drop arm.  Right shoulder exam shows full active range of motion all planes.  Negative empty can.  Full strength throughout.  She is neurovascular intact distally.  Specialty Comments:  No specialty comments available.  Imaging: XR KNEE 3 VIEW LEFT  Result Date: 07/17/2020 Advanced degenerative changes to all 3 compartments  XR KNEE 3 VIEW RIGHT  Result Date: 07/17/2020 Advanced tricompartmental degenerative changes  XR Shoulder Left  Result Date: 07/17/2020 Marked degenerative changes to the glenohumeral joint  XR Shoulder Right  Result Date: 07/17/2020 Marked degenerative changes the glenohumeral joint    PMFS History: Patient Active Problem List   Diagnosis Date Noted  . Bilateral shoulder pain 06/29/2020  . Chronic diarrhea 06/28/2020  . Primary osteoarthritis  of both knees 01/06/2020  . Urinary incontinence 10/14/2017  . Irregular heart beat 08/09/2016  . Symptomatic varicose veins of both lower extremities 07/25/2015  . Routine general medical examination at a health care facility 08/13/2014  . 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 Hyperlipidemia 05/24/2009  . Hearing loss 04/25/2008  . CLOSTRIDIUM DIFFICILE COLITIS, HX OF 10/23/2007   Past Medical History:  Diagnosis Date  . Anxiety   . C. difficile diarrhea 9/06  . Cochlear implant status   . Depression   . Diverticulosis   . Hearing loss of both ears   . HLD (hyperlipidemia)   . Hyperplastic colon polyp   . Insomnia   . Internal hemorrhoid   . Osteoarthritis, knee     Family History  Problem Relation Age of Onset  . Ovarian cancer Mother   . Transient ischemic attack Mother   . Breast cancer Maternal Aunt        aunts  . Stroke Sister   . Anxiety disorder Daughter   . Autoimmune disease Daughter     Past Surgical History:  Procedure Laterality Date  . BUNIONECTOMY     right  . CHOLECYSTECTOMY N/A 07/25/2016   Procedure: LAPAROSCOPIC CHOLECYSTECTOMY;  Surgeon: Jackolyn Confer, MD;  Location: [facility];  Service: General;  Laterality: N/A;  Royann Shivers IMPLANT  July '13   right ear. Dr. Idelle Crouch  . ERCP N/A 07/31/2016   Procedure: ENDOSCOPIC RETROGRADE CHOLANGIOPANCREATOGRAPHY (ERCP);  Surgeon: Milus Banister, MD;  Location: [facility];  Service: Endoscopy;  Laterality: N/A;  . ROTATOR CUFF REPAIR Left 2012  . ROTATOR CUFF REPAIR Right 2007 & 2009  . TONSILLECTOMY    . VAGINAL HYSTERECTOMY     Social History   Occupational History  . Occupation: Product manager: RETIRED  Tobacco Use  . Smoking status: Former Research scientist (life sciences)  . Smokeless tobacco: Never Used  Vaping Use  . Vaping Use: Never used  Substance and Sexual Activity  . Alcohol use: Yes    Comment: 1 glass of wine a day  . Drug use: No  . Sexual activity: Not Currently

## 2020-07-18 NOTE — Telephone Encounter (Signed)
Both ordered for [facility] since that is where she has had her mammo done in the past

## 2020-07-18 NOTE — Addendum Note (Signed)
Addended by: Binnie Rail on: 07/18/2020 08:46 AM   Modules accepted: Orders

## 2020-07-31 ENCOUNTER — Encounter: Payer: Self-pay | Admitting: Orthopaedic Surgery

## 2020-07-31 ENCOUNTER — Ambulatory Visit: Payer: Medicare PPO | Admitting: Family Medicine

## 2020-07-31 ENCOUNTER — Ambulatory Visit: Payer: Medicare PPO | Admitting: Orthopaedic Surgery

## 2020-07-31 ENCOUNTER — Ambulatory Visit: Payer: Self-pay

## 2020-07-31 DIAGNOSIS — M1711 Unilateral primary osteoarthritis, right knee: Secondary | ICD-10-CM | POA: Diagnosis not present

## 2020-07-31 DIAGNOSIS — M19012 Primary osteoarthritis, left shoulder: Secondary | ICD-10-CM

## 2020-07-31 MED ORDER — METHYLPREDNISOLONE ACETATE 40 MG/ML IJ SUSP
40.0000 mg | INTRAMUSCULAR | Status: AC | PRN
  Administered 2020-07-31: 40 mg via INTRA_ARTICULAR

## 2020-07-31 MED ORDER — LIDOCAINE HCL 1 % IJ SOLN
2.0000 mL | INTRAMUSCULAR | Status: AC | PRN
  Administered 2020-07-31: 2 mL

## 2020-07-31 MED ORDER — BUPIVACAINE HCL 0.5 % IJ SOLN
2.0000 mL | INTRAMUSCULAR | Status: AC | PRN
  Administered 2020-07-31: 2 mL via INTRA_ARTICULAR

## 2020-07-31 NOTE — Progress Notes (Signed)
Subjective: Patient is here for ultrasound-guided intra-articular left glenohumeral injection.  Pain from DJD.  Objective: Limited active range of motion.  Procedure: Ultrasound guided injection is preferred based studies that show increased duration, increased effect, greater accuracy, decreased procedural pain, increased response rate, and decreased cost with ultrasound guided versus blind injection.   Verbal informed consent obtained.  Time-out conducted.  Noted no overlying erythema, induration, or other signs of local infection. Ultrasound-guided left glenohumeral injection: After sterile prep with Betadine, injected 8 cc 1% lidocaine without epinephrine and 40 mg methylprednisolone using a 22-gauge spinal needle, passing the needle from posterior approach into the glenohumeral joint.  Injectate seen filling the joint capsule.  Not much improvement during the immediate anesthetic phase.

## 2020-07-31 NOTE — Progress Notes (Signed)
Office Visit Note   Patient: Angela Barber           Date of Birth: 1936/08/30           MRN: [id number] Visit Date: 07/31/2020              Requested by: Hoyt Koch, MD [address] [place],  [place] [address] PCP: Hoyt Koch, MD   Assessment & Plan: Visit Diagnoses:  1. Primary osteoarthritis, left shoulder   2. Unilateral primary osteoarthritis, right knee     Plan: Impression is left shoulder glenohumeral osteoarthritis and right knee advanced degenerative joint disease.  We injected the right knee with cortisone today.  We have referred her to Dr. Junius Roads for left shoulder cortisone injection.  We have also briefly discussed total joint arthroplasty, but the patient is very hesitant to proceed with this at any point in time due to a history of sepsis followed by death and a good friend as well as anesthesia complications to include memory loss and her sister.  She will follow up with [place] as needed.  Follow-Up Instructions: Return if symptoms worsen or fail to improve.   Orders:  Orders Placed This Encounter  Procedures  . US Guided Needle Placement   No orders of the defined types were placed in this encounter.     Procedures: Large Joint Inj: R knee on 07/31/2020 9:40 PM Indications: pain Details: 22 G needle  Arthrogram: No  Medications: 40 mg methylPREDNISolone acetate 40 MG/ML; 2 mL lidocaine 1 %; 2 mL bupivacaine 0.5 % Consent was given by the patient. Patient was prepped and draped in the usual sterile fashion.       Clinical Data: No additional findings.   Subjective: Chief Complaint  Patient presents with  . Left Shoulder - Pain  . Right Knee - Pain    HPI patient is a pleasant 84 year old female who comes in today with left shoulder and right knee pain.  History of advanced degenerative joint disease to both joints.  She was seen by [place] approximately 2 weeks ago for this where she wanted to proceed with cortisone injections  but was scheduled to have her Covid injection that day.  She comes in today for left shoulder and right knee injections.     Objective: Vital Signs: There were no vitals taken for this visit.    Ortho Exam stable right knee and left shoulder exams  Specialty Comments:  No specialty comments available.  Imaging: No new imaging   PMFS History: Patient Active Problem List   Diagnosis Date Noted  . Bilateral shoulder pain 06/29/2020  . Chronic diarrhea 06/28/2020  . Primary osteoarthritis of both knees 01/06/2020  . Urinary incontinence 10/14/2017  . Irregular heart beat 08/09/2016  . Symptomatic varicose veins of both lower extremities 07/25/2015  . Routine general medical examination at a health care facility 08/13/2014  . 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 Hyperlipidemia 05/24/2009  . Hearing loss 04/25/2008  . CLOSTRIDIUM DIFFICILE COLITIS, HX OF 10/23/2007   Past Medical History:  Diagnosis Date  . Anxiety   . C. difficile diarrhea 9/06  . Cochlear implant status   . Depression   . Diverticulosis   . Hearing loss of both ears   . HLD (hyperlipidemia)   . Hyperplastic colon polyp   . Insomnia   . Internal hemorrhoid   . Osteoarthritis, knee     Family History  Problem Relation Age of Onset  .  Ovarian cancer Mother   . Transient ischemic attack Mother   . Breast cancer Maternal Aunt        aunts  . Stroke Sister   . Anxiety disorder Daughter   . Autoimmune disease Daughter     Past Surgical History:  Procedure Laterality Date  . BUNIONECTOMY     right  . CHOLECYSTECTOMY N/A 07/25/2016   Procedure: LAPAROSCOPIC CHOLECYSTECTOMY;  Surgeon: Jackolyn Confer, MD;  Location: [facility];  Service: General;  Laterality: N/A;  Royann Shivers IMPLANT  July '13   right ear. Dr. Idelle Crouch  . ERCP N/A 07/31/2016   Procedure: ENDOSCOPIC RETROGRADE CHOLANGIOPANCREATOGRAPHY (ERCP);  Surgeon: Milus Banister, MD;  Location: [facility];  Service: Endoscopy;   Laterality: N/A;  . ROTATOR CUFF REPAIR Left 2012  . ROTATOR CUFF REPAIR Right 2007 & 2009  . TONSILLECTOMY    . VAGINAL HYSTERECTOMY     Social History   Occupational History  . Occupation: Product manager: RETIRED  Tobacco Use  . Smoking status: Former Research scientist (life sciences)  . Smokeless tobacco: Never Used  Vaping Use  . Vaping Use: Never used  Substance and Sexual Activity  . Alcohol use: Yes    Comment: 1 glass of wine a day  . Drug use: No  . Sexual activity: Not Currently

## 2020-08-06 DIAGNOSIS — H2513 Age-related nuclear cataract, bilateral: Secondary | ICD-10-CM | POA: Diagnosis not present

## 2020-08-06 DIAGNOSIS — H532 Diplopia: Secondary | ICD-10-CM | POA: Diagnosis not present

## 2020-08-06 DIAGNOSIS — H04123 Dry eye syndrome of bilateral lacrimal glands: Secondary | ICD-10-CM | POA: Diagnosis not present

## 2020-08-06 DIAGNOSIS — H40013 Open angle with borderline findings, low risk, bilateral: Secondary | ICD-10-CM | POA: Diagnosis not present

## 2020-08-09 DIAGNOSIS — M810 Age-related osteoporosis without current pathological fracture: Secondary | ICD-10-CM | POA: Diagnosis not present

## 2020-08-09 DIAGNOSIS — Z1231 Encounter for screening mammogram for malignant neoplasm of breast: Secondary | ICD-10-CM | POA: Diagnosis not present

## 2020-08-09 LAB — HM DEXA SCAN: HM Dexa Scan: -3.1

## 2020-08-23 ENCOUNTER — Encounter: Payer: Self-pay | Admitting: Internal Medicine

## 2020-10-05 ENCOUNTER — Telehealth: Payer: Self-pay | Admitting: Internal Medicine

## 2020-10-05 NOTE — Telephone Encounter (Signed)
Pt informed of below.  

## 2020-10-05 NOTE — Telephone Encounter (Signed)
Quarantine 5 days from symptom onset, then 5 days cannot leave home without mask. Can have virtual visit if desired. Otherwise monitor breathing and if problems with breathing go to urgent care or ER. Can use over the counter for symptoms.

## 2020-10-05 NOTE — Telephone Encounter (Signed)
    Patient calling to report she tested positive last night (home test) She has sore throat, cough, fever She is hearing impaired, you must speak loudly  Seeking advice

## 2020-11-30 DIAGNOSIS — H903 Sensorineural hearing loss, bilateral: Secondary | ICD-10-CM | POA: Diagnosis not present

## 2020-12-05 DIAGNOSIS — H04123 Dry eye syndrome of bilateral lacrimal glands: Secondary | ICD-10-CM | POA: Diagnosis not present

## 2020-12-05 DIAGNOSIS — H532 Diplopia: Secondary | ICD-10-CM | POA: Diagnosis not present

## 2020-12-05 DIAGNOSIS — H40013 Open angle with borderline findings, low risk, bilateral: Secondary | ICD-10-CM | POA: Diagnosis not present

## 2020-12-05 DIAGNOSIS — H2513 Age-related nuclear cataract, bilateral: Secondary | ICD-10-CM | POA: Diagnosis not present

## 2020-12-14 ENCOUNTER — Encounter: Payer: Self-pay | Admitting: Internal Medicine

## 2020-12-14 ENCOUNTER — Other Ambulatory Visit: Payer: Self-pay

## 2020-12-14 ENCOUNTER — Ambulatory Visit (INDEPENDENT_AMBULATORY_CARE_PROVIDER_SITE_OTHER): Payer: Medicare PPO | Admitting: Internal Medicine

## 2020-12-14 VITALS — BP 116/82 | HR 104 | Temp 98.2°F | Resp 18 | Ht 63.0 in | Wt 147.0 lb

## 2020-12-14 DIAGNOSIS — M25512 Pain in left shoulder: Secondary | ICD-10-CM

## 2020-12-14 DIAGNOSIS — G8929 Other chronic pain: Secondary | ICD-10-CM

## 2020-12-14 DIAGNOSIS — M25561 Pain in right knee: Secondary | ICD-10-CM | POA: Diagnosis not present

## 2020-12-14 DIAGNOSIS — N3946 Mixed incontinence: Secondary | ICD-10-CM | POA: Diagnosis not present

## 2020-12-14 DIAGNOSIS — M170 Bilateral primary osteoarthritis of knee: Secondary | ICD-10-CM | POA: Diagnosis not present

## 2020-12-14 DIAGNOSIS — M25511 Pain in right shoulder: Secondary | ICD-10-CM | POA: Diagnosis not present

## 2020-12-14 MED ORDER — GEMTESA 75 MG PO TABS: 75.0000 mg | ORAL_TABLET | Freq: Every day | ORAL | 1 refills | Status: DC

## 2020-12-14 NOTE — Assessment & Plan Note (Signed)
Stop myrbetriq and start gemtesa.

## 2020-12-14 NOTE — Progress Notes (Signed)
   Subjective:   Patient ID: Angela Barber, female    DOB: [birth year], 85 y.o.   MRN: [id number]  HPI The patient is an 85 YO female coming in for right knee pain (causing severe pain, she is unable to do much of anything, was hoping to start traveling this year, had an injection in knee this fall which did not last well, lasted about 3-4 days) and left shoulder pain (prior rotator cuff repair, she has poor ROM, constant pain, about 7-8/10, denies numbness or weakness in the left arm, overall worsening over the years, injection in this this fall and did help for a month or so) and bladder frequency (taking myrbetriq and this is not doing well, she has taken for some time, would like to try something else if possible).   Review of Systems  Constitutional: Positive for activity change.  HENT: Negative.   Eyes: Negative.   Respiratory: Negative for cough, chest tightness and shortness of breath.   Cardiovascular: Negative for chest pain, palpitations and leg swelling.  Gastrointestinal: Negative for abdominal distention, abdominal pain, constipation, diarrhea, nausea and vomiting.  Genitourinary: Positive for frequency and urgency.  Musculoskeletal: Positive for arthralgias, gait problem, joint swelling and myalgias.  Skin: Negative.   Psychiatric/Behavioral: Negative.     Objective:  Physical Exam Constitutional:      Appearance: She is well-developed.  HENT:     Head: Normocephalic and atraumatic.  Cardiovascular:     Rate and Rhythm: Normal rate and regular rhythm.  Pulmonary:     Effort: Pulmonary effort is normal. No respiratory distress.     Breath sounds: Normal breath sounds. No wheezing or rales.  Abdominal:     General: Bowel sounds are normal. There is no distension.     Palpations: Abdomen is soft.     Tenderness: There is no abdominal tenderness. There is no rebound.  Musculoskeletal:        General: Tenderness present.     Cervical back: Normal range of motion.      Comments: Right knee swollen and painful, left shoulder with reduced ROM  Skin:    General: Skin is warm and dry.  Neurological:     Mental Status: She is alert and oriented to person, place, and time.     Coordination: Coordination abnormal.     Comments: Slow to rise but steady gait     Vitals:   12/14/20 1537  BP: 116/82  Pulse: (!) 104  Resp: 18  Temp: 98.2 F (36.8 C)  TempSrc: Oral  SpO2: 96%  Weight: 147 lb (66.7 kg)  Height: 5\' 3"  (1.6 m)    This visit occurred during the SARS-CoV-2 public health emergency.  Safety protocols were in place, including screening questions prior to the visit, additional usage of staff PPE, and extensive cleaning of exam room while observing appropriate contact time as indicated for disinfecting solutions.   Assessment & Plan:

## 2020-12-14 NOTE — Assessment & Plan Note (Signed)
Referral to orthopedics 

## 2020-12-14 NOTE — Patient Instructions (Addendum)
We will get you in with Dr. Maureen Ralphs for the knee and the shoulder.  We have sent in gemtesa to replace myrbetriq to try for the bladder.

## 2020-12-14 NOTE — Assessment & Plan Note (Signed)
Referral to orthopedics and she would like to see specific provider who all her friends have used and been happy with.

## 2020-12-26 DIAGNOSIS — L821 Other seborrheic keratosis: Secondary | ICD-10-CM | POA: Diagnosis not present

## 2020-12-26 DIAGNOSIS — L814 Other melanin hyperpigmentation: Secondary | ICD-10-CM | POA: Diagnosis not present

## 2020-12-26 DIAGNOSIS — D225 Melanocytic nevi of trunk: Secondary | ICD-10-CM | POA: Diagnosis not present

## 2020-12-26 DIAGNOSIS — L570 Actinic keratosis: Secondary | ICD-10-CM | POA: Diagnosis not present

## 2020-12-26 DIAGNOSIS — D224 Melanocytic nevi of scalp and neck: Secondary | ICD-10-CM | POA: Diagnosis not present

## 2020-12-26 DIAGNOSIS — L578 Other skin changes due to chronic exposure to nonionizing radiation: Secondary | ICD-10-CM | POA: Diagnosis not present

## 2020-12-28 DIAGNOSIS — M170 Bilateral primary osteoarthritis of knee: Secondary | ICD-10-CM | POA: Diagnosis not present

## 2020-12-28 DIAGNOSIS — M1711 Unilateral primary osteoarthritis, right knee: Secondary | ICD-10-CM | POA: Diagnosis not present

## 2021-01-16 DIAGNOSIS — H2513 Age-related nuclear cataract, bilateral: Secondary | ICD-10-CM | POA: Diagnosis not present

## 2021-01-16 DIAGNOSIS — H04123 Dry eye syndrome of bilateral lacrimal glands: Secondary | ICD-10-CM | POA: Diagnosis not present

## 2021-01-16 DIAGNOSIS — H532 Diplopia: Secondary | ICD-10-CM | POA: Diagnosis not present

## 2021-01-16 DIAGNOSIS — H40013 Open angle with borderline findings, low risk, bilateral: Secondary | ICD-10-CM | POA: Diagnosis not present

## 2021-01-30 ENCOUNTER — Other Ambulatory Visit: Payer: Self-pay | Admitting: Internal Medicine

## 2021-02-11 ENCOUNTER — Other Ambulatory Visit: Payer: Self-pay

## 2021-02-11 ENCOUNTER — Ambulatory Visit (INDEPENDENT_AMBULATORY_CARE_PROVIDER_SITE_OTHER): Payer: Medicare PPO

## 2021-02-11 VITALS — BP 116/70 | HR 86 | Temp 97.6°F | Resp 16 | Ht 63.0 in | Wt 146.8 lb

## 2021-02-11 DIAGNOSIS — Z0000 Encounter for general adult medical examination without abnormal findings: Secondary | ICD-10-CM | POA: Diagnosis not present

## 2021-02-11 DIAGNOSIS — G8929 Other chronic pain: Secondary | ICD-10-CM | POA: Diagnosis not present

## 2021-02-11 DIAGNOSIS — E663 Overweight: Secondary | ICD-10-CM | POA: Diagnosis not present

## 2021-02-11 DIAGNOSIS — M25561 Pain in right knee: Secondary | ICD-10-CM

## 2021-02-11 NOTE — Patient Instructions (Addendum)
Angela Barber , Thank you for taking time to come for your Medicare Wellness Visit. I appreciate your ongoing commitment to your health goals. Please review the following plan we discussed and let me know if I can assist you in the future.   Screening recommendations/referrals: Colonoscopy: no repeat due to age 85: 08/09/2020; due every year Bone Density: 08/09/2020; due every 2 years Recommended yearly ophthalmology/optometry visit for glaucoma screening and checkup Recommended yearly dental visit for hygiene and checkup  Vaccinations: Influenza vaccine: 06/29/2020 Pneumococcal vaccine: 02/19/2010, 07/31/2014 Tdap vaccine: 02/19/2010; due every 10 years (overdue) Shingles vaccine: 08/31/2019; need second dose Covid-19: 10/27/2019, 11/22/2019, 10/26/201, 12/27/2020  Advanced directives: Please bring a copy of your health care power of attorney and living will to the office at your convenience.  Conditions/risks identified: Yes; Reviewed health maintenance screenings with patient today and relevant education, vaccines, and/or referrals were provided. Please continue to do your personal lifestyle choices by: daily care of teeth and gums, regular physical activity (goal should be 5 days a week for 30 minutes), eat a healthy diet, avoid tobacco and drug use, limiting any alcohol intake, taking a low-dose aspirin (if not allergic or have been advised by your provider otherwise) and taking vitamins and minerals as recommended by your provider. Continue doing brain stimulating activities (puzzles, reading, adult coloring books, staying active) to keep memory sharp. Continue to eat heart healthy diet (full of fruits, vegetables, whole grains, lean protein, water--limit salt, fat, and sugar intake) and increase physical activity as tolerated.  Next appointment: Please schedule your next Medicare Wellness Visit with your Nurse Health Advisor in 1 year by calling [phone].   Preventive Care 60 Years and  Older, Female Preventive care refers to lifestyle choices and visits with your health care provider that can promote health and wellness. What does preventive care include?  A yearly physical exam. This is also called an annual well check.  Dental exams once or twice a year.  Routine eye exams. Ask your health care provider how often you should have your eyes checked.  Personal lifestyle choices, including:  Daily care of your teeth and gums.  Regular physical activity.  Eating a healthy diet.  Avoiding tobacco and drug use.  Limiting alcohol use.  Practicing safe sex.  Taking low-dose aspirin every day.  Taking vitamin and mineral supplements as recommended by your health care provider. What happens during an annual well check? The services and screenings done by your health care provider during your annual well check will depend on your age, overall health, lifestyle risk factors, and family history of disease. Counseling  Your health care provider may ask you questions about your:  Alcohol use.  Tobacco use.  Drug use.  Emotional well-being.  Home and relationship well-being.  Sexual activity.  Eating habits.  History of falls.  Memory and ability to understand (cognition).  Work and work Statistician.  Reproductive health. Screening  You may have the following tests or measurements:  Height, weight, and BMI.  Blood pressure.  Lipid and cholesterol levels. These may be checked every 5 years, or more frequently if you are over 61 years old.  Skin check.  Lung cancer screening. You may have this screening every year starting at age 19 if you have a 30-pack-year history of smoking and currently smoke or have quit within the past 15 years.  Fecal occult blood test (FOBT) of the stool. You may have this test every year starting at age 7.  Flexible sigmoidoscopy or  colonoscopy. You may have a sigmoidoscopy every 5 years or a colonoscopy every 10 years  starting at age 78.  Hepatitis C blood test.  Hepatitis B blood test.  Sexually transmitted disease (STD) testing.  Diabetes screening. This is done by checking your blood sugar (glucose) after you have not eaten for a while (fasting). You may have this done every 1-3 years.  Bone density scan. This is done to screen for osteoporosis. You may have this done starting at age 85.  Mammogram. This may be done every 1-2 years. Talk to your health care provider about how often you should have regular mammograms. Talk with your health care provider about your test results, treatment options, and if necessary, the need for more tests. Vaccines  Your health care provider may recommend certain vaccines, such as:  Influenza vaccine. This is recommended every year.  Tetanus, diphtheria, and acellular pertussis (Tdap, Td) vaccine. You may need a Td booster every 10 years.  Zoster vaccine. You may need this after age 54.  Pneumococcal 13-valent conjugate (PCV13) vaccine. One dose is recommended after age 84.  Pneumococcal polysaccharide (PPSV23) vaccine. One dose is recommended after age 85. Talk to your health care provider about which screenings and vaccines you need and how often you need them. This information is not intended to replace advice given to you by your health care provider. Make sure you discuss any questions you have with your health care provider. Document Released: 10/05/2015 Document Revised: 05/28/2016 Document Reviewed: 07/10/2015 Elsevier Interactive Patient Education  2017 [place] Prevention in the Home Falls can cause injuries. They can happen to people of all ages. There are many things you can do to make your home safe and to help prevent falls. What can I do on the outside of my home?  Regularly fix the edges of walkways and driveways and fix any cracks.  Remove anything that might make you trip as you walk through a door, such as a raised step or  threshold.  Trim any bushes or trees on the path to your home.  Use bright outdoor lighting.  Clear any walking paths of anything that might make someone trip, such as rocks or tools.  Regularly check to see if handrails are loose or broken. Make sure that both sides of any steps have handrails.  Any raised decks and porches should have guardrails on the edges.  Have any leaves, snow, or ice cleared regularly.  Use sand or salt on walking paths during winter.  Clean up any spills in your garage right away. This includes oil or grease spills. What can I do in the bathroom?  Use night lights.  Install grab bars by the toilet and in the tub and shower. Do not use towel bars as grab bars.  Use non-skid mats or decals in the tub or shower.  If you need to sit down in the shower, use a plastic, non-slip stool.  Keep the floor dry. Clean up any water that spills on the floor as soon as it happens.  Remove soap buildup in the tub or shower regularly.  Attach bath mats securely with double-sided non-slip rug tape.  Do not have throw rugs and other things on the floor that can make you trip. What can I do in the bedroom?  Use night lights.  Make sure that you have a light by your bed that is easy to reach.  Do not use any sheets or blankets that are too  big for your bed. They should not hang down onto the floor.  Have a firm chair that has side arms. You can use this for support while you get dressed.  Do not have throw rugs and other things on the floor that can make you trip. What can I do in the kitchen?  Clean up any spills right away.  Avoid walking on wet floors.  Keep items that you use a lot in easy-to-reach places.  If you need to reach something above you, use a strong step stool that has a grab bar.  Keep electrical cords out of the way.  Do not use floor polish or wax that makes floors slippery. If you must use wax, use non-skid floor wax.  Do not have  throw rugs and other things on the floor that can make you trip. What can I do with my stairs?  Do not leave any items on the stairs.  Make sure that there are handrails on both sides of the stairs and use them. Fix handrails that are broken or loose. Make sure that handrails are as long as the stairways.  Check any carpeting to make sure that it is firmly attached to the stairs. Fix any carpet that is loose or worn.  Avoid having throw rugs at the top or bottom of the stairs. If you do have throw rugs, attach them to the floor with carpet tape.  Make sure that you have a light switch at the top of the stairs and the bottom of the stairs. If you do not have them, ask someone to add them for you. What else can I do to help prevent falls?  Wear shoes that:  Do not have high heels.  Have rubber bottoms.  Are comfortable and fit you well.  Are closed at the toe. Do not wear sandals.  If you use a stepladder:  Make sure that it is fully opened. Do not climb a closed stepladder.  Make sure that both sides of the stepladder are locked into place.  Ask someone to hold it for you, if possible.  Clearly mark and make sure that you can see:  Any grab bars or handrails.  First and last steps.  Where the edge of each step is.  Use tools that help you move around (mobility aids) if they are needed. These include:  Canes.  Walkers.  Scooters.  Crutches.  Turn on the lights when you go into a dark area. Replace any light bulbs as soon as they burn out.  Set up your furniture so you have a clear path. Avoid moving your furniture around.  If any of your floors are uneven, fix them.  If there are any pets around you, be aware of where they are.  Review your medicines with your doctor. Some medicines can make you feel dizzy. This can increase your chance of falling. Ask your doctor what other things that you can do to help prevent falls. This information is not intended to  replace advice given to you by your health care provider. Make sure you discuss any questions you have with your health care provider. Document Released: 07/05/2009 Document Revised: 02/14/2016 Document Reviewed: 10/13/2014 Elsevier Interactive Patient Education  2017 [employer].

## 2021-02-11 NOTE — Addendum Note (Signed)
Addended by: Sheral Flow on: 02/11/2021 10:33 AM   Modules accepted: Orders

## 2021-02-11 NOTE — Progress Notes (Addendum)
Subjective:   Angela Barber is a 85 y.o. female who presents for Medicare Annual (Subsequent) preventive examination.  Review of Systems    No ROS. Medicare Wellness Visit. Additional risk factors are reflected in social history. Cardiac Risk Factors include: advanced age (>29men, >65 women);family history of premature cardiovascular disease     Objective:    Today's Vitals   02/11/21 0814 02/11/21 0847  BP:  116/70  Pulse:  86  Resp:  16  Temp:  97.6 F (36.4 C)  SpO2:  97%  Weight:  146 lb 12.8 oz (66.6 kg)  Height:  5\' 3"  (1.6 m)  PainSc: 0-No pain 0-No pain   Body mass index is 26 kg/m.  Advanced Directives 02/11/2021 10/29/2018 10/28/2017 07/31/2016 07/24/2016 07/22/2016 03/20/2016  Does Patient Have a Medical Advance Directive? Yes Yes No;Yes No No Yes No  Type of Advance Directive Living will;Healthcare Power of [employer] Power of [place];Living will Healthcare Power of [place];Living will - - Living will;Healthcare Power of Attorney -  Does patient want to make changes to medical advance directive? No - Patient declined - - - - - -  Copy of Healthcare Power of Attorney in Chart? No - copy requested No - copy requested - - - - -  Would patient like information on creating a medical advance directive? - - - - - - No - patient declined information    Current Medications (verified) Outpatient Encounter Medications as of 02/11/2021  Medication Sig  . acetaminophen (TYLENOL) 650 MG CR tablet Take 650 mg by mouth every 8 (eight) hours as needed for pain.  . bifidobacterium infantis (ALIGN) capsule Take 1 capsule by mouth daily as needed.   . cholestyramine (QUESTRAN) 4 g packet TAKE CONTENTS OF 1 PACKET (DISSOLVED AS DIRECTED) BY MOUTH DAILY  . diclofenac Sodium (VOLTAREN) 1 % GEL Apply topically.  . diphenhydramine-acetaminophen (TYLENOL PM) 25-500 MG TABS tablet Take 1 tablet by mouth at bedtime as needed.  . fluticasone (FLONASE) 50 MCG/ACT nasal spray Place 2  sprays into both nostrils daily. (Patient taking differently: Place 2 sprays into both nostrils daily as needed.)  . triamcinolone cream (KENALOG) 0.1 %   . Vibegron (GEMTESA) 75 MG TABS Take 75 mg by mouth daily.   No facility-administered encounter medications on file as of 02/11/2021.    Allergies (verified) Other   History: Past Medical History:  Diagnosis Date  . Anxiety   . C. difficile diarrhea 9/06  . Cochlear implant status   . Depression   . Diverticulosis   . Hearing loss of both ears   . HLD (hyperlipidemia)   . Hyperplastic colon polyp   . Insomnia   . Internal hemorrhoid   . Osteoarthritis, knee    Past Surgical History:  Procedure Laterality Date  . BUNIONECTOMY     right  . CHOLECYSTECTOMY N/A 07/25/2016   Procedure: LAPAROSCOPIC CHOLECYSTECTOMY;  Surgeon: Avel Peace, MD;  Location: [facility];  Service: General;  Laterality: N/A;  Malachy Moan IMPLANT  July '13   right ear. Dr. Lenoria Farrier  . ERCP N/A 07/31/2016   Procedure: ENDOSCOPIC RETROGRADE CHOLANGIOPANCREATOGRAPHY (ERCP);  Surgeon: Rachael Fee, MD;  Location: [facility];  Service: Endoscopy;  Laterality: N/A;  . ROTATOR CUFF REPAIR Left 2012  . ROTATOR CUFF REPAIR Right 2007 & 2009  . TONSILLECTOMY    . VAGINAL HYSTERECTOMY     Family History  Problem Relation Age of Onset  . Ovarian cancer Mother   . Transient  ischemic attack Mother   . Breast cancer Maternal Aunt        aunts  . Stroke Sister   . Anxiety disorder Daughter   . Autoimmune disease Daughter    Social History   Socioeconomic History  . Marital status: Widowed    Spouse name: Not on file  . Number of children: 2  . Years of education: Master's  . Highest education level: Not on file  Occupational History  . Occupation: Product manager: RETIRED  Tobacco Use  . Smoking status: Former Research scientist (life sciences)  . Smokeless tobacco: Never Used  Vaping Use  . Vaping Use: Never used  Substance and Sexual Activity  . Alcohol use: Yes     Comment: 1 glass of wine a day  . Drug use: No  . Sexual activity: Not Currently  Other Topics Concern  . Not on file  Social History Narrative   Patient is widowed and lives alone.   Patient has two adult children.   Patient is retired.   Patient has a Scientist, water quality.   Patient is right-handed.   Patient drinks two cups of coffee daily.   Social Determinants of Health   Financial Resource Strain: Low Risk   . Difficulty of Paying Living Expenses: Not hard at all  Food Insecurity: No Food Insecurity  . Worried About Charity fundraiser in the Last Year: Never true  . Ran Out of Food in the Last Year: Never true  Transportation Needs: No Transportation Needs  . Lack of Transportation (Medical): No  . Lack of Transportation (Non-Medical): No  Physical Activity: Sufficiently Active  . Days of Exercise per Week: 5 days  . Minutes of Exercise per Session: 30 min  Stress: No Stress Concern Present  . Feeling of Stress : Not at all  Social Connections: Moderately Integrated  . Frequency of Communication with Friends and Family: More than three times a week  . Frequency of Social Gatherings with Friends and Family: More than three times a week  . Attends Religious Services: More than 4 times per year  . Active Member of Clubs or Organizations: Yes  . Attends Archivist Meetings: More than 4 times per year  . Marital Status: Widowed    Tobacco Counseling Counseling given: Not Answered   Clinical Intake:  Pre-visit preparation completed: Yes  Pain : No/denies pain Pain Score: 0-No pain     BMI - recorded: 26 Nutritional Status: BMI 25 -29 Overweight Nutritional Risks: None Diabetes: No  How often do you need to have someone help you when you read instructions, pamphlets, or other written materials from your doctor or pharmacy?: 1 - Never What is the last grade level you completed in school?: Master's Degree  Diabetic? no  Interpreter Needed?:  No  Information entered by :: Earnie Larsson, LPN   Activities of Daily Living In your present state of health, do you have any difficulty performing the following activities: 02/11/2021 06/29/2020  Hearing? Tempie Donning  Vision? N N  Difficulty concentrating or making decisions? N N  Walking or climbing stairs? N N  Dressing or bathing? N N  Doing errands, shopping? N N  Preparing Food and eating ? N -  Using the Toilet? N -  In the past six months, have you accidently leaked urine? Y -  Do you have problems with loss of bowel control? N -  Managing your Medications? N -  Managing your Finances? N -  Housekeeping  or managing your Housekeeping? N -  Some recent data might be hidden    Patient Care Team: Hoyt Koch, MD as PCP - General (Internal Medicine) [facility] as Consulting Physician (Ophthalmology)  Indicate any recent Medical Services you may have received from other than [facility] providers in the past year (date may be approximate).     Assessment:   This is a routine wellness examination for Tereza.  Hearing/Vision screen No exam data present  Dietary issues and exercise activities discussed: Current Exercise Habits: Home exercise routine, Type of exercise: walking, Time (Minutes): 30, Frequency (Times/Week): 5, Weekly Exercise (Minutes/Week): 150, Intensity: Mild, Exercise limited by: orthopedic condition(s)  Goals Addressed            This Visit's Progress   . Patient Stated       This is "Repair [place]iar[place] Year."      Depression Screen PHQ 2/9 Scores 02/11/2021 01/06/2020 10/29/2018 03/27/2016  PHQ - 2 Score 0 0 0 0    Fall Risk Fall Risk  02/11/2021 01/06/2020 01/06/2020 10/29/2018 12/11/2017  Falls in the past year? 0 0 0 0 No  Number falls in past yr: 0 - 0 - -  Injury with Fall? 0 - 0 - -  Risk for fall due to : No Fall Risks - - - -  Follow up Falls evaluation completed - - - -    FALL RISK PREVENTION PERTAINING TO THE HOME:  Any  stairs in or around the home? No  If so, are there any without handrails? No  Home free of loose throw rugs in walkways, pet beds, electrical cords, etc? Yes  Adequate lighting in your home to reduce risk of falls? Yes   ASSISTIVE DEVICES UTILIZED TO PREVENT FALLS:  Life alert? No  Use of a cane, walker or w/c? No  Grab bars in the bathroom? Yes  Shower chair or bench in shower? No  Elevated toilet seat or a handicapped toilet? No   TIMED UP AND GO:  Was the test performed? No .  Length of time to ambulate 10 feet: 0 sec.   Gait steady and fast without use of assistive device  Cognitive Function: Normal cognitive status assessed by direct observation by this Nurse Health Advisor. No abnormalities found.   MMSE - Mini Mental State Exam 10/29/2018  Orientation to time 5  Orientation to Place 5  Registration 3  Attention/ Calculation 5  Recall 2  Language- name 2 objects 2  Language- repeat 1  Language- follow 3 step command 3  Language- read & follow direction 1  Write a sentence 1  Copy design 1  Total score 29        Immunizations Immunization History  Administered Date(s) Administered  . Fluad Quad(high Dose 65+) 06/23/2019, 06/29/2020  . H1N1 08/28/2008  . Influenza Split 09/07/2012  . Influenza Whole 09/04/2010  . Influenza, High Dose Seasonal PF 08/15/2013, 06/19/2016, 10/06/2017, 08/30/2018  . Influenza,inj,Quad PF,6+ Mos 08/10/2014  . Influenza-Unspecified 06/23/2019  . PFIZER(Purple Top)SARS-COV-2 Vaccination 10/27/2019, 11/22/2019, 07/17/2020, 01/16/2021  . Pneumococcal Conjugate-13 08/10/2014  . Pneumococcal Polysaccharide-23 05/24/2009, 02/19/2010  . Td 05/24/2009, 02/19/2010  . Zoster 09/25/2014  . Zoster Recombinat (Shingrix) 08/31/2019    TDAP status: Due, Education has been provided regarding the importance of this vaccine. Advised may receive this vaccine at local pharmacy or Health Dept. Aware to provide a copy of the vaccination record if  obtained from local pharmacy or Health Dept. Verbalized acceptance and understanding.  Flu Vaccine status: Up to date  Pneumococcal vaccine status: Up to date  Covid-19 vaccine status: Completed vaccines  Qualifies for Shingles Vaccine? Yes   Zostavax completed Yes   Shingrix Completed?: Yes  Screening Tests Health Maintenance  Topic Date Due  . TETANUS/TDAP  02/20/2020  . INFLUENZA VACCINE  04/22/2021  . DEXA SCAN  Completed  . COVID-19 Vaccine  Completed  . PNA vac Low Risk Adult  Completed  . HPV VACCINES  Aged Out    Health Maintenance  Health Maintenance Due  Topic Date Due  . TETANUS/TDAP  02/20/2020    Colorectal cancer screening: No longer required.   Mammogram status: Completed 08/09/2020. Repeat every year  Bone Density status: Completed 08/09/2020. Results reflect: Bone density results: OSTEOPOROSIS. Repeat every 2 years.  Lung Cancer Screening: (Low Dose CT Chest recommended if Age 77-80 years, 30 pack-year currently smoking OR have quit w/in 15years.) does not qualify.   Lung Cancer Screening Referral: no  Additional Screening:  Hepatitis C Screening: does not qualify; Completed no  Vision Screening: Recommended annual ophthalmology exams for early detection of glaucoma and other disorders of the eye. Is the patient up to date with their annual eye exam?  Yes  Who is the provider or what is the name of the office in which the patient attends annual eye exams? [facility] If pt is not established with a provider, would they like to be referred to a provider to establish care? No .   Dental Screening: Recommended annual dental exams for proper oral hygiene  Community Resource Referral / Chronic Care Management: CRR required this visit?  Yes   CCM required this visit?  No      Plan:     I have personally reviewed and noted the following in the patient's chart:   . Medical and social history . Use of alcohol, tobacco or illicit drugs   . Current medications and supplements including opioid prescriptions.  . Functional ability and status . Nutritional status . Physical activity . Advanced directives . List of other physicians . Hospitalizations, surgeries, and ER visits in previous 12 months . Vitals . Screenings to include cognitive, depression, and falls . Referrals and appointments  In addition, I have reviewed and discussed with patient certain preventive protocols, quality metrics, and best practice recommendations. A written personalized care plan for preventive services as well as general preventive health recommendations were provided to patient.     Sheral Flow, LPN   03/18/349   Nurse Notes:  Referral to [facility] for weight management. [facility] Referral to help find dental insurance.

## 2021-02-12 ENCOUNTER — Other Ambulatory Visit: Payer: Self-pay | Admitting: Internal Medicine

## 2021-02-12 DIAGNOSIS — E663 Overweight: Secondary | ICD-10-CM

## 2021-02-14 DIAGNOSIS — H2511 Age-related nuclear cataract, right eye: Secondary | ICD-10-CM | POA: Diagnosis not present

## 2021-02-21 DIAGNOSIS — H00025 Hordeolum internum left lower eyelid: Secondary | ICD-10-CM | POA: Diagnosis not present

## 2021-03-14 DIAGNOSIS — M1711 Unilateral primary osteoarthritis, right knee: Secondary | ICD-10-CM | POA: Diagnosis not present

## 2021-03-22 ENCOUNTER — Telehealth: Payer: Self-pay | Admitting: Internal Medicine

## 2021-03-22 NOTE — Telephone Encounter (Signed)
Noted  

## 2021-03-22 NOTE — Telephone Encounter (Signed)
Type of form received :Clearance   Additional comments:   Received by:  fax  Form should be Faxed to: [phone] Attn: Claiborne Billings  Form should be mailed to:  N/A  Is patient requesting call for pickup:  N   Form placed in the Provider's box.  *Attach charge sheet.  Provider will determine charge.*  Was patient informed of  7-10 business day turn around (Y/N)? N

## 2021-04-23 NOTE — H&P (Signed)
TOTAL KNEE ADMISSION H&P  Patient is being admitted for right total knee arthroplasty.  Subjective:  Chief Complaint: Right knee pain.  HPI: Angela Barber, 85 y.o. female has a history of pain and functional disability in the right knee due to arthritis and has failed non-surgical conservative treatments for greater than 12 weeks to include corticosteriod injections and activity modification. Onset of symptoms was gradual, starting >10 years ago with gradually worsening course since that time. The patient noted no past surgery on the right knee.  Patient currently rates pain in the right knee at 8 out of 10 with activity. Patient has night pain, worsening of pain with activity and weight bearing, pain with passive range of motion, and instability . Patient has evidence of  bone-on-bone arthritis in the lateral compartment of the right knee with slight valgus deformity and significant patellofemoral narrowing  by imaging studies. There is no active infection.  Patient Active Problem List   Diagnosis Date Noted   Bilateral shoulder pain 06/29/2020   Chronic diarrhea 06/28/2020   Primary osteoarthritis of both knees 01/06/2020   Urinary incontinence 10/14/2017   Irregular heart beat 08/09/2016   Symptomatic varicose veins of both lower extremities 07/25/2015   Routine general medical examination at a health care facility 08/13/2014   Insomnia due to psychological stress 04/04/2014   Hyperlipidemia 05/24/2009   Hearing loss 04/25/2008   CLOSTRIDIUM DIFFICILE COLITIS, HX OF 10/23/2007    Past Medical History:  Diagnosis Date   Anxiety    C. difficile diarrhea 9/06   Cochlear implant status    Depression    Diverticulosis    Hearing loss of both ears    HLD (hyperlipidemia)    Hyperplastic colon polyp    Insomnia    Internal hemorrhoid    Osteoarthritis, knee     Past Surgical History:  Procedure Laterality Date   BUNIONECTOMY     right   CHOLECYSTECTOMY N/A 07/25/2016    Procedure: LAPAROSCOPIC CHOLECYSTECTOMY;  Surgeon: Jackolyn Confer, MD;  Location: [facility];  Service: General;  Laterality: N/A;   COCHLEAR IMPLANT  July '13   right ear. Dr. Idelle Crouch   ERCP N/A 07/31/2016   Procedure: ENDOSCOPIC RETROGRADE CHOLANGIOPANCREATOGRAPHY (ERCP);  Surgeon: Milus Banister, MD;  Location: [facility];  Service: Endoscopy;  Laterality: N/A;   ROTATOR CUFF REPAIR Left 2012   ROTATOR CUFF REPAIR Right 2007 & 2009   TONSILLECTOMY     VAGINAL HYSTERECTOMY      Prior to Admission medications   Medication Sig Start Date End Date Taking? Authorizing Provider  acetaminophen (TYLENOL) 650 MG CR tablet Take 650 mg by mouth every 8 (eight) hours as needed for pain.    [provider]  bifidobacterium infantis (ALIGN) capsule Take 1 capsule by mouth daily as needed.     [provider]  cholestyramine (QUESTRAN) 4 g packet TAKE CONTENTS OF 1 PACKET (DISSOLVED AS DIRECTED) BY MOUTH DAILY 01/30/21   Hoyt Koch, MD  diclofenac Sodium (VOLTAREN) 1 % GEL Apply topically. 05/16/16   [provider]  diphenhydramine-acetaminophen (TYLENOL PM) 25-500 MG TABS tablet Take 1 tablet by mouth at bedtime as needed.    [provider]  fluticasone (FLONASE) 50 MCG/ACT nasal spray Place 2 sprays into both nostrils daily. Patient taking differently: Place 2 sprays into both nostrils daily as needed. 10/08/18   Marrian Salvage, FNP  triamcinolone cream (KENALOG) 0.1 %  04/30/20   [provider]  Vibegron (GEMTESA) 75 MG  TABS Take 75 mg by mouth daily. 12/14/20   Hoyt Koch, MD    Allergies  Allergen Reactions   Other     Egg plant    Social History   Socioeconomic History   Marital status: Widowed    Spouse name: Not on file   Number of children: 2   Years of education: Master's   Highest education level: Not on file  Occupational History   Occupation: Product manager: RETIRED  Tobacco Use   Smoking status:  Former   Smokeless tobacco: Never  Scientific laboratory technician Use: Never used  Substance and Sexual Activity   Alcohol use: Yes    Comment: 1 glass of wine a day   Drug use: No   Sexual activity: Not Currently  Other Topics Concern   Not on file  Social History Narrative   Patient is widowed and lives alone.   Patient has two adult children.   Patient is retired.   Patient has a Scientist, water quality.   Patient is right-handed.   Patient drinks two cups of coffee daily.   Social Determinants of Health   Financial Resource Strain: Low Risk    Difficulty of Paying Living Expenses: Not hard at all  Food Insecurity: No Food Insecurity   Worried About Charity fundraiser in the Last Year: Never true   [place] in the Last Year: Never true  Transportation Needs: No Transportation Needs   Lack of Transportation (Medical): No   Lack of Transportation (Non-Medical): No  Physical Activity: Sufficiently Active   Days of Exercise per Week: 5 days   Minutes of Exercise per Session: 30 min  Stress: No Stress Concern Present   Feeling of Stress : Not at all  Social Connections: Moderately Integrated   Frequency of Communication with Friends and Family: More than three times a week   Frequency of Social Gatherings with Friends and Family: More than three times a week   Attends Religious Services: More than 4 times per year   Active Member of [employer] or Organizations: Yes   Attends Archivist Meetings: More than 4 times per year   Marital Status: Widowed  Human resources officer Violence: Not on file    Tobacco Use: Medium Risk   Smoking Tobacco Use: Former   Smokeless Tobacco Use: Never   Social History   Substance and Sexual Activity  Alcohol Use Yes   Comment: 1 glass of wine a day    Family History  Problem Relation Age of Onset   Ovarian cancer Mother    Transient ischemic attack Mother    Breast cancer Maternal Aunt        aunts   Stroke Sister    Anxiety disorder  Daughter    Autoimmune disease Daughter     Review of Systems  Constitutional:  Negative for chills and fever.  HENT:  Negative for congestion, sore throat and tinnitus.   Eyes:  Negative for double vision, photophobia and pain.  Respiratory:  Negative for cough, shortness of breath and wheezing.   Cardiovascular:  Negative for chest pain, palpitations and orthopnea.  Gastrointestinal:  Negative for heartburn, nausea and vomiting.  Genitourinary:  Negative for dysuria, frequency and urgency.  Musculoskeletal:  Positive for joint pain.  Neurological:  Negative for dizziness, weakness and headaches.   Objective:  Physical Exam: Well nourished and well developed.  General: Alert and oriented x3, cooperative and pleasant, no acute distress.  Head: normocephalic, atraumatic, neck supple.  Eyes: EOMI.  Respiratory: breath sounds clear in all fields, no wheezing, rales, or rhonchi. Cardiovascular: Regular rate and rhythm, no murmurs, gallops or rubs.  Abdomen: non-tender to palpation and soft, normoactive bowel sounds. Musculoskeletal:  Right Knee Exam:   No effusion present. No swelling present.   Slight valgus deformity.   The range of motion is: 0 to 125 degrees.   Significant crepitus on range of motion of the knee.   Positive lateral greater than medial joint line tenderness.   The knee is stable.   She has a shifting of the knee when she goes from an extended to a flexed position.  Calves soft and nontender. Motor function intact in LE. Strength 5/5 LE bilaterally. Neuro: Distal pulses 2+. Sensation to light touch intact in LE.  Imaging Review Plain radiographs demonstrate severe degenerative joint disease of the right knee. The overall alignment is mild valgus. The bone quality appears to be adequate for age and reported activity level.  Assessment/Plan:  End stage arthritis, right knee   The patient history, physical examination, clinical judgment of the provider and  imaging studies are consistent with end stage degenerative joint disease of the right knee and total knee arthroplasty is deemed medically necessary. The treatment options including medical management, injection therapy arthroscopy and arthroplasty were discussed at length. The risks and benefits of total knee arthroplasty were presented and reviewed. The risks due to aseptic loosening, infection, stiffness, patella tracking problems, thromboembolic complications and other imponderables were discussed. The patient acknowledged the explanation, agreed to proceed with the plan and consent was signed. Patient is being admitted for inpatient treatment for surgery, pain control, PT, OT, prophylactic antibiotics, VTE prophylaxis, progressive ambulation and ADLs and discharge planning. The patient is planning to be discharged  home .   Patient's anticipated LOS is less than 2 midnights, meeting these requirements: - Younger than 33 - Lives within 1 hour of care - Has a competent adult at home to recover with post-op recover - NO history of  - Chronic pain requiring opiods  - Diabetes  - Coronary Artery Disease  - Heart failure  - Heart attack  - Stroke  - DVT/VTE  - Cardiac arrhythmia  - Respiratory Failure/COPD  - Renal failure  - Anemia  - Advanced Liver disease  Therapy Plans: Outpatient therapy at [facility] Disposition: Home with daughter Planned DVT Prophylaxis: Aspirin 325 mg BID DME Needed: Gilford Rile, 3-in-1 PCP: Pricilla Holm, MD TXA: IV Allergies: NKDA Anesthesia Concerns: None BMI: 26 Last HgbA1c: Not diabetic Pharmacy: [facility]  Other:  - Bundle patient - Discussed tramadol, hydrocodone initially for pain management. May bump up to oxycodone if needed.  - Daughter calling Dr. Nathanial Millman office today regarding clearance  - Patient was instructed on what medications to stop prior to surgery. - Follow-up visit in 2 weeks with Dr. Wynelle Link - Begin physical therapy  following surgery - Pre-operative lab work as pre-surgical testing - Prescriptions will be provided in hospital at time of discharge  Theresa Duty, PA-C Orthopedic Surgery [facility] [place]

## 2021-04-25 DIAGNOSIS — H2512 Age-related nuclear cataract, left eye: Secondary | ICD-10-CM | POA: Diagnosis not present

## 2021-05-02 NOTE — Progress Notes (Signed)
DUE TO COVID-19 ONLY ONE VISITOR IS ALLOWED TO COME WITH YOU AND STAY IN THE WAITING ROOM ONLY DURING PRE OP AND PROCEDURE DAY OF SURGERY.  2 VISITOR  MAY VISIT WITH YOU AFTER SURGERY IN YOUR PRIVATE ROOM DURING VISITING HOURS ONLY!  YOU NEED TO HAVE A COVID 19 TEST ON__8/25/2022 ____'@_'$  '@_from'$  8am-3pm _____, THIS TEST MUST BE DONE BEFORE SURGERY,  Covid test is done at [place], [place] [address].  This is a drive thru.  No appt required. Please see map.                 Your procedure is scheduled on:  05/20/2021   Report to [facility] Main  Entrance   Report to admitting at   Isabela AM     Call this number if you have problems the morning of surgery [phone]    REMEMBER: NO  SOLID FOOD CANDY OR GUM AFTER MIDNIGHT. CLEAR LIQUIDS UNTIL  0415am        . NOTHING BY MOUTH EXCEPT CLEAR LIQUIDS UNTIL  0415am   . PLEASE FINISH ENSURE DRINK PER SURGEON ORDER  WHICH NEEDS TO BE COMPLETED AT    0415am   .      CLEAR LIQUID DIET   Foods Allowed                                                                    Coffee and tea, regular and decaf                            Fruit ices (not with fruit pulp)                                      Iced Popsicles                                    Carbonated beverages, regular and diet                                    Cranberry, grape and apple juices Sports drinks like Gatorade Lightly seasoned clear broth or consume(fat free) Sugar, honey syrup ___________________________________________________________________      BRUSH YOUR TEETH MORNING OF SURGERY AND RINSE YOUR MOUTH OUT, NO CHEWING GUM CANDY OR MINTS.     Take these medicines the morning of surgery with A SIP OF WATER:  none   DO NOT TAKE ANY DIABETIC MEDICATIONS DAY OF YOUR SURGERY                               You may not have any metal on your body including hair pins and              piercings  Do not wear jewelry, make-up, lotions, powders or  perfumes, deodorant             Do not wear nail  polish on your fingernails.  Do not shave  48 hours prior to surgery.              Men may shave face and neck.   Do not bring valuables to the hospital. [place].  Contacts, dentures or bridgework may not be worn into surgery.  Leave suitcase in the car. After surgery it may be brought to your room.     Patients discharged the day of surgery will not be allowed to drive home. IF YOU ARE HAVING SURGERY AND GOING HOME THE SAME DAY, YOU MUST HAVE AN ADULT TO DRIVE YOU HOME AND BE WITH YOU FOR 24 HOURS. YOU MAY GO HOME BY TAXI OR UBER OR ORTHERWISE, BUT AN ADULT MUST ACCOMPANY YOU HOME AND STAY WITH YOU FOR 24 HOURS.  Name and phone number of your driver:  Special Instructions: N/A              Please read over the following fact sheets you were given: _____________________________________________________________________  [facility] - Preparing for Surgery Before surgery, you can play an important role.  Because skin is not sterile, your skin needs to be as free of germs as possible.  You can reduce the number of germs on your skin by washing with CHG (chlorahexidine gluconate) soap before surgery.  CHG is an antiseptic cleaner which kills germs and bonds with the skin to continue killing germs even after washing. Please DO NOT use if you have an allergy to CHG or antibacterial soaps.  If your skin becomes reddened/irritated stop using the CHG and inform your nurse when you arrive at Short Stay. Do not shave (including legs and underarms) for at least 48 hours prior to the first CHG shower.  You may shave your face/neck. Please follow these instructions carefully:  1.  Shower with CHG Soap the night before surgery and the  morning of Surgery.  2.  If you choose to wash your hair, wash your hair first as usual with your  normal  shampoo.  3.  After you shampoo, rinse your hair and body thoroughly  to remove the  shampoo.                           4.  Use CHG as you would any other liquid soap.  You can apply chg directly  to the skin and wash                       Gently with a scrungie or clean washcloth.  5.  Apply the CHG Soap to your body ONLY FROM THE NECK DOWN.   Do not use on face/ open                           Wound or open sores. Avoid contact with eyes, ears mouth and genitals (private parts).                       Wash face,  Genitals (private parts) with your normal soap.             6.  Wash thoroughly, paying special attention to the area where your surgery  will be performed.  7.  Thoroughly rinse your body with warm water from the  neck down.  8.  DO NOT shower/wash with your normal soap after using and rinsing off  the CHG Soap.                9.  Pat yourself dry with a clean towel.            10.  Wear clean pajamas.            11.  Place clean sheets on your bed the night of your first shower and do not  sleep with pets. Day of Surgery : Do not apply any lotions/deodorants the morning of surgery.  Please wear clean clothes to the hospital/surgery center.  FAILURE TO FOLLOW THESE INSTRUCTIONS MAY RESULT IN THE CANCELLATION OF YOUR SURGERY PATIENT SIGNATURE_________________________________  NURSE SIGNATURE__________________________________  ________________________________________________________________________

## 2021-05-07 ENCOUNTER — Encounter (HOSPITAL_COMMUNITY)
Admission: RE | Admit: 2021-05-07 | Discharge: 2021-05-07 | Disposition: A | Discharge status: Home / Self Care | Payer: Medicare PPO | Source: Ambulatory Visit | Attending: Orthopedic Surgery | Admitting: Orthopedic Surgery

## 2021-05-07 ENCOUNTER — Encounter (HOSPITAL_COMMUNITY): Payer: Self-pay

## 2021-05-07 ENCOUNTER — Telehealth: Payer: Self-pay

## 2021-05-07 ENCOUNTER — Other Ambulatory Visit: Payer: Self-pay

## 2021-05-07 DIAGNOSIS — Z01812 Encounter for preprocedural laboratory examination: Secondary | ICD-10-CM | POA: Diagnosis not present

## 2021-05-07 HISTORY — DX: Gastro-esophageal reflux disease without esophagitis: K21.9

## 2021-05-07 LAB — COMPREHENSIVE METABOLIC PANEL
ALT: 17 U/L (ref 0–44)
AST: 19 U/L (ref 15–41)
Albumin: 4.2 g/dL (ref 3.5–5.0)
Alkaline Phosphatase: 52 U/L (ref 38–126)
Anion gap: 9 (ref 5–15)
BUN: 20 mg/dL (ref 8–23)
CO2: 28 mmol/L (ref 22–32)
Calcium: 9.4 mg/dL (ref 8.9–10.3)
Chloride: 102 mmol/L (ref 98–111)
Creatinine, Ser: 0.76 mg/dL (ref 0.44–1.00)
GFR, Estimated: >60 mL/min (ref >60)
Glucose, Bld: 94 mg/dL (ref 70–99)
Potassium: 4.4 mmol/L (ref 3.5–5.1)
Sodium: 139 mmol/L (ref 135–145)
Total Bilirubin: 1 mg/dL (ref 0.3–1.2)
Total Protein: 7 g/dL (ref 6.5–8.1)

## 2021-05-07 LAB — CBC
HCT: 45.9 % (ref 36.0–46.0)
Hemoglobin: 15.2 g/dL — ABNORMAL HIGH (ref 12.0–15.0)
MCH: 31.4 pg (ref 26.0–34.0)
MCHC: 33.1 g/dL (ref 30.0–36.0)
MCV: 94.8 fL (ref 80.0–100.0)
Platelets: 268 10*3/uL (ref 150–400)
RBC: 4.84 MIL/uL (ref 3.87–5.11)
RDW: 13 % (ref 11.5–15.5)
WBC: 8.5 10*3/uL (ref 4.0–10.5)
nRBC: 0 % (ref 0.0–0.2)

## 2021-05-07 LAB — SURGICAL PCR SCREEN
MRSA, PCR: NEGATIVE
Staphylococcus aureus: NEGATIVE

## 2021-05-07 NOTE — Progress Notes (Signed)
Anesthesia Review:  PCP: DR Pricilla Holm LOV 12/14/20  Requested clearance from ORtho office on 05/07/21.  Cardiologist : Chest x-ray : EKG : none  Echo : 2017  Stress test:2017  Cardiac Cath :  Activity level:  can do a flight of stair swithout difficulty  Sleep Study/ CPAP : none  Fasting Blood Sugar :      / Checks Blood Sugar -- times a day:   Blood Thinner/ Instructions /Last Dose: ASA / Instructions/ Last Dose :   Covid test- 05/16/21  VERY HOH- stand directly in front of pt and speak slowly and very loud - sometimes she hears and sometimes she does not  05/16/21- Dental Implant planned per pt  05/16/21- final cataract surgery followup per pt

## 2021-05-07 NOTE — Telephone Encounter (Signed)
Angela Barber with [facility] is calling to follow up on a faxed surgical clearance. Requesting that it be faxed as soon as possible.    Callback is [phone]

## 2021-05-10 ENCOUNTER — Other Ambulatory Visit: Payer: Self-pay | Admitting: Internal Medicine

## 2021-05-10 ENCOUNTER — Telehealth: Payer: Self-pay | Admitting: Internal Medicine

## 2021-05-10 NOTE — Telephone Encounter (Signed)
1.Medication Requested: cholestyramine (QUESTRAN) 4 g packet  2. Pharmacy (Name, [place], [facility]): [place], [place] [address]  Phone:  [phone] Fax:  [phone]   3. On Med List: yes  4. Last Visit with PCP: 03.25.22  5. Next visit date with PCP:  n/a   Agent: Please be advised that RX refills may take up to 3 business days. We ask that you follow-up with your pharmacy.

## 2021-05-13 NOTE — Telephone Encounter (Signed)
Medication has been sent to the patient's pharmacy.  

## 2021-05-15 NOTE — Telephone Encounter (Signed)
Called Angela Barber. LDVM letting her know the patient will be coming in Friday morning for her surgical clearance with EKG. Office number was provided in case she has additional questions or concerns.

## 2021-05-16 ENCOUNTER — Other Ambulatory Visit: Payer: Self-pay | Admitting: Orthopedic Surgery

## 2021-05-17 ENCOUNTER — Ambulatory Visit: Payer: Medicare PPO | Admitting: Internal Medicine

## 2021-05-17 ENCOUNTER — Encounter: Payer: Self-pay | Admitting: Internal Medicine

## 2021-05-17 ENCOUNTER — Other Ambulatory Visit: Payer: Self-pay

## 2021-05-17 DIAGNOSIS — Z01810 Encounter for preprocedural cardiovascular examination: Secondary | ICD-10-CM | POA: Diagnosis not present

## 2021-05-17 LAB — SARS CORONAVIRUS 2 (TAT 6-24 HRS): SARS Coronavirus 2: NEGATIVE

## 2021-05-17 MED ORDER — CHOLESTYRAMINE 4 G PO PACK: PACK | ORAL | 3 refills | Status: DC

## 2021-05-17 NOTE — Progress Notes (Signed)
   Subjective:   Patient ID: Angela Barber, female    DOB: 1936/05/26, 85 y.o.   MRN: [id number]  HPI The patient is an 85 YO female coming in for pre-op clearance. Having knee replacement surgery next week. Denies chest pains or SOB. Denies change in health since last visit. Activity limited by knee pain.   Review of Systems  Constitutional: Negative.   HENT: Negative.    Eyes: Negative.   Respiratory:  Negative for cough, chest tightness and shortness of breath.   Cardiovascular:  Negative for chest pain, palpitations and leg swelling.  Gastrointestinal:  Negative for abdominal distention, abdominal pain, constipation, diarrhea, nausea and vomiting.  Musculoskeletal:  Positive for arthralgias.  Skin: Negative.   Neurological: Negative.   Psychiatric/Behavioral: Negative.     Objective:  Physical Exam Constitutional:      Appearance: She is well-developed.  HENT:     Head: Normocephalic and atraumatic.     Comments: Hard of hearing Cardiovascular:     Rate and Rhythm: Normal rate and regular rhythm.  Pulmonary:     Effort: Pulmonary effort is normal. No respiratory distress.     Breath sounds: Normal breath sounds. No wheezing or rales.  Abdominal:     General: Bowel sounds are normal. There is no distension.     Palpations: Abdomen is soft.     Tenderness: There is no abdominal tenderness. There is no rebound.  Musculoskeletal:        General: Tenderness present.     Cervical back: Normal range of motion.  Skin:    General: Skin is warm and dry.  Neurological:     Mental Status: She is alert and oriented to person, place, and time.     Coordination: Coordination normal.    Vitals:   05/17/21 0949  BP: 122/88  Pulse: 89  Resp: 18  SpO2: 98%  Weight: 148 lb 9.6 oz (67.4 kg)  Height: '5\' 3"'$  (1.6 m)    EKG: Rate 88, axis normal, interval PR borderline long at 206, sinus, no st or t wave changes, no significant change compared to prior2017   This visit occurred  during the SARS-CoV-2 public health emergency.  Safety protocols were in place, including screening questions prior to the visit, additional usage of staff PPE, and extensive cleaning of exam room while observing appropriate contact time as indicated for disinfecting solutions.   Assessment & Plan:

## 2021-05-17 NOTE — Assessment & Plan Note (Signed)
EKG done for risk assessment which is unchanged from prior. No chest pains and BP at goal. Last labs without risk factors. Counseled about potential for constipation with opioids. Overall low risk and should proceed without further testing.

## 2021-05-19 ENCOUNTER — Encounter (HOSPITAL_COMMUNITY): Payer: Self-pay | Admitting: Orthopedic Surgery

## 2021-05-19 MED ORDER — BUPIVACAINE LIPOSOME 1.3 % IJ SUSP
20.0000 mL | Freq: Once | INTRAMUSCULAR | Status: DC
  Filled 2021-05-19: qty 20

## 2021-05-19 NOTE — Anesthesia Preprocedure Evaluation (Addendum)
Anesthesia Evaluation  Patient identified by MRN, date of birth, ID band Patient awake    Reviewed: Allergy & Precautions, NPO status , Patient's Chart, lab work & pertinent test results, reviewed documented beta blocker date and time   Airway Mallampati: II  TM Distance: >3 FB Neck ROM: Full    Dental   Pulmonary neg pulmonary ROS, former smoker,    Pulmonary exam normal breath sounds clear to auscultation       Cardiovascular negative cardio ROS Normal cardiovascular exam Rhythm:Regular Rate:Normal     Neuro/Psych PSYCHIATRIC DISORDERS Anxiety Depression Hx/o hearing loss S/P Cochlear implant    GI/Hepatic Neg liver ROS, GERD  ,  Endo/Other  Hyperlipidemia  Renal/GU negative Renal ROS   Urinary incontinence    Musculoskeletal  (+) Arthritis , Osteoarthritis,  OA right knee Osteoporosis   Abdominal   Peds  Hematology negative hematology ROS (+)   Anesthesia Other Findings   Reproductive/Obstetrics                            Anesthesia Physical Anesthesia Plan  ASA: 2  Anesthesia Plan: Spinal   Post-op Pain Management:  Regional for Post-op pain   Induction: Intravenous  PONV Risk Score and Plan: 3 and Treatment may vary due to age or medical condition, Propofol infusion and Ondansetron  Airway Management Planned: Natural Airway and Simple Face Mask  Additional Equipment:   Intra-op Plan:   Post-operative Plan:   Informed Consent: I have reviewed the patients History and Physical, chart, labs and discussed the procedure including the risks, benefits and alternatives for the proposed anesthesia with the patient or authorized representative who has indicated his/her understanding and acceptance.     Dental advisory given  Plan Discussed with: CRNA and Anesthesiologist  Anesthesia Plan Comments:        Anesthesia Quick Evaluation

## 2021-05-20 ENCOUNTER — Observation Stay (HOSPITAL_COMMUNITY)
Admission: RE | Admit: 2021-05-20 | Discharge: 2021-05-22 | Disposition: A | Discharge status: Home / Self Care | Payer: Medicare PPO | Attending: Orthopedic Surgery | Admitting: Orthopedic Surgery

## 2021-05-20 ENCOUNTER — Other Ambulatory Visit: Payer: Self-pay

## 2021-05-20 ENCOUNTER — Ambulatory Visit (HOSPITAL_COMMUNITY): Payer: Medicare PPO | Admitting: Emergency Medicine

## 2021-05-20 ENCOUNTER — Encounter (HOSPITAL_COMMUNITY): Payer: Self-pay | Admitting: Orthopedic Surgery

## 2021-05-20 ENCOUNTER — Encounter (HOSPITAL_COMMUNITY)
Admission: RE | Disposition: A | Discharge status: Home / Self Care | Payer: Self-pay | Source: Home / Self Care | Attending: Orthopedic Surgery

## 2021-05-20 ENCOUNTER — Ambulatory Visit (HOSPITAL_COMMUNITY): Payer: Medicare PPO | Admitting: Anesthesiology

## 2021-05-20 DIAGNOSIS — M170 Bilateral primary osteoarthritis of knee: Secondary | ICD-10-CM | POA: Diagnosis present

## 2021-05-20 DIAGNOSIS — M1711 Unilateral primary osteoarthritis, right knee: Principal | ICD-10-CM | POA: Insufficient documentation

## 2021-05-20 DIAGNOSIS — M179 Osteoarthritis of knee, unspecified: Secondary | ICD-10-CM | POA: Diagnosis present

## 2021-05-20 DIAGNOSIS — M1710 Unilateral primary osteoarthritis, unspecified knee: Secondary | ICD-10-CM

## 2021-05-20 DIAGNOSIS — G8918 Other acute postprocedural pain: Secondary | ICD-10-CM | POA: Diagnosis not present

## 2021-05-20 DIAGNOSIS — K219 Gastro-esophageal reflux disease without esophagitis: Secondary | ICD-10-CM | POA: Diagnosis not present

## 2021-05-20 DIAGNOSIS — Z87891 Personal history of nicotine dependence: Secondary | ICD-10-CM | POA: Diagnosis not present

## 2021-05-20 DIAGNOSIS — E785 Hyperlipidemia, unspecified: Secondary | ICD-10-CM | POA: Diagnosis not present

## 2021-05-20 HISTORY — PX: TOTAL KNEE ARTHROPLASTY: SHX125

## 2021-05-20 SURGERY — ARTHROPLASTY, KNEE, TOTAL
Anesthesia: Spinal | Site: Knee | Laterality: Right

## 2021-05-20 MED ORDER — VIBEGRON 75 MG PO TABS
75.0000 mg | ORAL_TABLET | Freq: Every day | ORAL | Status: DC
  Administered 2021-05-21 – 2021-05-22 (×2): 75 mg via ORAL

## 2021-05-20 MED ORDER — PROPOFOL 500 MG/50ML IV EMUL
INTRAVENOUS | Status: DC | PRN
  Administered 2021-05-20: 75 ug/kg/min via INTRAVENOUS

## 2021-05-20 MED ORDER — POLYVINYL ALCOHOL 1.4 % OP SOLN: Freq: Two times a day (BID) | OPHTHALMIC | Status: DC | PRN

## 2021-05-20 MED ORDER — METHOCARBAMOL 500 MG PO TABS
500.0000 mg | ORAL_TABLET | Freq: Four times a day (QID) | ORAL | Status: DC | PRN
  Administered 2021-05-20: 500 mg via ORAL
  Filled 2021-05-20: qty 1

## 2021-05-20 MED ORDER — MORPHINE SULFATE (PF) 2 MG/ML IV SOLN
1.0000 mg | INTRAVENOUS | Status: DC | PRN
  Administered 2021-05-20: 1 mg via INTRAVENOUS
  Filled 2021-05-20: qty 1

## 2021-05-20 MED ORDER — PHENOL 1.4 % MT LIQD: 1.0000 | OROMUCOSAL | Status: DC | PRN

## 2021-05-20 MED ORDER — ACETAMINOPHEN 500 MG PO TABS
500.0000 mg | ORAL_TABLET | Freq: Four times a day (QID) | ORAL | Status: AC
  Administered 2021-05-20 (×2): 500 mg via ORAL
  Filled 2021-05-20 (×2): qty 1

## 2021-05-20 MED ORDER — EPHEDRINE 5 MG/ML INJ
INTRAVENOUS | Status: AC
  Filled 2021-05-20: qty 5

## 2021-05-20 MED ORDER — SODIUM CHLORIDE 0.9 % IR SOLN
Status: DC | PRN
  Administered 2021-05-20: 1000 mL

## 2021-05-20 MED ORDER — OXYCODONE HCL 5 MG/5ML PO SOLN
5.0000 mg | Freq: Once | ORAL | Status: DC | PRN
Start: 2021-05-20 — End: 2021-05-20

## 2021-05-20 MED ORDER — METOCLOPRAMIDE HCL 5 MG PO TABS: 5.0000 mg | ORAL_TABLET | Freq: Three times a day (TID) | ORAL | Status: DC | PRN

## 2021-05-20 MED ORDER — TRANEXAMIC ACID-NACL 1000-0.7 MG/100ML-% IV SOLN
1000.0000 mg | INTRAVENOUS | Status: AC
  Administered 2021-05-20: 1000 mg via INTRAVENOUS
  Filled 2021-05-20: qty 100

## 2021-05-20 MED ORDER — FLEET ENEMA 7-19 GM/118ML RE ENEM: 1.0000 | ENEMA | Freq: Once | RECTAL | Status: DC | PRN

## 2021-05-20 MED ORDER — ONDANSETRON HCL 4 MG/2ML IJ SOLN: 4.0000 mg | Freq: Four times a day (QID) | INTRAMUSCULAR | Status: DC | PRN

## 2021-05-20 MED ORDER — PROPOFOL 500 MG/50ML IV EMUL
INTRAVENOUS | Status: AC
  Filled 2021-05-20: qty 50

## 2021-05-20 MED ORDER — PHENYLEPHRINE 40 MCG/ML (10ML) SYRINGE FOR IV PUSH (FOR BLOOD PRESSURE SUPPORT)
PREFILLED_SYRINGE | INTRAVENOUS | Status: AC
  Filled 2021-05-20: qty 10

## 2021-05-20 MED ORDER — ONDANSETRON HCL 4 MG/2ML IJ SOLN: 4.0000 mg | Freq: Once | INTRAMUSCULAR | Status: DC | PRN

## 2021-05-20 MED ORDER — OXYCODONE HCL 5 MG PO TABS: 5.0000 mg | ORAL_TABLET | Freq: Once | ORAL | Status: DC | PRN

## 2021-05-20 MED ORDER — STERILE WATER FOR IRRIGATION IR SOLN
Status: DC | PRN
  Administered 2021-05-20: 2000 mL

## 2021-05-20 MED ORDER — DIPHENHYDRAMINE HCL 12.5 MG/5ML PO ELIX: 12.5000 mg | ORAL_SOLUTION | ORAL | Status: DC | PRN

## 2021-05-20 MED ORDER — ONDANSETRON HCL 4 MG/2ML IJ SOLN
INTRAMUSCULAR | Status: DC | PRN
Start: 2021-05-20 — End: 2021-05-20
  Administered 2021-05-20: 4 mg via INTRAVENOUS

## 2021-05-20 MED ORDER — MIDAZOLAM HCL 2 MG/2ML IJ SOLN
INTRAMUSCULAR | Status: AC
  Filled 2021-05-20: qty 2

## 2021-05-20 MED ORDER — TRIAMCINOLONE ACETONIDE 55 MCG/ACT NA AERO
2.0000 | INHALATION_SPRAY | Freq: Every day | NASAL | Status: DC
  Administered 2021-05-21 – 2021-05-22 (×2): 2 via NASAL
  Filled 2021-05-20: qty 21.6

## 2021-05-20 MED ORDER — ASPIRIN EC 325 MG PO TBEC
325.0000 mg | DELAYED_RELEASE_TABLET | Freq: Two times a day (BID) | ORAL | Status: DC
  Administered 2021-05-21 – 2021-05-22 (×3): 325 mg via ORAL
  Filled 2021-05-20 (×3): qty 1

## 2021-05-20 MED ORDER — BUPIVACAINE LIPOSOME 1.3 % IJ SUSP
INTRAMUSCULAR | Status: DC | PRN
  Administered 2021-05-20: 20 mL

## 2021-05-20 MED ORDER — LACTATED RINGERS IV SOLN: INTRAVENOUS | Status: DC

## 2021-05-20 MED ORDER — TRAMADOL HCL 50 MG PO TABS
50.0000 mg | ORAL_TABLET | Freq: Four times a day (QID) | ORAL | Status: DC | PRN
  Administered 2021-05-20 – 2021-05-21 (×2): 100 mg via ORAL
  Filled 2021-05-20 (×2): qty 2

## 2021-05-20 MED ORDER — SODIUM CHLORIDE 0.9 % IV SOLN: INTRAVENOUS | Status: DC

## 2021-05-20 MED ORDER — FENTANYL CITRATE (PF) 100 MCG/2ML IJ SOLN
INTRAMUSCULAR | Status: DC | PRN
  Administered 2021-05-20: 50 ug via INTRAVENOUS

## 2021-05-20 MED ORDER — HYDROCODONE-ACETAMINOPHEN 5-325 MG PO TABS
1.0000 | ORAL_TABLET | ORAL | Status: DC | PRN
  Administered 2021-05-20 (×2): 2 via ORAL
  Administered 2021-05-20: 1 via ORAL
  Administered 2021-05-21 – 2021-05-22 (×6): 2 via ORAL
  Filled 2021-05-20 (×8): qty 2
  Filled 2021-05-20 (×2): qty 1

## 2021-05-20 MED ORDER — SODIUM CHLORIDE (PF) 0.9 % IJ SOLN
INTRAMUSCULAR | Status: AC
  Filled 2021-05-20: qty 10

## 2021-05-20 MED ORDER — DEXAMETHASONE SODIUM PHOSPHATE 10 MG/ML IJ SOLN
10.0000 mg | Freq: Once | INTRAMUSCULAR | Status: AC
  Administered 2021-05-21: 10 mg via INTRAVENOUS
  Filled 2021-05-20: qty 1

## 2021-05-20 MED ORDER — METHOCARBAMOL 500 MG IVPB - SIMPLE MED
500.0000 mg | Freq: Four times a day (QID) | INTRAVENOUS | Status: DC | PRN
  Filled 2021-05-20: qty 50

## 2021-05-20 MED ORDER — PHENYLEPHRINE 40 MCG/ML (10ML) SYRINGE FOR IV PUSH (FOR BLOOD PRESSURE SUPPORT)
PREFILLED_SYRINGE | INTRAVENOUS | Status: DC | PRN
  Administered 2021-05-20: 120 ug via INTRAVENOUS
  Administered 2021-05-20 (×2): 80 ug via INTRAVENOUS
  Administered 2021-05-20 (×2): 120 ug via INTRAVENOUS
  Administered 2021-05-20 (×2): 80 ug via INTRAVENOUS

## 2021-05-20 MED ORDER — METOCLOPRAMIDE HCL 5 MG/ML IJ SOLN: 5.0000 mg | Freq: Three times a day (TID) | INTRAMUSCULAR | Status: DC | PRN

## 2021-05-20 MED ORDER — EPHEDRINE SULFATE-NACL 50-0.9 MG/10ML-% IV SOSY
PREFILLED_SYRINGE | INTRAVENOUS | Status: DC | PRN
  Administered 2021-05-20: 5 mg via INTRAVENOUS

## 2021-05-20 MED ORDER — ACETAMINOPHEN 10 MG/ML IV SOLN
1000.0000 mg | Freq: Four times a day (QID) | INTRAVENOUS | Status: DC
  Administered 2021-05-20: 1000 mg via INTRAVENOUS
  Filled 2021-05-20: qty 100

## 2021-05-20 MED ORDER — BISACODYL 10 MG RE SUPP: 10.0000 mg | Freq: Every day | RECTAL | Status: DC | PRN

## 2021-05-20 MED ORDER — DEXAMETHASONE SODIUM PHOSPHATE 10 MG/ML IJ SOLN
8.0000 mg | Freq: Once | INTRAMUSCULAR | Status: AC
  Administered 2021-05-20: 8 mg via INTRAVENOUS

## 2021-05-20 MED ORDER — CEFAZOLIN SODIUM-DEXTROSE 2-4 GM/100ML-% IV SOLN
2.0000 g | INTRAVENOUS | Status: AC
  Administered 2021-05-20: 2 g via INTRAVENOUS
  Filled 2021-05-20: qty 100

## 2021-05-20 MED ORDER — CEFAZOLIN SODIUM-DEXTROSE 2-4 GM/100ML-% IV SOLN
2.0000 g | Freq: Four times a day (QID) | INTRAVENOUS | Status: AC
Start: 2021-05-20 — End: 2021-05-20
  Administered 2021-05-20 (×2): 2 g via INTRAVENOUS
  Filled 2021-05-20 (×2): qty 100

## 2021-05-20 MED ORDER — POVIDONE-IODINE 10 % EX SWAB
2.0000 "application " | Freq: Once | CUTANEOUS | Status: AC
  Administered 2021-05-20: 2 via TOPICAL

## 2021-05-20 MED ORDER — FENTANYL CITRATE PF 50 MCG/ML IJ SOSY: 25.0000 ug | PREFILLED_SYRINGE | INTRAMUSCULAR | Status: DC | PRN

## 2021-05-20 MED ORDER — ONDANSETRON HCL 4 MG PO TABS: 4.0000 mg | ORAL_TABLET | Freq: Four times a day (QID) | ORAL | Status: DC | PRN

## 2021-05-20 MED ORDER — POLYETHYLENE GLYCOL 3350 17 G PO PACK: 17.0000 g | PACK | Freq: Every day | ORAL | Status: DC | PRN

## 2021-05-20 MED ORDER — ROPIVACAINE HCL 5 MG/ML IJ SOLN
INTRAMUSCULAR | Status: DC | PRN
  Administered 2021-05-20: 30 mL via PERINEURAL

## 2021-05-20 MED ORDER — 0.9 % SODIUM CHLORIDE (POUR BTL) OPTIME
TOPICAL | Status: DC | PRN
  Administered 2021-05-20: 1000 mL

## 2021-05-20 MED ORDER — BUPIVACAINE IN DEXTROSE 0.75-8.25 % IT SOLN
INTRATHECAL | Status: DC | PRN
  Administered 2021-05-20: 1.6 mL via INTRATHECAL

## 2021-05-20 MED ORDER — MENTHOL 3 MG MT LOZG: 1.0000 | LOZENGE | OROMUCOSAL | Status: DC | PRN

## 2021-05-20 MED ORDER — PROPOFOL 10 MG/ML IV BOLUS
INTRAVENOUS | Status: DC | PRN
  Administered 2021-05-20: 10 mg via INTRAVENOUS
  Administered 2021-05-20: 20 mg via INTRAVENOUS

## 2021-05-20 MED ORDER — DOCUSATE SODIUM 100 MG PO CAPS
100.0000 mg | ORAL_CAPSULE | Freq: Two times a day (BID) | ORAL | Status: DC
  Administered 2021-05-20 – 2021-05-22 (×4): 100 mg via ORAL
  Filled 2021-05-20 (×4): qty 1

## 2021-05-20 MED ORDER — DEXAMETHASONE SODIUM PHOSPHATE 10 MG/ML IJ SOLN
INTRAMUSCULAR | Status: AC
  Filled 2021-05-20: qty 1

## 2021-05-20 MED ORDER — CHLORHEXIDINE GLUCONATE CLOTH 2 % EX PADS
6.0000 | MEDICATED_PAD | Freq: Every day | CUTANEOUS | Status: DC
  Administered 2021-05-21: 6 via TOPICAL

## 2021-05-20 MED ORDER — FENTANYL CITRATE (PF) 100 MCG/2ML IJ SOLN
INTRAMUSCULAR | Status: AC
  Filled 2021-05-20: qty 2

## 2021-05-20 MED ORDER — SODIUM CHLORIDE (PF) 0.9 % IJ SOLN
INTRAMUSCULAR | Status: DC | PRN
  Administered 2021-05-20: 60 mL

## 2021-05-20 MED ORDER — PREDNISOL ACE-MOXIFLOX-BROMFEN 1-0.5-0.075 % OP SUSP
1.0000 [drp] | Freq: Four times a day (QID) | OPHTHALMIC | Status: DC
  Administered 2021-05-20: 1 [drp] via OPHTHALMIC

## 2021-05-20 SURGICAL SUPPLY — 53 items
BAG COUNTER SPONGE SURGICOUNT (BAG) IMPLANT
BAG SPEC THK2 15X12 ZIP CLS (MISCELLANEOUS) ×1
BAG SPNG CNTER NS LX DISP (BAG)
BAG ZIPLOCK 12X15 (MISCELLANEOUS) ×2 IMPLANT
BLADE SAG 18X100X1.27 (BLADE) ×2 IMPLANT
BLADE SAW SGTL 11.0X1.19X90.0M (BLADE) ×2 IMPLANT
BNDG ELASTIC 6X5.8 VLCR STR LF (GAUZE/BANDAGES/DRESSINGS) ×2 IMPLANT
BOWL SMART MIX CTS (DISPOSABLE) ×2 IMPLANT
CEMENT HV SMART SET (Cement) ×4 IMPLANT
CEMENT TIBIA MBT (Knees) IMPLANT
COVER SURGICAL LIGHT HANDLE (MISCELLANEOUS) ×2 IMPLANT
CUFF TOURN SGL QUICK 34 (TOURNIQUET CUFF) ×2
CUFF TRNQT CYL 34X4.125X (TOURNIQUET CUFF) ×1 IMPLANT
DECANTER SPIKE VIAL GLASS SM (MISCELLANEOUS) ×2 IMPLANT
DRAPE U-SHAPE 47X51 STRL (DRAPES) ×2 IMPLANT
DRSG AQUACEL AG ADV 3.5X10 (GAUZE/BANDAGES/DRESSINGS) ×2 IMPLANT
DURAPREP 26ML APPLICATOR (WOUND CARE) ×2 IMPLANT
ELECT REM PT RETURN 15FT ADLT (MISCELLANEOUS) ×2 IMPLANT
FEMUR SIGMA PS SZ 4.0N R (Femur) ×1 IMPLANT
GLOVE SRG 8 PF TXTR STRL LF DI (GLOVE) ×1 IMPLANT
GLOVE SURG ENC MOIS LTX SZ6.5 (GLOVE) ×2 IMPLANT
GLOVE SURG ENC MOIS LTX SZ8 (GLOVE) ×4 IMPLANT
GLOVE SURG UNDER POLY LF SZ7 (GLOVE) ×2 IMPLANT
GLOVE SURG UNDER POLY LF SZ8 (GLOVE) ×2
GLOVE SURG UNDER POLY LF SZ8.5 (GLOVE) ×2 IMPLANT
GOWN STRL REUS W/TWL LRG LVL3 (GOWN DISPOSABLE) ×4 IMPLANT
GOWN STRL REUS W/TWL XL LVL3 (GOWN DISPOSABLE) ×2 IMPLANT
HANDPIECE INTERPULSE COAX TIP (DISPOSABLE) ×2
HOLDER FOLEY CATH W/STRAP (MISCELLANEOUS) IMPLANT
IMMOBILIZER KNEE 20 (SOFTGOODS) ×2
IMMOBILIZER KNEE 20 THIGH 36 (SOFTGOODS) ×1 IMPLANT
KIT TURNOVER KIT A (KITS) ×2 IMPLANT
MANIFOLD NEPTUNE II (INSTRUMENTS) ×2 IMPLANT
NS IRRIG 1000ML POUR BTL (IV SOLUTION) ×2 IMPLANT
PACK TOTAL KNEE CUSTOM (KITS) ×2 IMPLANT
PADDING CAST COTTON 6X4 STRL (CAST SUPPLIES) ×3 IMPLANT
PATELLA DOME PFC 35MM (Knees) ×1 IMPLANT
PENCIL SMOKE EVACUATOR (MISCELLANEOUS) ×2 IMPLANT
PIN STEINMAN FIXATION KNEE (PIN) ×1 IMPLANT
PLATE ROT INSERT 12.5MM SIZE 4 (Plate) ×1 IMPLANT
PROTECTOR NERVE ULNAR (MISCELLANEOUS) ×2 IMPLANT
SET HNDPC FAN SPRY TIP SCT (DISPOSABLE) ×1 IMPLANT
STRIP CLOSURE SKIN 1/2X4 (GAUZE/BANDAGES/DRESSINGS) ×4 IMPLANT
SUT MNCRL AB 4-0 PS2 18 (SUTURE) ×2 IMPLANT
SUT STRATAFIX 0 PDS 27 VIOLET (SUTURE) ×2
SUT VIC AB 2-0 CT1 27 (SUTURE) ×6
SUT VIC AB 2-0 CT1 TAPERPNT 27 (SUTURE) ×3 IMPLANT
SUTURE STRATFX 0 PDS 27 VIOLET (SUTURE) ×1 IMPLANT
TIBIA MBT CEMENT (Knees) ×2 IMPLANT
TRAY FOLEY MTR SLVR 16FR STAT (SET/KITS/TRAYS/PACK) ×2 IMPLANT
TUBE SUCTION HIGH CAP CLEAR NV (SUCTIONS) ×2 IMPLANT
WATER STERILE IRR 1000ML POUR (IV SOLUTION) ×4 IMPLANT
WRAP KNEE MAXI GEL POST OP (GAUZE/BANDAGES/DRESSINGS) ×2 IMPLANT

## 2021-05-20 NOTE — Anesthesia Procedure Notes (Addendum)
  Anesthesia Regional Block: Adductor canal block   Pre-Anesthetic Checklist: , timeout performed,  Correct Patient, Correct Site, Correct Laterality,  Correct Procedure, Correct Position, site marked,  Risks and benefits discussed,  Surgical consent,  Pre-op evaluation,  At surgeon's request and post-op pain management  Laterality: Right  Prep: chloraprep       Needles:  Injection technique: Single-shot  Needle Type: Echogenic Stimulator Needle     Needle Length: 10cm  Needle Gauge: 21   Needle insertion depth: 6 cm   Additional Needles:   Procedures:,,,, ultrasound used (permanent image in chart),,    Narrative:  Start time: 05/20/2021 7:02 AM End time: 05/20/2021 7:07 AM Injection made incrementally with aspirations every 5 mL.  Performed by: Personally  Anesthesiologist: Josephine Igo, MD  Additional Notes: Timeout performed. Patient sedated. Relevant anatomy ID'd using [place]. Incremental 2-66m injection of LA with frequent aspiration. Patient tolerated procedure well.

## 2021-05-20 NOTE — Progress Notes (Signed)
Orthopedic Tech Progress Note Patient Details:  Angela Barber 03-04-1936 [id number]  CPM Right Knee CPM Right Knee: On Right Knee Flexion (Degrees): 40 Right Knee Extension (Degrees): 10  Post Interventions Patient Tolerated: Well Instructions Provided: Care of device  [name] 05/20/2021, 9:26 AM

## 2021-05-20 NOTE — Interval H&P Note (Signed)
History and Physical Interval Note:  05/20/2021 6:21 AM  Angela Barber  has presented today for surgery, with the diagnosis of right knee osteoarthritis.  The various methods of treatment have been discussed with the patient and family. After consideration of risks, benefits and other options for treatment, the patient has consented to  Procedure(s): TOTAL KNEE ARTHROPLASTY (Right) as a surgical intervention.  The patient's history has been reviewed, patient examined, no change in status, stable for surgery.  I have reviewed the patient's chart and labs.  Questions were answered to the patient's satisfaction.     Pilar Plate Marylyn Appenzeller

## 2021-05-20 NOTE — Anesthesia Procedure Notes (Signed)
Spinal  Patient location during procedure: OR Start time: 05/20/2021 7:18 AM End time: 05/20/2021 7:22 AM Reason for block: surgical anesthesia Staffing Performed: resident/CRNA  Resident/CRNA: Lollie Sails, CRNA Preanesthetic Checklist Completed: patient identified, IV checked, site marked, risks and benefits discussed, surgical consent, monitors and equipment checked, pre-op evaluation and timeout performed Spinal Block Patient position: sitting Prep: DuraPrep and site prepped and draped Patient monitoring: heart rate, continuous pulse ox, blood pressure and cardiac monitor Approach: midline Location: L3-4 Injection technique: single-shot Needle Needle type: Sprotte  Needle gauge: 24 G Needle length: 10 cm Assessment Events: CSF return Additional Notes Expiration date on kit noted and within range.  Sterile prep and drape applied.   Skin localized with Lidocaine 1%.  Good CSF flow noted with no heme or c/o paresthesia.   Patient tolerated well.

## 2021-05-20 NOTE — Discharge Instructions (Addendum)
 Frank Aluisio, MD Total Joint Specialist [facility] [place] [address] [place], [place] [address] [phone]  TOTAL KNEE REPLACEMENT POSTOPERATIVE DIRECTIONS    Knee Rehabilitation, Guidelines Following Surgery  Results after knee surgery are often greatly improved when you follow the exercise, range of motion and muscle strengthening exercises prescribed by your doctor. Safety measures are also important to protect the knee from further injury. If any of these exercises cause you to have increased pain or swelling in your knee joint, decrease the amount until you are comfortable again and slowly increase them. If you have problems or questions, call your caregiver or physical therapist for advice.   BLOOD CLOT PREVENTION Take a 325 mg Aspirin two times a day for three weeks following surgery. Then take an 81 mg Aspirin once a day for three weeks. Then discontinue Aspirin. You may resume your vitamins/supplements upon discharge from the hospital. Do not take any NSAIDs (Advil, Aleve, Ibuprofen, Meloxicam, etc.) until you have discontinued the 325 mg Aspirin.  HOME CARE INSTRUCTIONS  Remove items at home which could result in a fall. This includes throw rugs or furniture in walking pathways.  ICE to the affected knee as much as tolerated. Icing helps control swelling. If the swelling is well controlled you will be more comfortable and rehab easier. Continue to use ice on the knee for pain and swelling from surgery. You may notice swelling that will progress down to the foot and ankle. This is normal after surgery. Elevate the leg when you are not up walking on it.    Continue to use the breathing machine which will help keep your temperature down. It is common for your temperature to cycle up and down following surgery, especially at night when you are not up moving around and exerting yourself. The breathing machine keeps your lungs expanded and your temperature  down. Do not place pillow under the operative knee, focus on keeping the knee straight while resting  DIET You may resume your previous home diet once you are discharged from the hospital.  DRESSING / WOUND CARE / SHOWERING Keep your bulky bandage on for 2 days. On the third post-operative day you may remove the Ace bandage and gauze. There is a waterproof adhesive bandage on your skin which will stay in place until your first follow-up appointment. Once you remove this you will not need to place another bandage You may begin showering 3 days following surgery, but do not submerge the incision under water.  ACTIVITY For the first 5 days, the key is rest and control of pain and swelling Do your home exercises twice a day starting on post-operative day 3. On the days you go to physical therapy, just do the home exercises once that day. You should rest, ice and elevate the leg for 50 minutes out of every hour. Get up and walk/stretch for 10 minutes per hour. After 5 days you can increase your activity slowly as tolerated. Walk with your walker as instructed. Use the walker until you are comfortable transitioning to a cane. Walk with the cane in the opposite hand of the operative leg. You may discontinue the cane once you are comfortable and walking steadily. Avoid periods of inactivity such as sitting longer than an hour when not asleep. This helps prevent blood clots.  You may discontinue the knee immobilizer once you are able to perform a straight leg raise while lying down. You may resume a sexual relationship in one month   or when given the OK by your doctor.  You may return to work once you are cleared by your doctor.  Do not drive a car for 6 weeks or until released by your surgeon.  Do not drive while taking narcotics.  TED HOSE STOCKINGS Wear the elastic stockings on both legs for three weeks following surgery during the day. You may remove them at night for sleeping.  WEIGHT  BEARING Weight bearing as tolerated with assist device (walker, cane, etc) as directed, use it as long as suggested by your surgeon or therapist, typically at least 4-6 weeks.  POSTOPERATIVE CONSTIPATION PROTOCOL Constipation - defined medically as fewer than three stools per week and severe constipation as less than one stool per week.  One of the most common issues patients have following surgery is constipation.  Even if you have a regular bowel pattern at home, your normal regimen is likely to be disrupted due to multiple reasons following surgery.  Combination of anesthesia, postoperative narcotics, change in appetite and fluid intake all can affect your bowels.  In order to avoid complications following surgery, here are some recommendations in order to help you during your recovery period.  Colace (docusate) - Pick up an over-the-counter form of Colace or another stool softener and take twice a day as long as you are requiring postoperative pain medications.  Take with a full glass of water daily.  If you experience loose stools or diarrhea, hold the colace until you stool forms back up. If your symptoms do not get better within 1 week or if they get worse, check with your doctor. Dulcolax (bisacodyl) - Pick up over-the-counter and take as directed by the product packaging as needed to assist with the movement of your bowels.  Take with a full glass of water.  Use this product as needed if not relieved by Colace only.  MiraLax (polyethylene glycol) - Pick up over-the-counter to have on hand. MiraLax is a solution that will increase the amount of water in your bowels to assist with bowel movements.  Take as directed and can mix with a glass of water, juice, soda, coffee, or tea. Take if you go more than two days without a movement. Do not use MiraLax more than once per day. Call your doctor if you are still constipated or irregular after using this medication for 7 days in a row.  If you continue  to have problems with postoperative constipation, please contact the office for further assistance and recommendations.  If you experience "the worst abdominal pain ever" or develop nausea or vomiting, please contact the office immediatly for further recommendations for treatment.  ITCHING If you experience itching with your medications, try taking only a single pain pill, or even half a pain pill at a time.  You can also use Benadryl over the counter for itching or also to help with sleep.   MEDICATIONS See your medication summary on the "After Visit Summary" that the nursing staff will review with you prior to discharge.  You may have some home medications which will be placed on hold until you complete the course of blood thinner medication.  It is important for you to complete the blood thinner medication as prescribed by your surgeon.  Continue your approved medications as instructed at time of discharge.  PRECAUTIONS If you experience chest pain or shortness of breath - call 911 immediately for transfer to the hospital emergency department.  If you develop a fever greater that 101 F,   purulent drainage from wound, increased redness or drainage from wound, foul odor from the wound/dressing, or calf pain - CONTACT YOUR SURGEON.                                                   FOLLOW-UP APPOINTMENTS Make sure you keep all of your appointments after your operation with your surgeon and caregivers. You should call the office at the above phone number and make an appointment for approximately two weeks after the date of your surgery or on the date instructed by your surgeon outlined in the "After Visit Summary".  RANGE OF MOTION AND STRENGTHENING EXERCISES  Rehabilitation of the knee is important following a knee injury or an operation. After just a few days of immobilization, the muscles of the thigh which control the knee become weakened and shrink (atrophy). Knee exercises are designed to build up  the tone and strength of the thigh muscles and to improve knee motion. Often times heat used for twenty to thirty minutes before working out will loosen up your tissues and help with improving the range of motion but do not use heat for the first two weeks following surgery. These exercises can be done on a training (exercise) mat, on the floor, on a table or on a bed. Use what ever works the best and is most comfortable for you Knee exercises include:  Leg Lifts - While your knee is still immobilized in a splint or cast, you can do straight leg raises. Lift the leg to 60 degrees, hold for 3 sec, and slowly lower the leg. Repeat 10-20 times 2-3 times daily. Perform this exercise against resistance later as your knee gets better.  Quad and Hamstring Sets - Tighten up the muscle on the front of the thigh (Quad) and hold for 5-10 sec. Repeat this 10-20 times hourly. Hamstring sets are done by pushing the foot backward against an object and holding for 5-10 sec. Repeat as with quad sets.  Leg Slides: Lying on your back, slowly slide your foot toward your buttocks, bending your knee up off the floor (only go as far as is comfortable). Then slowly slide your foot back down until your leg is flat on the floor again. Angel Wings: Lying on your back spread your legs to the side as far apart as you can without causing discomfort.  A rehabilitation program following serious knee injuries can speed recovery and prevent re-injury in the future due to weakened muscles. Contact your doctor or a physical therapist for more information on knee rehabilitation.   POST-OPERATIVE OPIOID TAPER INSTRUCTIONS: It is important to wean off of your opioid medication as soon as possible. If you do not need pain medication after your surgery it is ok to stop day one. Opioids include: Codeine, Hydrocodone(Norco, Vicodin), Oxycodone(Percocet, oxycontin) and hydromorphone amongst others.  Long term and even short term use of opiods can  cause: Increased pain response Dependence Constipation Depression Respiratory depression And more.  Withdrawal symptoms can include Flu like symptoms Nausea, vomiting And more Techniques to manage these symptoms Hydrate well Eat regular healthy meals Stay active Use relaxation techniques(deep breathing, meditating, yoga) Do Not substitute Alcohol to help with tapering If you have been on opioids for less than two weeks and do not have pain than it is ok to stop all together.  Plan   to wean off of opioids This plan should start within one week post op of your joint replacement. Maintain the same interval or time between taking each dose and first decrease the dose.  Cut the total daily intake of opioids by one tablet each day Next start to increase the time between doses. The last dose that should be eliminated is the evening dose.   IF YOU ARE TRANSFERRED TO A SKILLED REHAB FACILITY If the patient is transferred to a skilled rehab facility following release from the hospital, a list of the current medications will be sent to the facility for the patient to continue.  When discharged from the skilled rehab facility, please have the facility set up the patient's Home Health Physical Therapy prior to being released. Also, the skilled facility will be responsible for providing the patient with their medications at time of release from the facility to include their pain medication, the muscle relaxants, and their blood thinner medication. If the patient is still at the rehab facility at time of the two week follow up appointment, the skilled rehab facility will also need to assist the patient in arranging follow up appointment in our office and any transportation needs.  MAKE SURE YOU:  Understand these instructions.  Get help right away if you are not doing well or get worse.   DENTAL ANTIBIOTICS:  In most cases prophylactic antibiotics for Dental procdeures after total joint surgery are  not necessary.  Exceptions are as follows:  1. History of prior total joint infection  2. Severely immunocompromised (Organ Transplant, cancer chemotherapy, Rheumatoid biologic meds such as Humera)  3. Poorly controlled diabetes (A1C &gt; 8.0, blood glucose over 200)  If you have one of these conditions, contact your surgeon for an antibiotic prescription, prior to your dental procedure.    Pick up stool softner and laxative for home use following surgery while on pain medications. Do not submerge incision under water. Please use good hand washing techniques while changing dressing each day. May shower starting three days after surgery. Please use a clean towel to pat the incision dry following showers. Continue to use ice for pain and swelling after surgery. Do not use any lotions or creams on the incision until instructed by your surgeon.  

## 2021-05-20 NOTE — Evaluation (Signed)
Physical Therapy Evaluation Patient Details Name: Angela Barber MRN: [id number] DOB: Jun 01, 1936 Today's Date: 05/20/2021   History of Present Illness  s/p R TKA PMHx: cochlear implant, anxiety, bil RCR  Clinical Impression  Pt is s/p TKA resulting in the deficits listed below (see PT Problem List).  Pt amb ~ 5' with RW and min assist. Doing well but limited by fatigue.   Pt will benefit from skilled PT to increase their independence and safety with mobility to allow discharge to the venue listed below.      Follow Up Recommendations Follow surgeon's recommendation for DC plan and follow-up therapies    Equipment Recommendations  None recommended by PT    Recommendations for Other Services       Precautions / Restrictions Precautions Precautions: Knee Required Braces or Orthoses: Knee Immobilizer - Right Knee Immobilizer - Right: Discontinue once straight leg raise with < 10 degree lag Restrictions Weight Bearing Restrictions: No Other Position/Activity Restrictions: WBAT      Mobility  Bed Mobility Overal bed mobility: Needs Assistance Bed Mobility: Supine to Sit     Supine to sit: Min assist     General bed mobility comments: assist with RLE, multi-modal cues for technique    Transfers Overall transfer level: Needs assistance Equipment used: Rolling walker (2 wheeled) Transfers: Sit to/from Stand Sit to Stand: Min assist         General transfer comment: cues for hand placement and RLE position  Ambulation/Gait Ambulation/Gait assistance: Min Web designer (Feet): 6 Feet Assistive device: Rolling walker (2 wheeled) Gait Pattern/deviations: Step-to pattern     General Gait Details: cues for sequence and RW position  Stairs            Wheelchair Mobility    Modified Rankin (Stroke Patients Only)       Balance Overall balance assessment: Needs assistance   Sitting balance-Leahy Scale: Fair       Standing balance-Leahy Scale:  Poor Standing balance comment: reliant on UEs                             Pertinent Vitals/Pain Pain Assessment: 0-10 Pain Location: right knee Pain Descriptors / Indicators: Aching;Grimacing;Sore Pain Intervention(s): Limited activity within patient's tolerance;Monitored during session;Repositioned;Premedicated before session;Ice applied    Home Living Family/patient expects to be discharged to:: Private residence   Available Help at Discharge: Family Type of Home: House Home Access: Stairs to enter Entrance Stairs-Rails: None Technical brewer of Steps: 2 Home Layout: One level Home Equipment: None Additional Comments: dtr staying with pt for ~ 1 wk    Prior Function Level of Independence: Independent               Hand Dominance        Extremity/Trunk Assessment   Upper Extremity Assessment Upper Extremity Assessment: Defer to OT evaluation    Lower Extremity Assessment Lower Extremity Assessment: RLE deficits/detail RLE Deficits / Details: ankle WFL, knee extension and hip flexion 2+/5       Communication   Communication: HOH  Cognition Arousal/Alertness: Awake/alert Behavior During Therapy: WFL for tasks assessed/performed Overall Cognitive Status: Within Functional Limits for tasks assessed                                        General Comments      Exercises Total  Joint Exercises Ankle Circles/Pumps: AROM;Both;10 reps Quad Sets: AROM;5 reps;Right   Assessment/Plan    PT Assessment Patient needs continued PT services  PT Problem List Decreased strength;Decreased range of motion;Decreased activity tolerance;Decreased mobility;Pain;Decreased knowledge of use of DME;Decreased balance;Decreased knowledge of precautions       PT Treatment Interventions DME instruction;Gait training;Functional mobility training;Therapeutic activities;Therapeutic exercise;Patient/family education;Stair training    PT Goals  (Current goals can be found in the Care Plan section)  Acute Rehab PT Goals Patient Stated Goal: home soon PT Goal Formulation: With patient Time For Goal Achievement: 06/03/21 Potential to Achieve Goals: Good    Frequency 7X/week   Barriers to discharge        Co-evaluation               AM-PAC PT "6 Clicks" Mobility  Outcome Measure Help needed turning from your back to your side while in a flat bed without using bedrails?: A Little Help needed moving from lying on your back to sitting on the side of a flat bed without using bedrails?: A Little Help needed moving to and from a bed to a chair (including a wheelchair)?: A Little Help needed standing up from a chair using your arms (e.g., wheelchair or bedside chair)?: A Little Help needed to walk in hospital room?: A Little Help needed climbing 3-5 steps with a railing? : A Lot 6 Click Score: 17    End of Session Equipment Utilized During Treatment: Gait belt;Right knee immobilizer Activity Tolerance: Patient tolerated treatment well Patient left: in chair;with call bell/phone within reach;with chair alarm set Nurse Communication: Mobility status PT Visit Diagnosis: Other abnormalities of gait and mobility (R26.89)    Time: [id number] PT Time Calculation (min) (ACUTE ONLY): 32 min   Charges:   PT Evaluation $PT Eval Low Complexity: 1 Low PT Treatments $Gait Training: 8-22 mins        Baxter Flattery, PT  Acute Rehab Dept ([place]) [phone] Pager [phone]  05/20/2021   [facility] 05/20/2021, 3:26 PM

## 2021-05-20 NOTE — Care Plan (Signed)
Ortho Bundle Case Management Note  Patient Details  Name: Angela Barber MRN: [id number] Date of Birth: December 12, 1935                  R TKA on 05/20/21. DCP: Home with daughter. 1 story home with 1 step. DME: RW & 3in1 ordered through [facility]. PT: EO 9/1   DME Arranged:  3-N-1, Walker rolling DME Agency:  [employer]  HH Arranged:    [place] Agency:     Additional Comments: Please contact me with any questions of if this plan should need to change.  Marianne Sofia, RN,CCM [facility]  [phone] 05/20/2021, 9:24 AM

## 2021-05-20 NOTE — Anesthesia Postprocedure Evaluation (Signed)
Anesthesia Post Note  Patient: Angela Barber  Procedure(s) Performed: TOTAL KNEE ARTHROPLASTY (Right: Knee)     Patient location during evaluation: PACU Anesthesia Type: Spinal Level of consciousness: oriented and awake and alert Pain management: pain level controlled Vital Signs Assessment: post-procedure vital signs reviewed and stable Respiratory status: spontaneous breathing, respiratory function stable and nonlabored ventilation Cardiovascular status: blood pressure returned to baseline and stable Postop Assessment: no headache, no backache, no apparent nausea or vomiting, spinal receding and patient able to bend at knees Anesthetic complications: no   No notable events documented.  Last Vitals:  Vitals:   05/20/21 0945 05/20/21 1000  BP: 113/69 112/75  Pulse: 84 85  Resp: 19 13  Temp:    SpO2: 95% 95%    Last Pain:  Vitals:   05/20/21 0915  TempSrc:   PainSc: 0-No pain                 Rayshell Goecke A.

## 2021-05-20 NOTE — Transfer of Care (Signed)
Immediate Anesthesia Transfer of Care Note  Patient: Angela Barber  Procedure(s) Performed: TOTAL KNEE ARTHROPLASTY (Right: Knee)  Patient Location: PACU  Anesthesia Type:Spinal and MAC combined with regional for post-op pain  Level of Consciousness: awake, drowsy and responds to stimulation  Airway & Oxygen Therapy: Patient Spontanous Breathing and Patient connected to face mask oxygen  Post-op Assessment: Report given to RN and Post -op Vital signs reviewed and stable  Post vital signs: Reviewed and stable  Last Vitals:  Vitals Value Taken Time  BP 96/58 05/20/21 0846  Temp    Pulse 83 05/20/21 0847  Resp 14 05/20/21 0847  SpO2 100 % 05/20/21 0847  Vitals shown include unvalidated device data.  Last Pain:  Vitals:   05/20/21 0556  TempSrc: Oral  PainSc: 0-No pain      Patients Stated Pain Goal: 4 (15/05/69 7948)  Complications: No notable events documented.

## 2021-05-20 NOTE — Op Note (Signed)
OPERATIVE REPORT-TOTAL KNEE ARTHROPLASTY   Pre-operative diagnosis- Osteoarthritis  Right knee(s)  Post-operative diagnosis- Osteoarthritis Right knee(s)  Procedure-  Right  Total Knee Arthroplasty  Surgeon- Dione Plover. Hang Ammon, MD  Assistant- Molli Barrows, PA-C   Anesthesia-   Adductor canal block and spinal  EBL- 25 ml   Drains None  Tourniquet time- 31 minutes @ [id number] mm Hg  Complications- None  Condition-PACU - hemodynamically stable.   Brief Clinical Note  Angela Barber is a 85 y.o. year old female with end stage OA of her right knee with progressively worsening pain and dysfunction. She has constant pain, with activity and at rest and significant functional deficits with difficulties even with ADLs. She has had extensive non-op management including analgesics, injections of cortisone and viscosupplements, and home exercise program, but remains in significant pain with significant dysfunction.Radiographs show bone on bone arthritis lateral and patellofemoral. She presents now for right Total Knee Arthroplasty.     Procedure in detail---   The patient is brought into the operating room and positioned supine on the operating table. After successful administration of  Adductor canal block and spinal,   a tourniquet is placed high on the  Right thigh(s) and the lower extremity is prepped and draped in the usual sterile fashion. Time out is performed by the operating team and then the  Right lower extremity is wrapped in Esmarch, knee flexed and the tourniquet inflated to 300 mmHg.       A midline incision is made with a ten blade through the subcutaneous tissue to the level of the extensor mechanism. A fresh blade is used to make a medial parapatellar arthrotomy. Soft tissue over the proximal medial tibia is subperiosteally elevated to the joint line with a knife and into the semimembranosus bursa with a Cobb elevator. Soft tissue over the proximal lateral tibia is elevated with  attention being paid to avoiding the patellar tendon on the tibial tubercle. The patella is everted, knee flexed 90 degrees and the ACL and PCL are removed. Findings are bone on bone lateral and patellofemoral with large global osteophytes.        The drill is used to create a starting hole in the distal femur and the canal is thoroughly irrigated with sterile saline to remove the fatty contents. The 5 degree Right  valgus alignment guide is placed into the femoral canal and the distal femoral cutting block is pinned to remove 10 mm off the distal femur. Resection is made with an oscillating saw.      The tibia is subluxed forward and the menisci are removed. The extramedullary alignment guide is placed referencing proximally at the medial aspect of the tibial tubercle and distally along the second metatarsal axis and tibial crest. The block is pinned to remove 4m off the more deficient lateral  side. Resection is made with an oscillating saw. Size 3is the most appropriate size for the tibia and the proximal tibia is prepared with the modular drill and keel punch for that size.      The femoral sizing guide is placed and size 4 is most appropriate. Rotation is marked off the epicondylar axis and confirmed by creating a rectangular flexion gap at 90 degrees. The size 4 cutting block is pinned in this rotation and the anterior, posterior and chamfer cuts are made with the oscillating saw. The intercondylar block is then placed and that cut is made.      Trial size 3 tibial component, trial size  4 narrow posterior stabilized femur and a 12.5  mm posterior stabilized rotating platform insert trial is placed. Full extension is achieved with excellent varus/valgus and anterior/posterior balance throughout full range of motion. The patella is everted and thickness measured to be 23  mm. Free hand resection is taken to 13 mm, a 35 template is placed, lug holes are drilled, trial patella is placed, and it tracks  normally. Osteophytes are removed off the posterior femur with the trial in place. All trials are removed and the cut bone surfaces prepared with pulsatile lavage. Cement is mixed and once ready for implantation, the size 3 tibial implant, size  4 narrow posterior stabilized femoral component, and the size 35 patella are cemented in place and the patella is held with the clamp. The trial insert is placed and the knee held in full extension. The Exparel (20 ml mixed with 60 ml saline) is injected into the extensor mechanism, posterior capsule, medial and lateral gutters and subcutaneous tissues.  All extruded cement is removed and once the cement is hard the permanent 12.5 mm posterior stabilized rotating platform insert is placed into the tibial tray.      The wound is copiously irrigated with saline solution and the extensor mechanism closed with # 0 Stratofix suture. The tourniquet is released for a total tourniquet time of 31  minutes. Flexion against gravity is 140 degrees and the patella tracks normally. Subcutaneous tissue is closed with 2.0 vicryl and subcuticular with running 4.0 Monocryl. The incision is cleaned and dried and steri-strips and a bulky sterile dressing are applied. The limb is placed into a knee immobilizer and the patient is awakened and transported to recovery in stable condition.      Please note that a surgical assistant was a medical necessity for this procedure in order to perform it in a safe and expeditious manner. Surgical assistant was necessary to retract the ligaments and vital neurovascular structures to prevent injury to them and also necessary for proper positioning of the limb to allow for anatomic placement of the prosthesis.   Dione Plover Jett Fukuda, MD    05/20/2021, 8:17 AM

## 2021-05-21 DIAGNOSIS — Z87891 Personal history of nicotine dependence: Secondary | ICD-10-CM | POA: Diagnosis not present

## 2021-05-21 DIAGNOSIS — M1711 Unilateral primary osteoarthritis, right knee: Secondary | ICD-10-CM | POA: Diagnosis not present

## 2021-05-21 LAB — BASIC METABOLIC PANEL
Anion gap: 8 (ref 5–15)
BUN: 13 mg/dL (ref 8–23)
CO2: 26 mmol/L (ref 22–32)
Calcium: 9 mg/dL (ref 8.9–10.3)
Chloride: 106 mmol/L (ref 98–111)
Creatinine, Ser: 0.6 mg/dL (ref 0.44–1.00)
GFR, Estimated: >60 mL/min (ref >60)
Glucose, Bld: 120 mg/dL — ABNORMAL HIGH (ref 70–99)
Potassium: 3.9 mmol/L (ref 3.5–5.1)
Sodium: 140 mmol/L (ref 135–145)

## 2021-05-21 LAB — CBC
HCT: 39.5 % (ref 36.0–46.0)
Hemoglobin: 12.9 g/dL (ref 12.0–15.0)
MCH: 31.2 pg (ref 26.0–34.0)
MCHC: 32.7 g/dL (ref 30.0–36.0)
MCV: 95.4 fL (ref 80.0–100.0)
Platelets: 241 10*3/uL (ref 150–400)
RBC: 4.14 MIL/uL (ref 3.87–5.11)
RDW: 12.9 % (ref 11.5–15.5)
WBC: 13.2 10*3/uL — ABNORMAL HIGH (ref 4.0–10.5)
nRBC: 0 % (ref 0.0–0.2)

## 2021-05-21 MED ORDER — HYDROCODONE-ACETAMINOPHEN 5-325 MG PO TABS: 1.0000 | ORAL_TABLET | ORAL | 0 refills | Status: DC | PRN

## 2021-05-21 MED ORDER — SODIUM CHLORIDE 0.9 % IV BOLUS
250.0000 mL | Freq: Once | INTRAVENOUS | Status: AC
  Administered 2021-05-21: 250 mL via INTRAVENOUS

## 2021-05-21 MED ORDER — HYDROCODONE-ACETAMINOPHEN 5-325 MG PO TABS: 1.0000 | ORAL_TABLET | Freq: Four times a day (QID) | ORAL | 0 refills | Status: DC | PRN

## 2021-05-21 MED ORDER — PREDNISOL ACE-MOXIFLOX-BROMFEN 1-0.5-0.075 % OP SUSP: 1.0000 [drp] | Freq: Every day | OPHTHALMIC | Status: DC

## 2021-05-21 MED ORDER — ASPIRIN 325 MG PO TBEC: 325.0000 mg | DELAYED_RELEASE_TABLET | Freq: Two times a day (BID) | ORAL | 0 refills | Status: AC

## 2021-05-21 MED ORDER — CHOLESTYRAMINE LIGHT 4 G PO PACK
4.0000 g | PACK | Freq: Every day | ORAL | Status: DC
  Administered 2021-05-21 – 2021-05-22 (×2): 4 g via ORAL
  Filled 2021-05-21 (×2): qty 1

## 2021-05-21 MED ORDER — METHOCARBAMOL 500 MG PO TABS: 500.0000 mg | ORAL_TABLET | Freq: Four times a day (QID) | ORAL | 0 refills | Status: DC | PRN

## 2021-05-21 MED ORDER — TRAMADOL HCL 50 MG PO TABS: 50.0000 mg | ORAL_TABLET | Freq: Four times a day (QID) | ORAL | 0 refills | Status: DC | PRN

## 2021-05-21 NOTE — Progress Notes (Signed)
Physical Therapy Treatment Patient Details Name: Angela Barber MRN: [id number] DOB: 01-03-1936 Today's Date: 05/21/2021    History of Present Illness s/p R TKA PMHx: cochlear implant, anxiety, bil RCR    PT Comments    Pt progressing however hasn't met goals yet. May need another day to incr independence and safety for return home with family assist    Follow Up Recommendations  Follow surgeon's recommendation for DC plan and follow-up therapies     Equipment Recommendations  None recommended by PT    Recommendations for Other Services       Precautions / Restrictions Precautions Precautions: Knee Required Braces or Orthoses: Knee Immobilizer - Right Knee Immobilizer - Right: Discontinue once straight leg raise with < 10 degree lag Restrictions Weight Bearing Restrictions: No    Mobility  Bed Mobility Overal bed mobility: Needs Assistance Bed Mobility: Supine to Sit     Supine to sit: Min guard     General bed mobility comments: incr time, cues for sequence    Transfers Overall transfer level: Needs assistance Equipment used: Rolling walker (2 wheeled) Transfers: Sit to/from [facility] Sit to Stand: Min assist Stand pivot transfers: Min assist;Min guard       General transfer comment: cues for hand placement and RLE position  Ambulation/Gait Ambulation/Gait assistance: Min guard;Min assist Gait Distance (Feet): 40 Feet Assistive device: Rolling walker (2 wheeled) Gait Pattern/deviations: Step-to pattern Gait velocity: decr   General Gait Details: cues for sequence and RW position, intermittent assist for balance and to maneuver RW   Stairs             Wheelchair Mobility    Modified Rankin (Stroke Patients Only)       Balance             Standing balance-Leahy Scale: Poor Standing balance comment: reliant on UEs                            Cognition Arousal/Alertness: Awake/alert Behavior During  Therapy: WFL for tasks assessed/performed Overall Cognitive Status: Within Functional Limits for tasks assessed                                        Exercises Total Joint Exercises Ankle Circles/Pumps: AROM;Both;10 reps Quad Sets: AROM;Both;10 reps Heel Slides: AROM;AAROM;Right;10 reps Hip ABduction/ADduction: AROM;AAROM;Right;10 reps Straight Leg Raises: AROM;AAROM;Right;10 reps    General Comments        Pertinent Vitals/Pain Pain Assessment: 0-10 Pain Score: 4  Pain Location: right knee Pain Descriptors / Indicators: Aching;Grimacing;Sore Pain Intervention(s): Limited activity within patient's tolerance;Monitored during session;Premedicated before session;Repositioned;Ice applied    Home Living                      Prior Function            PT Goals (current goals can now be found in the care plan section) Acute Rehab PT Goals Patient Stated Goal: home soon PT Goal Formulation: With patient Time For Goal Achievement: 06/03/21 Potential to Achieve Goals: Good Progress towards PT goals: Progressing toward goals    Frequency    7X/week      PT Plan Current plan remains appropriate    Co-evaluation              AM-PAC PT "6 Clicks" Mobility   Outcome  Measure  Help needed turning from your back to your side while in a flat bed without using bedrails?: A Little Help needed moving from lying on your back to sitting on the side of a flat bed without using bedrails?: A Little Help needed moving to and from a bed to a chair (including a wheelchair)?: A Little Help needed standing up from a chair using your arms (e.g., wheelchair or bedside chair)?: A Little Help needed to walk in hospital room?: A Little Help needed climbing 3-5 steps with a railing? : A Lot 6 Click Score: 17    End of Session Equipment Utilized During Treatment: Gait belt;Right knee immobilizer Activity Tolerance: Patient tolerated treatment well Patient left:  in chair;with call bell/phone within reach;with chair alarm set;Other (comment) (BSC, RN aware)   PT Visit Diagnosis: Other abnormalities of gait and mobility (R26.89)     Time: 2473-1924 PT Time Calculation (min) (ACUTE ONLY): 39 min  Charges:  $Gait Training: 8-22 mins $Therapeutic Exercise: 8-22 mins $Therapeutic Activity: 8-22 mins                     Baxter Flattery, PT  Acute Rehab Dept ([place]) [phone] Pager [phone]  05/21/2021    [facility] 05/21/2021, 12:25 PM

## 2021-05-21 NOTE — Progress Notes (Signed)
   Subjective: 1 Day Post-Op Procedure(s) (LRB): TOTAL KNEE ARTHROPLASTY (Right) Patient reports pain as mild.   Patient seen in rounds by Dr. Wynelle Link. Patient is well, and has had no acute complaints or problems other than soreness in the right knee. No issues overnight. Denies chest pain, SOB or calf pain. Foley catheter to be removed this AM. We will continue therapy today.   Objective: Vital signs in last 24 hours: Temp:  [97.5 F (36.4 C)-97.9 F (36.6 C)] 97.9 F (36.6 C) (08/30 0610) Pulse Rate:  [70-92] 70 (08/30 0610) Resp:  [11-19] 17 (08/30 0610) BP: (89-134)/(57-85) 89/57 (08/30 0610) SpO2:  [90 %-100 %] 90 % (08/30 0610)  Intake/Output from previous day:  Intake/Output Summary (Last 24 hours) at 05/21/2021 0734 Last data filed at 05/21/2021 0630 Gross per 24 hour  Intake 2879.05 ml  Output 2730 ml  Net 149.05 ml     Intake/Output this shift: No intake/output data recorded.  Labs: Recent Labs    05/21/21 0315  HGB 12.9   Recent Labs    05/21/21 0315  WBC 13.2*  RBC 4.14  HCT 39.5  PLT 241   Recent Labs    05/21/21 0315  NA 140  K 3.9  CL 106  CO2 26  BUN 13  CREATININE 0.60  GLUCOSE 120*  CALCIUM 9.0   No results for input(s): LABPT, INR in the last 72 hours.  Exam: General - Patient is Alert and Oriented Extremity - Neurologically intact Neurovascular intact Sensation intact distally Dorsiflexion/Plantar flexion intact Dressing - dressing C/D/I Motor Function - intact, moving foot and toes well on exam.   Past Medical History:  Diagnosis Date   Anxiety    C. difficile diarrhea 05/23/2005   Cochlear implant status    Depression    Diverticulosis    GERD (gastroesophageal reflux disease)    Hearing loss of both ears    HLD (hyperlipidemia)    Hyperplastic colon polyp    Insomnia    Internal hemorrhoid    Osteoarthritis, knee     Assessment/Plan: 1 Day Post-Op Procedure(s) (LRB): TOTAL KNEE ARTHROPLASTY (Right) Principal  Problem:   Primary osteoarthritis of both knees Active Problems:   OA (osteoarthritis) of knee  Estimated body mass index is 26.32 kg/m as calculated from the following:   Height as of this encounter: '5\' 3"'$  (1.6 m).   Weight as of this encounter: 67.4 kg. Advance diet Up with therapy D/C IV fluids   Patient's anticipated LOS is less than 2 midnights, meeting these requirements: - Lives within 1 hour of care - Has a competent adult at home to recover with post-op recover - NO history of  - Chronic pain requiring opiods  - Diabetes  - Coronary Artery Disease  - Heart failure  - Heart attack  - Stroke  - DVT/VTE  - Cardiac arrhythmia  - Respiratory Failure/COPD  - Renal failure  - Anemia  - Advanced Liver disease  DVT Prophylaxis - Aspirin Weight bearing as tolerated. Continue therapy.  BP soft this AM, 250 mL bolus ordered.  Plan is to go Home after hospital stay. Possible discharge later today if progresses with therapy and meeting goals. Scheduled for OPPT at [facility] Follow-up in the office in 2 weeks  The PDMP database was reviewed today prior to any opioid medications being prescribed to this patient.  Theresa Duty, PA-C Orthopedic Surgery [phone] 05/21/2021, 7:34 AM

## 2021-05-21 NOTE — TOC Transition Note (Signed)
Transition of Care [facility]) - CM/SW Discharge Note   Patient Details  Name: Angela Barber MRN: [id number] Date of Birth: 07/27/1936  [facility] CM/SW Contact:  Lennart Pall, LCSW Phone Number: 05/21/2021, 12:39 PM   Clinical Narrative:    Met with pt and daughter and confirming receipt of DME from [facility].  Plan for OPPT at [facility].  TOC signing off.   Final next level of care: OP Rehab Barriers to Discharge: No Barriers Identified   Patient Goals and CMS Choice Patient states their goals for this hospitalization and ongoing recovery are:: return home      Discharge Placement                       Discharge Plan and Services                DME Arranged: 3-N-1, Walker rolling DME Agency: [employer] (prearranged by MD office)                  Social Determinants of Health (SDOH) Interventions     Readmission Risk Interventions No flowsheet data found.

## 2021-05-21 NOTE — Progress Notes (Signed)
Physical Therapy Treatment Patient Details Name: Angela Barber MRN: [id number] DOB: 22-Jun-1936 Today's Date: 05/21/2021    History of Present Illness s/p R TKA PMHx: cochlear implant, anxiety, bil RCR    PT Comments    Pt progressing however overall still min assist. Will benefit from another day to incr independence and safety, work on stair training   Follow Up Recommendations  Follow surgeon's recommendation for DC plan and follow-up therapies     Equipment Recommendations  None recommended by PT    Recommendations for Other Services       Precautions / Restrictions Precautions Precautions: Knee Required Braces or Orthoses: Knee Immobilizer - Right Knee Immobilizer - Right: Discontinue once straight leg raise with < 10 degree lag Restrictions Weight Bearing Restrictions: No    Mobility  Bed Mobility Overal bed mobility: Needs Assistance Bed Mobility: Supine to Sit;Sit to Supine     Supine to sit: Min guard Sit to supine: Min guard   General bed mobility comments: incr time, cues for sequence    Transfers Overall transfer level: Needs assistance Equipment used: Rolling walker (2 wheeled) Transfers: Sit to/from [facility] Sit to Stand: Min assist Stand pivot transfers: Min assist;Min guard       General transfer comment: cues for hand placement and RLE position  Ambulation/Gait Ambulation/Gait assistance: Min guard;Min assist Gait Distance (Feet): 50 Feet Assistive device: Rolling walker (2 wheeled) Gait Pattern/deviations: Step-to pattern Gait velocity: decr   General Gait Details: cues for sequence and RW position, intermittent assist for balance and to maneuver RW   Stairs             Wheelchair Mobility    Modified Rankin (Stroke Patients Only)       Balance             Standing balance-Leahy Scale: Poor Standing balance comment: reliant on UEs                            Cognition  Arousal/Alertness: Awake/alert Behavior During Therapy: WFL for tasks assessed/performed Overall Cognitive Status: Within Functional Limits for tasks assessed                                        Exercises Total Joint Exercises Ankle Circles/Pumps: AROM;Both;10 reps    General Comments General comments (skin integrity, edema, etc.): positioned in RL Eknee extension stretch with towel under ankle      Pertinent Vitals/Pain Pain Assessment: 0-10 Pain Score: 6  Pain Location: right knee Pain Descriptors / Indicators: Aching;Grimacing;Sore Pain Intervention(s): Limited activity within patient's tolerance;Monitored during session;Premedicated before session;Ice applied    Home Living                      Prior Function            PT Goals (current goals can now be found in the care plan section) Acute Rehab PT Goals Patient Stated Goal: home soon PT Goal Formulation: With patient Time For Goal Achievement: 06/03/21 Potential to Achieve Goals: Good Progress towards PT goals: Progressing toward goals    Frequency    7X/week      PT Plan Current plan remains appropriate    Co-evaluation              AM-PAC PT "6 Clicks" Mobility   Outcome  Measure  Help needed turning from your back to your side while in a flat bed without using bedrails?: A Little Help needed moving from lying on your back to sitting on the side of a flat bed without using bedrails?: A Little Help needed moving to and from a bed to a chair (including a wheelchair)?: A Little Help needed standing up from a chair using your arms (e.g., wheelchair or bedside chair)?: A Little Help needed to walk in hospital room?: A Little Help needed climbing 3-5 steps with a railing? : A Lot 6 Click Score: 17    End of Session Equipment Utilized During Treatment: Gait belt;Right knee immobilizer Activity Tolerance: Patient tolerated treatment well Patient left: in chair;with call  bell/phone within reach;with chair alarm set;Other (comment) (BSC, RN aware)   PT Visit Diagnosis: Other abnormalities of gait and mobility (R26.89)     Time: [id number] PT Time Calculation (min) (ACUTE ONLY): 37 min  Charges:  $Gait Training: 8-22 mins $Therapeutic Activity: 8-22 mins                     Baxter Flattery, PT  Acute Rehab Dept ([place]) [phone] Pager [phone]  05/21/2021    [facility] 05/21/2021, 4:28 PM

## 2021-05-22 ENCOUNTER — Encounter (HOSPITAL_COMMUNITY): Payer: Self-pay | Admitting: Orthopedic Surgery

## 2021-05-22 DIAGNOSIS — M1711 Unilateral primary osteoarthritis, right knee: Secondary | ICD-10-CM | POA: Diagnosis not present

## 2021-05-22 DIAGNOSIS — Z87891 Personal history of nicotine dependence: Secondary | ICD-10-CM | POA: Diagnosis not present

## 2021-05-22 DIAGNOSIS — M25561 Pain in right knee: Secondary | ICD-10-CM | POA: Diagnosis not present

## 2021-05-22 LAB — BASIC METABOLIC PANEL
Anion gap: 9 (ref 5–15)
BUN: 17 mg/dL (ref 8–23)
CO2: 27 mmol/L (ref 22–32)
Calcium: 9.2 mg/dL (ref 8.9–10.3)
Chloride: 106 mmol/L (ref 98–111)
Creatinine, Ser: 0.62 mg/dL (ref 0.44–1.00)
GFR, Estimated: >60 mL/min (ref >60)
Glucose, Bld: 112 mg/dL — ABNORMAL HIGH (ref 70–99)
Potassium: 4 mmol/L (ref 3.5–5.1)
Sodium: 142 mmol/L (ref 135–145)

## 2021-05-22 LAB — CBC
HCT: 38.9 % (ref 36.0–46.0)
Hemoglobin: 12.9 g/dL (ref 12.0–15.0)
MCH: 31.2 pg (ref 26.0–34.0)
MCHC: 33.2 g/dL (ref 30.0–36.0)
MCV: 94 fL (ref 80.0–100.0)
Platelets: 232 10*3/uL (ref 150–400)
RBC: 4.14 MIL/uL (ref 3.87–5.11)
RDW: 13.1 % (ref 11.5–15.5)
WBC: 11.7 10*3/uL — ABNORMAL HIGH (ref 4.0–10.5)
nRBC: 0 % (ref 0.0–0.2)

## 2021-05-22 NOTE — Progress Notes (Signed)
   Subjective: 2 Days Post-Op Procedure(s) (LRB): TOTAL KNEE ARTHROPLASTY (Right) Patient reports pain as moderate.   Patient seen in rounds by Dr. Wynelle Link. Patient is well, and has had no acute complaints or problems. Denies SOB, chest pain, or calf pain. No acute overnight events. Ambulated 50 feet with therapy yesterday. Will continue therapy today.    Objective: Vital signs in last 24 hours: Temp:  [97.7 F (36.5 C)] 97.7 F (36.5 C) (08/30 2047) Pulse Rate:  [61-69] 61 (08/31 0535) Resp:  [16-18] 16 (08/31 0535) BP: (106-111)/(64-77) 109/64 (08/31 0535) SpO2:  [89 %-97 %] 89 % (08/31 0535)  Intake/Output from previous day:  Intake/Output Summary (Last 24 hours) at 05/22/2021 0822 Last data filed at 05/22/2021 0600 Gross per 24 hour  Intake 1695.44 ml  Output 250 ml  Net 1445.44 ml     Intake/Output this shift: No intake/output data recorded.  Labs: Recent Labs    05/21/21 0315 05/22/21 0322  HGB 12.9 12.9   Recent Labs    05/21/21 0315 05/22/21 0322  WBC 13.2* 11.7*  RBC 4.14 4.14  HCT 39.5 38.9  PLT 241 232   Recent Labs    05/21/21 0315 05/22/21 0322  NA 140 142  K 3.9 4.0  CL 106 106  CO2 26 27  BUN 13 17  CREATININE 0.60 0.62  GLUCOSE 120* 112*  CALCIUM 9.0 9.2   No results for input(s): LABPT, INR in the last 72 hours.  Exam: General - Patient is Alert and Oriented Extremity - Neurologically intact Neurovascular intact Intact pulses distally Dorsiflexion/Plantar flexion intact Dressing - dressing C/D/I Motor Function - intact, moving foot and toes well on exam.   Past Medical History:  Diagnosis Date   Anxiety    C. difficile diarrhea 05/23/2005   Cochlear implant status    Depression    Diverticulosis    GERD (gastroesophageal reflux disease)    Hearing loss of both ears    HLD (hyperlipidemia)    Hyperplastic colon polyp    Insomnia    Internal hemorrhoid    Osteoarthritis, knee     Assessment/Plan: 2 Days Post-Op  Procedure(s) (LRB): TOTAL KNEE ARTHROPLASTY (Right) Principal Problem:   Primary osteoarthritis of both knees Active Problems:   OA (osteoarthritis) of knee  Estimated body mass index is 26.32 kg/m as calculated from the following:   Height as of this encounter: '5\' 3"'$  (1.6 m).   Weight as of this encounter: 67.4 kg. Up with therapy    DVT Prophylaxis - Aspirin and TED hose Weight bearing as tolerated. Continue therapy.  Plan for one session with PT this morning, and if meeting goals, will plan for discharge this afternoon.   Patient to follow up in two weeks with Dr. Wynelle Link in clinic.   The PDMP database was reviewed today prior to any opioid medications being prescribed to this patient..   Plan is to go Home after hospital stay.  Fenton Foy, MBA, PA-C Orthopedic Surgery 05/22/2021, 8:22 AM

## 2021-05-22 NOTE — Progress Notes (Signed)
Physical Therapy Treatment Patient Details Name: Angela Barber MRN: [id number] DOB: 11-10-1935 Today's Date: 05/22/2021    History of Present Illness s/p R TKA PMHx: cochlear implant, anxiety, bil RCR    PT Comments    Pt is making excellent progress. Meeting goals. Ready for d/c from PT standpoint with family assist prn   Follow Up Recommendations  Follow surgeon's recommendation for DC plan and follow-up therapies     Equipment Recommendations  None recommended by PT    Recommendations for Other Services       Precautions / Restrictions Precautions Precautions: Knee Precaution Comments: pt is able to do SLRs, feels more "secure", less pain with KI inplace, discussed maintaining KI for stairs Required Braces or Orthoses: Knee Immobilizer - Right Knee Immobilizer - Right: Discontinue once straight leg raise with < 10 degree lag Restrictions Weight Bearing Restrictions: No Other Position/Activity Restrictions: WBAT    Mobility  Bed Mobility Overal bed mobility: Needs Assistance Bed Mobility: Supine to Sit     Supine to sit: Supervision;HOB elevated     General bed mobility comments: incr time, cues for sequence    Transfers   Equipment used: Rolling walker (2 wheeled) Transfers: Sit to/from Stand Sit to Stand: Min guard;Supervision         General transfer comment: cues for hand placement and RLE position; demonstrating good carryover end of session  Ambulation/Gait Ambulation/Gait assistance: Min Gaffer (Feet): 80 Feet Assistive device: Rolling walker (2 wheeled) Gait Pattern/deviations: Step-to pattern;Step-through pattern;Decreased stance time - right Gait velocity: decr   General Gait Details: cues for sequence, improving wt shift and beginning step through gait withl ess reliance on UEs   Stairs Stairs: Yes Stairs assistance: Min guard;Min assist Stair Management: No rails;Step to pattern;Backwards;With walker Number  of Stairs: 1 (x2) General stair comments: cues for sequence and RW position   Wheelchair Mobility    Modified Rankin (Stroke Patients Only)       Balance Overall balance assessment: Needs assistance   Sitting balance-Leahy Scale: Good       Standing balance-Leahy Scale: Fair Standing balance comment: brielfy abl eto maintain static stand without UEs support                            Cognition Arousal/Alertness: Awake/alert Behavior During Therapy: WFL for tasks assessed/performed Overall Cognitive Status: Within Functional Limits for tasks assessed                                        Exercises Total Joint Exercises Ankle Circles/Pumps: AROM;Both;10 reps Quad Sets: AROM;Right;5 reps Straight Leg Raises: AROM;Right;Strengthening;5 reps    General Comments        Pertinent Vitals/Pain Pain Assessment: 0-10 Pain Score: 2  Pain Location: right knee Pain Descriptors / Indicators: Aching;Grimacing;Sore Pain Intervention(s): Limited activity within patient's tolerance;Monitored during session;Premedicated before session;Repositioned    Home Living                      Prior Function            PT Goals (current goals can now be found in the care plan section) Acute Rehab PT Goals Patient Stated Goal: home soon PT Goal Formulation: With patient Time For Goal Achievement: 06/03/21 Potential to Achieve Goals: Good Progress towards PT goals: Progressing toward goals  Frequency    7X/week      PT Plan Current plan remains appropriate    Co-evaluation              AM-PAC PT "6 Clicks" Mobility   Outcome Measure  Help needed turning from your back to your side while in a flat bed without using bedrails?: A Little Help needed moving from lying on your back to sitting on the side of a flat bed without using bedrails?: None Help needed moving to and from a bed to a chair (including a wheelchair)?: A  Little Help needed standing up from a chair using your arms (e.g., wheelchair or bedside chair)?: A Little Help needed to walk in hospital room?: A Little Help needed climbing 3-5 steps with a railing? : A Little 6 Click Score: 19    End of Session Equipment Utilized During Treatment: Gait belt;Right knee immobilizer Activity Tolerance: Patient tolerated treatment well Patient left: in chair;with call bell/phone within reach;with chair alarm set;with family/visitor present Nurse Communication: Mobility status PT Visit Diagnosis: Other abnormalities of gait and mobility (R26.89)     Time: [id number] PT Time Calculation (min) (ACUTE ONLY): 35 min  Charges:  $Gait Training: 23-37 mins                     Baxter Flattery, PT  Acute Rehab Dept ([place]) [phone] Pager [phone]  05/22/2021    [facility] 05/22/2021, 9:45 AM

## 2021-05-22 NOTE — Progress Notes (Signed)
Orthopedic Tech Progress Note Patient Details:  Angela Barber [birth year] [id number]  Patient ID: [name], female   DOB: [birth year], 85 y.o.   MRN: [id number]  [name] 05/22/2021, 11:12 AMCPM AND OHF PICKUP.

## 2021-05-23 DIAGNOSIS — M25561 Pain in right knee: Secondary | ICD-10-CM | POA: Diagnosis not present

## 2021-05-29 DIAGNOSIS — M25561 Pain in right knee: Secondary | ICD-10-CM | POA: Diagnosis not present

## 2021-05-29 NOTE — Discharge Summary (Signed)
Physician Discharge Summary   Patient ID: Angela Barber MRN: [id number] DOB/AGE: [birth year] 83 y.o.  Admit date: 05/20/2021 Discharge date: 05/22/2021  Primary Diagnosis: Osteoarthritis, right knee   Admission Diagnoses:  Past Medical History:  Diagnosis Date   Anxiety    C. difficile diarrhea 05/23/2005   Cochlear implant status    Depression    Diverticulosis    GERD (gastroesophageal reflux disease)    Hearing loss of both ears    HLD (hyperlipidemia)    Hyperplastic colon polyp    Insomnia    Internal hemorrhoid    Osteoarthritis, knee    Discharge Diagnoses:   Principal Problem:   Primary osteoarthritis of both knees Active Problems:   OA (osteoarthritis) of knee  Estimated body mass index is 26.32 kg/m as calculated from the following:   Height as of this encounter: '5\' 3"'$  (1.6 m).   Weight as of this encounter: 67.4 kg.  Procedure:  Procedure(s) (LRB): TOTAL KNEE ARTHROPLASTY (Right)   Consults: None  HPI: Angela Barber is a 85 y.o. year old female with end stage OA of her right knee with progressively worsening pain and dysfunction. She has constant pain, with activity and at rest and significant functional deficits with difficulties even with ADLs. She has had extensive non-op management including analgesics, injections of cortisone and viscosupplements, and home exercise program, but remains in significant pain with significant dysfunction.Radiographs show bone on bone arthritis lateral and patellofemoral. She presents now for right Total Knee Arthroplasty.   Laboratory Data: Admission on 05/20/2021, Discharged on 05/22/2021  Component Date Value Ref Range Status   WBC 05/21/2021 13.2 (A) 4.0 - 10.5 K/uL Final   RBC 05/21/2021 4.14  3.87 - 5.11 MIL/uL Final   Hemoglobin 05/21/2021 12.9  12.0 - 15.0 g/dL Final   HCT 05/21/2021 39.5  36.0 - 46.0 % Final   MCV 05/21/2021 95.4  80.0 - 100.0 fL Final   MCH 05/21/2021 31.2  26.0 - 34.0 pg Final   MCHC  05/21/2021 32.7  30.0 - 36.0 g/dL Final   RDW 05/21/2021 12.9  11.5 - 15.5 % Final   Platelets 05/21/2021 241  150 - 400 K/uL Final   nRBC 05/21/2021 0.0  0.0 - 0.2 % Final   Performed at [facility], [place] [address], [place], [place] [address]   Sodium 05/21/2021 140  135 - 145 mmol/L Final   Potassium 05/21/2021 3.9  3.5 - 5.1 mmol/L Final   Chloride 05/21/2021 106  98 - 111 mmol/L Final   CO2 05/21/2021 26  22 - 32 mmol/L Final   Glucose, Bld 05/21/2021 120 (A) 70 - 99 mg/dL Final   Glucose reference range applies only to samples taken after fasting for at least 8 hours.   BUN 05/21/2021 13  8 - 23 mg/dL Final   Creatinine, Ser 05/21/2021 0.60  0.44 - 1.00 mg/dL Final   Calcium 05/21/2021 9.0  8.9 - 10.3 mg/dL Final   GFR, Estimated 05/21/2021 >60  >60 mL/min Final   Comment: (NOTE) Calculated using the CKD-EPI Creatinine Equation (2021)    Anion gap 05/21/2021 8  5 - 15 Final   Performed at [facility], [place] [address] [place], [place] [address]   WBC 05/22/2021 11.7 (A) 4.0 - 10.5 K/uL Final   RBC 05/22/2021 4.14  3.87 - 5.11 MIL/uL Final   Hemoglobin 05/22/2021 12.9  12.0 - 15.0 g/dL Final   HCT 05/22/2021 38.9  36.0 - 46.0 % Final  MCV 05/22/2021 94.0  80.0 - 100.0 fL Final   MCH 05/22/2021 31.2  26.0 - 34.0 pg Final   MCHC 05/22/2021 33.2  30.0 - 36.0 g/dL Final   RDW 05/22/2021 13.1  11.5 - 15.5 % Final   Platelets 05/22/2021 232  150 - 400 K/uL Final   nRBC 05/22/2021 0.0  0.0 - 0.2 % Final   Performed at [facility], [place] [address] [place], [place] [address]   Sodium 05/22/2021 142  135 - 145 mmol/L Final   Potassium 05/22/2021 4.0  3.5 - 5.1 mmol/L Final   Chloride 05/22/2021 106  98 - 111 mmol/L Final   CO2 05/22/2021 27  22 - 32 mmol/L Final   Glucose, Bld 05/22/2021 112 (A) 70 - 99 mg/dL Final   Glucose reference range applies only to samples taken after fasting for at least 8 hours.   BUN 05/22/2021 17   8 - 23 mg/dL Final   Creatinine, Ser 05/22/2021 0.62  0.44 - 1.00 mg/dL Final   Calcium 05/22/2021 9.2  8.9 - 10.3 mg/dL Final   GFR, Estimated 05/22/2021 >60  >60 mL/min Final   Comment: (NOTE) Calculated using the CKD-EPI Creatinine Equation (2021)    Anion gap 05/22/2021 9  5 - 15 Final   Performed at [facility], [place] [address] [place], [place] [address]  Orders Only on 05/16/2021  Component Date Value Ref Range Status   SARS Coronavirus 2 05/16/2021 RESULT: NEGATIVE   Final   Comment: RESULT: NEGATIVESARS-CoV-2 INTERPRETATION:A NEGATIVE  test result means that SARS-CoV-2 RNA was not present in the specimen above the limit of detection of this test. This does not preclude a possible SARS-CoV-2 infection and should not be used as the  sole basis for patient management decisions. Negative results must be combined with clinical observations, patient history, and epidemiological information. Optimum specimen types and timing for peak viral levels during infections caused by SARS-CoV-2  have not been determined. Collection of multiple specimens or types of specimens may be necessary to detect virus. Improper specimen collection and handling, sequence variability under primers/probes, or organism present below the limit of detection may  lead to false negative results. Positive and negative predictive values of testing are highly dependent on prevalence. False negative test results are more likely when prevalence of disease is high.The expected result is NEGATIVE.Fact S                          heet for  Healthcare Providers: LocalChronicle.no Sheet for Patients: SalonLookup.es Reference Range - Negative   Hospital Outpatient Visit on 05/07/2021  Component Date Value Ref Range Status   MRSA, PCR 05/07/2021 NEGATIVE  NEGATIVE Final   Staphylococcus aureus 05/07/2021 NEGATIVE  NEGATIVE Final   Comment: (NOTE) The  Xpert SA Assay (FDA approved for NASAL specimens in patients 68 years of age and older), is one component of a comprehensive surveillance program. It is not intended to diagnose infection nor to guide or monitor treatment. Performed at [facility], [place] [address] [place], [place] [address]    WBC 05/07/2021 8.5  4.0 - 10.5 K/uL Final   RBC 05/07/2021 4.84  3.87 - 5.11 MIL/uL Final   Hemoglobin 05/07/2021 15.2 (A) 12.0 - 15.0 g/dL Final   HCT 05/07/2021 45.9  36.0 - 46.0 % Final   MCV 05/07/2021 94.8  80.0 - 100.0 fL Final   MCH 05/07/2021 31.4  26.0 - 34.0  pg Final   MCHC 05/07/2021 33.1  30.0 - 36.0 g/dL Final   RDW 05/07/2021 13.0  11.5 - 15.5 % Final   Platelets 05/07/2021 268  150 - 400 K/uL Final   nRBC 05/07/2021 0.0  0.0 - 0.2 % Final   Performed at [facility], [place] [address], [place], [place] [address]   Sodium 05/07/2021 139  135 - 145 mmol/L Final   Potassium 05/07/2021 4.4  3.5 - 5.1 mmol/L Final   Chloride 05/07/2021 102  98 - 111 mmol/L Final   CO2 05/07/2021 28  22 - 32 mmol/L Final   Glucose, Bld 05/07/2021 94  70 - 99 mg/dL Final   Glucose reference range applies only to samples taken after fasting for at least 8 hours.   BUN 05/07/2021 20  8 - 23 mg/dL Final   Creatinine, Ser 05/07/2021 0.76  0.44 - 1.00 mg/dL Final   Calcium 05/07/2021 9.4  8.9 - 10.3 mg/dL Final   Total Protein 05/07/2021 7.0  6.5 - 8.1 g/dL Final   Albumin 05/07/2021 4.2  3.5 - 5.0 g/dL Final   AST 05/07/2021 19  15 - 41 U/L Final   ALT 05/07/2021 17  0 - 44 U/L Final   Alkaline Phosphatase 05/07/2021 52  38 - 126 U/L Final   Total Bilirubin 05/07/2021 1.0  0.3 - 1.2 mg/dL Final   GFR, Estimated 05/07/2021 >60  >60 mL/min Final   Comment: (NOTE) Calculated using the CKD-EPI Creatinine Equation (2021)    Anion gap 05/07/2021 9  5 - 15 Final   Performed at [facility], [place] [address] [place], [place] [address]     X-Rays:No  results found.  EKG: Orders placed or performed in visit on 05/17/21   EKG 12-Lead     Hospital Course: LEIMOMI CHRISTAKOS is a 85 y.o. who was admitted to [facility]. They were brought to the operating room on 05/20/2021 and underwent Procedure(s): TOTAL KNEE ARTHROPLASTY.  Patient tolerated the procedure well and was later transferred to the recovery room and then to the orthopaedic floor for postoperative care. They were given PO and IV analgesics for pain control following their surgery. They were given 24 hours of postoperative antibiotics of  Anti-infectives (From admission, onward)    Start     Dose/Rate Route Frequency Ordered Stop   05/20/21 1300  ceFAZolin (ANCEF) IVPB 2g/100 mL premix        2 g 200 mL/hr over 30 Minutes Intravenous Every 6 hours 05/20/21 1112 05/20/21 2031   05/20/21 0600  ceFAZolin (ANCEF) IVPB 2g/100 mL premix        2 g 200 mL/hr over 30 Minutes Intravenous On call to O.R. 05/20/21 [id number] 05/20/21 0740      and started on DVT prophylaxis in the form of Aspirin.   PT and OT were ordered for total joint protocol. Discharge planning consulted to help with postop disposition and equipment needs. Patient had a good night on the evening of surgery. They started to get up OOB with therapy on POD #0. BP was soft on day one, 250 mL bolus ordered. Continued to work with therapy into POD #2. Pt was seen during rounds on day two and was ready to go home pending progress with therapy. Dressing was changed and the incision was clean, dry and intact with Aquacel in place. Pt worked with therapy for one additional session and was meeting their goals. She was discharged to home later  that day in stable condition.  Diet: Regular diet Activity: WBAT Follow-up: in 2 weeks Disposition: Home with OPPT Discharged Condition: stable   Discharge Instructions     Call MD / Call 911   Complete by: As directed    If you experience chest pain or shortness of breath, CALL 911 and  be transported to the hospital emergency room.  If you develope a fever above 101 F, pus (white drainage) or increased drainage or redness at the wound, or calf pain, call your surgeon's office.   Change dressing   Complete by: As directed    You may remove the bulky bandage (ACE wrap and gauze) two days after surgery. You will have an adhesive waterproof bandage underneath. Leave this in place until your first follow-up appointment.   Constipation Prevention   Complete by: As directed    Drink plenty of fluids.  Prune juice may be helpful.  You may use a stool softener, such as Colace (over the counter) 100 mg twice a day.  Use MiraLax (over the counter) for constipation as needed.   Diet - low sodium heart healthy   Complete by: As directed    Do not put a pillow under the knee. Place it under the heel.   Complete by: As directed    Driving restrictions   Complete by: As directed    No driving for two weeks   Post-operative opioid taper instructions:   Complete by: As directed    POST-OPERATIVE OPIOID TAPER INSTRUCTIONS: It is important to wean off of your opioid medication as soon as possible. If you do not need pain medication after your surgery it is ok to stop day one. Opioids include: Codeine, Hydrocodone(Norco, Vicodin), Oxycodone(Percocet, oxycontin) and hydromorphone amongst others.  Long term and even short term use of opiods can cause: Increased pain response Dependence Constipation Depression Respiratory depression And more.  Withdrawal symptoms can include Flu like symptoms Nausea, vomiting And more Techniques to manage these symptoms Hydrate well Eat regular healthy meals Stay active Use relaxation techniques(deep breathing, meditating, yoga) Do Not substitute Alcohol to help with tapering If you have been on opioids for less than two weeks and do not have pain than it is ok to stop all together.  Plan to wean off of opioids This plan should start within one week  post op of your joint replacement. Maintain the same interval or time between taking each dose and first decrease the dose.  Cut the total daily intake of opioids by one tablet each day Next start to increase the time between doses. The last dose that should be eliminated is the evening dose.      TED hose   Complete by: As directed    Use stockings (TED hose) for three weeks on both leg(s).  You may remove them at night for sleeping.   Weight bearing as tolerated   Complete by: As directed       Allergies as of 05/22/2021       Reactions   Other    Egg plant        Medication List     TAKE these medications    acetaminophen 650 MG CR tablet Commonly known as: TYLENOL Take 650 mg by mouth every 8 (eight) hours as needed for pain.   aspirin 325 MG EC tablet Take 1 tablet (325 mg total) by mouth 2 (two) times daily for 20 days. Then take one 81 mg aspirin once a day for  three weeks. Then discontinue aspirin.   chlorhexidine 0.12 % solution Commonly known as: PERIDEX 15 mLs by Mouth Rinse route daily.   cholestyramine 4 g packet Commonly known as: QUESTRAN TAKE CONTENTS  OF 1  PACKET  (DISSOLVE AS DIRECTED)  BY MOUTH DAILY   diphenhydramine-acetaminophen 25-500 MG Tabs tablet Commonly known as: TYLENOL PM Take 1 tablet by mouth at bedtime as needed (sleep).   fluticasone 50 MCG/ACT nasal spray Commonly known as: FLONASE Place 2 sprays into both nostrils daily.   Gemtesa 75 MG Tabs Generic drug: Vibegron Take 75 mg by mouth daily.   HYDROcodone-acetaminophen 5-325 MG tablet Commonly known as: NORCO/VICODIN Take 1-2 tablets by mouth every 6 (six) hours as needed for severe pain.   methocarbamol 500 MG tablet Commonly known as: ROBAXIN Take 1 tablet (500 mg total) by mouth every 6 (six) hours as needed for muscle spasms.   multivitamin with minerals Tabs tablet Take 1 tablet by mouth daily.   Prednisol Ace-Moxiflox-Bromfen 1-0.5-0.075 % Susp Place 1 drop  into the left eye 4 (four) times daily.   SYSTANE COMPLETE OP Place 1 drop into the right eye 2 (two) times daily as needed (dry eyes).   traMADol 50 MG tablet Commonly known as: ULTRAM Take 1-2 tablets (50-100 mg total) by mouth every 6 (six) hours as needed for moderate pain.   triamcinolone 55 MCG/ACT Aero nasal inhaler Commonly known as: NASACORT Place 2 sprays into the nose daily.               Discharge Care Instructions  (From admission, onward)           Start     Ordered   05/21/21 0000  Weight bearing as tolerated        05/21/21 0737   05/21/21 0000  Change dressing       Comments: You may remove the bulky bandage (ACE wrap and gauze) two days after surgery. You will have an adhesive waterproof bandage underneath. Leave this in place until your first follow-up appointment.   05/21/21 [id number]            Follow-up Information     Gaynelle Arabian, MD. Go on 06/05/2021.   Specialty: Orthopedic Surgery Why: You are scheduled for first post op appointment on Wednesday September 14th at 2:45pm. Contact information: [address] [place] [address] [id number]         Rosilyn Mings.. Go on 05/23/2021.   Why: You are scheduled for physical therapy evaluation on Thursday September 1st at 10:30am. Contact information: [place] [address] [phone]                 Signed: Theresa Duty, PA-C Orthopedic Surgery 05/29/2021, 8:58 AM

## 2021-05-31 DIAGNOSIS — M25561 Pain in right knee: Secondary | ICD-10-CM | POA: Diagnosis not present

## 2021-06-05 DIAGNOSIS — M25561 Pain in right knee: Secondary | ICD-10-CM | POA: Diagnosis not present

## 2021-06-07 DIAGNOSIS — M25561 Pain in right knee: Secondary | ICD-10-CM | POA: Diagnosis not present

## 2021-06-10 DIAGNOSIS — M25561 Pain in right knee: Secondary | ICD-10-CM | POA: Diagnosis not present

## 2021-06-17 DIAGNOSIS — M25561 Pain in right knee: Secondary | ICD-10-CM | POA: Diagnosis not present

## 2021-06-21 DIAGNOSIS — M25561 Pain in right knee: Secondary | ICD-10-CM | POA: Diagnosis not present

## 2021-06-25 DIAGNOSIS — Z471 Aftercare following joint replacement surgery: Secondary | ICD-10-CM | POA: Diagnosis not present

## 2021-06-25 DIAGNOSIS — Z96651 Presence of right artificial knee joint: Secondary | ICD-10-CM | POA: Diagnosis not present

## 2021-06-28 DIAGNOSIS — M25561 Pain in right knee: Secondary | ICD-10-CM | POA: Diagnosis not present

## 2021-07-01 DIAGNOSIS — M25561 Pain in right knee: Secondary | ICD-10-CM | POA: Diagnosis not present

## 2021-07-10 DIAGNOSIS — M25561 Pain in right knee: Secondary | ICD-10-CM | POA: Diagnosis not present

## 2021-07-15 DIAGNOSIS — M25561 Pain in right knee: Secondary | ICD-10-CM | POA: Diagnosis not present

## 2021-07-19 DIAGNOSIS — M25561 Pain in right knee: Secondary | ICD-10-CM | POA: Diagnosis not present

## 2021-07-23 DIAGNOSIS — M25561 Pain in right knee: Secondary | ICD-10-CM | POA: Diagnosis not present

## 2021-07-26 DIAGNOSIS — M25561 Pain in right knee: Secondary | ICD-10-CM | POA: Diagnosis not present

## 2021-08-07 DIAGNOSIS — M25561 Pain in right knee: Secondary | ICD-10-CM | POA: Diagnosis not present

## 2021-08-13 DIAGNOSIS — M25561 Pain in right knee: Secondary | ICD-10-CM | POA: Diagnosis not present

## 2021-08-19 ENCOUNTER — Encounter: Payer: Self-pay | Admitting: Internal Medicine

## 2021-08-19 DIAGNOSIS — Z1231 Encounter for screening mammogram for malignant neoplasm of breast: Secondary | ICD-10-CM | POA: Diagnosis not present

## 2021-09-13 DIAGNOSIS — Z961 Presence of intraocular lens: Secondary | ICD-10-CM | POA: Diagnosis not present

## 2021-09-13 DIAGNOSIS — H43813 Vitreous degeneration, bilateral: Secondary | ICD-10-CM | POA: Diagnosis not present

## 2021-09-13 DIAGNOSIS — H532 Diplopia: Secondary | ICD-10-CM | POA: Diagnosis not present

## 2021-09-13 DIAGNOSIS — H04123 Dry eye syndrome of bilateral lacrimal glands: Secondary | ICD-10-CM | POA: Diagnosis not present

## 2021-09-13 DIAGNOSIS — H40013 Open angle with borderline findings, low risk, bilateral: Secondary | ICD-10-CM | POA: Diagnosis not present

## 2021-09-13 DIAGNOSIS — H353131 Nonexudative age-related macular degeneration, bilateral, early dry stage: Secondary | ICD-10-CM | POA: Diagnosis not present

## 2021-09-30 DIAGNOSIS — H903 Sensorineural hearing loss, bilateral: Secondary | ICD-10-CM | POA: Diagnosis not present

## 2021-11-06 ENCOUNTER — Other Ambulatory Visit: Payer: Self-pay | Admitting: Internal Medicine

## 2021-11-19 DIAGNOSIS — M25561 Pain in right knee: Secondary | ICD-10-CM | POA: Diagnosis not present

## 2021-12-04 ENCOUNTER — Encounter: Payer: Self-pay | Admitting: Internal Medicine

## 2021-12-04 ENCOUNTER — Ambulatory Visit (INDEPENDENT_AMBULATORY_CARE_PROVIDER_SITE_OTHER): Payer: Medicare PPO | Admitting: Internal Medicine

## 2021-12-04 ENCOUNTER — Other Ambulatory Visit: Payer: Self-pay

## 2021-12-04 DIAGNOSIS — G8929 Other chronic pain: Secondary | ICD-10-CM | POA: Diagnosis not present

## 2021-12-04 DIAGNOSIS — N3946 Mixed incontinence: Secondary | ICD-10-CM | POA: Diagnosis not present

## 2021-12-04 DIAGNOSIS — K529 Noninfective gastroenteritis and colitis, unspecified: Secondary | ICD-10-CM | POA: Diagnosis not present

## 2021-12-04 DIAGNOSIS — M25512 Pain in left shoulder: Secondary | ICD-10-CM

## 2021-12-04 DIAGNOSIS — M25511 Pain in right shoulder: Secondary | ICD-10-CM

## 2021-12-04 MED ORDER — GEMTESA 75 MG PO TABS: 1.0000 | ORAL_TABLET | Freq: Every day | ORAL | 1 refills | Status: DC

## 2021-12-04 MED ORDER — CHOLESTYRAMINE 4 G PO PACK: PACK | ORAL | 3 refills | Status: DC

## 2021-12-04 NOTE — Assessment & Plan Note (Signed)
She is having some ongoing pain in the back which is now slightly lower in the thoracic region. She is taking tylenol in the evening and advised to take 1000 mg tylenol otc in the evening to help prevent awakenings with pain. Also advised to try magnesium otc in the evenings as she is having more muscular type pain. She will let us know if this is not helping. She did decline labs today we considered checking CMP and magnesium level and CK level.  ?

## 2021-12-04 NOTE — Progress Notes (Signed)
? ?  Subjective:  ? ?Patient ID: Angela Barber, female    DOB: 02-06-1936, 86 y.o.   MRN: [id number] ? ?HPI ?The patient is an 86 YO female coming in for follow up/concerns. ? ?Review of Systems  ?Constitutional: Negative.   ?HENT: Negative.    ?Eyes: Negative.   ?Respiratory:  Negative for cough, chest tightness and shortness of breath.   ?Cardiovascular:  Negative for chest pain, palpitations and leg swelling.  ?Gastrointestinal:  Negative for abdominal distention, abdominal pain, constipation, diarrhea, nausea and vomiting.  ?Musculoskeletal:  Positive for back pain and myalgias.  ?Skin: Negative.   ?Neurological: Negative.   ?Psychiatric/Behavioral: Negative.    ? ?Objective:  ?Physical Exam ?Constitutional:   ?   Appearance: She is well-developed.  ?HENT:  ?   Head: Normocephalic and atraumatic.  ?Cardiovascular:  ?   Rate and Rhythm: Normal rate and regular rhythm.  ?Pulmonary:  ?   Effort: Pulmonary effort is normal. No respiratory distress.  ?   Breath sounds: Normal breath sounds. No wheezing or rales.  ?Abdominal:  ?   General: Bowel sounds are normal. There is no distension.  ?   Palpations: Abdomen is soft.  ?   Tenderness: There is no abdominal tenderness. There is no rebound.  ?Musculoskeletal:     ?   General: Tenderness present.  ?   Cervical back: Normal range of motion.  ?   Comments: Tenderness paraspinal thoracic region without spinal tenderness  ?Skin: ?   General: Skin is warm and dry.  ?Neurological:  ?   Mental Status: She is alert and oriented to person, place, and time.  ?   Coordination: Coordination normal.  ? ? ?Vitals:  ? 12/04/21 1030  ?BP: 126/70  ?Pulse: 83  ?Resp: 18  ?SpO2: 97%  ?Weight: 145 lb (65.8 kg)  ?Height: '5\' 3"'$  (1.6 m)  ? ? ?This visit occurred during the SARS-CoV-2 public health emergency.  Safety protocols were in place, including screening questions prior to the visit, additional usage of staff PPE, and extensive cleaning of exam room while observing appropriate contact  time as indicated for disinfecting solutions.  ? ?Assessment & Plan:  ? ?

## 2021-12-04 NOTE — Patient Instructions (Addendum)
Try taking magnesium in the evening to help with the muscles. ? ?We will give you some exercises to help with the strengthening. ?

## 2021-12-04 NOTE — Assessment & Plan Note (Signed)
Gemtesa 75 mg daily is helping better with her incontinence and will refill this medication and continue daily.  ?

## 2021-12-04 NOTE — Assessment & Plan Note (Signed)
Needs refill on questran 4 g packet daily prn for diarrhea s/p cholecystectomy. Refill done today and reminded risk of constipation if this occurs cut down on dose or frequency.  ?

## 2022-01-07 ENCOUNTER — Encounter: Payer: Self-pay | Admitting: Internal Medicine

## 2022-01-07 ENCOUNTER — Ambulatory Visit: Payer: Medicare PPO | Admitting: Internal Medicine

## 2022-01-07 DIAGNOSIS — H8110 Benign paroxysmal vertigo, unspecified ear: Secondary | ICD-10-CM | POA: Diagnosis not present

## 2022-01-07 MED ORDER — MECLIZINE HCL 12.5 MG PO TABS: 12.5000 mg | ORAL_TABLET | Freq: Three times a day (TID) | ORAL | 0 refills | Status: DC | PRN

## 2022-01-07 NOTE — Progress Notes (Signed)
? ?  Subjective:  ? ?Patient ID: Angela Barber, female    DOB: 1935/10/26, 86 y.o.   MRN: [id number] ? ?HPI ?The patient is an 86 YO female coming in for concerns. ? ?Review of Systems  ?Constitutional: Negative.   ?HENT: Negative.    ?Eyes: Negative.   ?Respiratory:  Negative for cough, chest tightness and shortness of breath.   ?Cardiovascular:  Negative for chest pain, palpitations and leg swelling.  ?Gastrointestinal:  Negative for abdominal distention, abdominal pain, constipation, diarrhea, nausea and vomiting.  ?Musculoskeletal: Negative.   ?Skin: Negative.   ?Neurological:  Positive for dizziness.  ?Psychiatric/Behavioral: Negative.    ? ?Objective:  ?Physical Exam ?Constitutional:   ?   Appearance: She is well-developed.  ?HENT:  ?   Head: Normocephalic and atraumatic.  ?Cardiovascular:  ?   Rate and Rhythm: Normal rate and regular rhythm.  ?Pulmonary:  ?   Effort: Pulmonary effort is normal. No respiratory distress.  ?   Breath sounds: Normal breath sounds. No wheezing or rales.  ?Abdominal:  ?   General: Bowel sounds are normal. There is no distension.  ?   Palpations: Abdomen is soft.  ?   Tenderness: There is no abdominal tenderness. There is no rebound.  ?Musculoskeletal:  ?   Cervical back: Normal range of motion.  ?Skin: ?   General: Skin is warm and dry.  ?Neurological:  ?   Mental Status: She is alert and oriented to person, place, and time.  ?   Coordination: Coordination normal.  ? ? ?Vitals:  ? 01/07/22 0916  ?BP: 120/80  ?Pulse: 86  ?Resp: 18  ?SpO2: 91%  ?Weight: 147 lb 12.8 oz (67 kg)  ?Height: '5\' 3"'$  (1.6 m)  ? ? ?This visit occurred during the SARS-CoV-2 public health emergency.  Safety protocols were in place, including screening questions prior to the visit, additional usage of staff PPE, and extensive cleaning of exam room while observing appropriate contact time as indicated for disinfecting solutions.  ? ?Assessment & Plan:  ? ?

## 2022-01-07 NOTE — Patient Instructions (Signed)
We have sent in meclizine to use as needed for vertigo/dizziness up to 3 times a day. ? ?If you are not dizzy do not take this medicine. If you start taking it regularly and do not need it, this medicine can cause dizziness. ? ?Do the exercises 2-3 times a day for 1-2 weeks then I would recommend to keep doing 2-3 times per week lifelong to help avoid another episode. ?

## 2022-01-07 NOTE — Assessment & Plan Note (Signed)
Rx meclizine 12.5 mg TID prn for likely vertigo. Given epley maneuver so she can try. Symptoms not typical for stroke/brain mass. Ears clear on exam and no allergy symptoms. If no improvement can consider CT head. ?

## 2022-01-08 DIAGNOSIS — H903 Sensorineural hearing loss, bilateral: Secondary | ICD-10-CM | POA: Diagnosis not present

## 2022-01-09 DIAGNOSIS — D225 Melanocytic nevi of trunk: Secondary | ICD-10-CM | POA: Diagnosis not present

## 2022-01-09 DIAGNOSIS — L578 Other skin changes due to chronic exposure to nonionizing radiation: Secondary | ICD-10-CM | POA: Diagnosis not present

## 2022-01-09 DIAGNOSIS — D0410 Carcinoma in situ of skin of unspecified eyelid, including canthus: Secondary | ICD-10-CM | POA: Diagnosis not present

## 2022-01-09 DIAGNOSIS — L814 Other melanin hyperpigmentation: Secondary | ICD-10-CM | POA: Diagnosis not present

## 2022-01-09 DIAGNOSIS — L299 Pruritus, unspecified: Secondary | ICD-10-CM | POA: Diagnosis not present

## 2022-01-09 DIAGNOSIS — D485 Neoplasm of uncertain behavior of skin: Secondary | ICD-10-CM | POA: Diagnosis not present

## 2022-01-09 DIAGNOSIS — D224 Melanocytic nevi of scalp and neck: Secondary | ICD-10-CM | POA: Diagnosis not present

## 2022-01-09 DIAGNOSIS — L821 Other seborrheic keratosis: Secondary | ICD-10-CM | POA: Diagnosis not present

## 2022-01-09 DIAGNOSIS — L570 Actinic keratosis: Secondary | ICD-10-CM | POA: Diagnosis not present

## 2022-01-16 DIAGNOSIS — Z96651 Presence of right artificial knee joint: Secondary | ICD-10-CM | POA: Diagnosis not present

## 2022-01-21 DIAGNOSIS — Z45321 Encounter for adjustment and management of cochlear device: Secondary | ICD-10-CM | POA: Diagnosis not present

## 2022-01-21 DIAGNOSIS — H903 Sensorineural hearing loss, bilateral: Secondary | ICD-10-CM | POA: Diagnosis not present

## 2022-02-04 DIAGNOSIS — M19112 Post-traumatic osteoarthritis, left shoulder: Secondary | ICD-10-CM | POA: Diagnosis not present

## 2022-02-04 DIAGNOSIS — M25512 Pain in left shoulder: Secondary | ICD-10-CM | POA: Diagnosis not present

## 2022-02-05 ENCOUNTER — Telehealth: Payer: Self-pay | Admitting: Internal Medicine

## 2022-02-05 NOTE — Telephone Encounter (Signed)
LVM for pt to rtn my call to schedule AWV with [facility]. Please schedule AWV with [facility] if pt calls the office.   ?

## 2022-02-06 ENCOUNTER — Telehealth: Payer: Self-pay | Admitting: Internal Medicine

## 2022-02-06 NOTE — Telephone Encounter (Signed)
LVM for pt to rtn my call to schedule AWV with [facility]. Call back # [phone] 

## 2022-02-10 ENCOUNTER — Telehealth: Payer: Self-pay | Admitting: Internal Medicine

## 2022-02-10 NOTE — Telephone Encounter (Signed)
ERROR

## 2022-02-13 ENCOUNTER — Telehealth: Payer: Self-pay

## 2022-02-13 ENCOUNTER — Ambulatory Visit (INDEPENDENT_AMBULATORY_CARE_PROVIDER_SITE_OTHER): Payer: Medicare PPO

## 2022-02-13 VITALS — BP 120/80 | HR 84 | Temp 97.7°F | Ht 63.0 in | Wt 148.2 lb

## 2022-02-13 DIAGNOSIS — Z0000 Encounter for general adult medical examination without abnormal findings: Secondary | ICD-10-CM

## 2022-02-13 NOTE — Telephone Encounter (Signed)
Patient would like to know if Dr. Sharlet Salina has received a medical clearance from [facility] for surgery.  If not please contact Caryl Pina at [phone] to request.  They are scheduling into July 2023.

## 2022-02-13 NOTE — Progress Notes (Addendum)
Subjective:   Angela Barber is a 86 y.o. female who presents for Medicare Annual (Subsequent) preventive examination.  Review of Systems     Cardiac Risk Factors include: advanced age (>59mn, >>24women);family history of premature cardiovascular disease     Objective:    Today's Vitals   02/13/22 1459  BP: 120/80  Pulse: 84  Temp: 97.7 F (36.5 C)  SpO2: 96%  Weight: 148 lb 3.2 oz (67.2 kg)  Height: '5\' 3"'$  (1.6 m)  PainSc: 0-No pain   Body mass index is 26.25 kg/m.     02/13/2022    3:51 PM 05/20/2021    6:00 PM 02/11/2021    9:55 AM 10/29/2018    3:21 PM 10/28/2017    2:35 PM 07/31/2016    9:26 AM 07/24/2016    6:07 PM  Advanced Directives  Does Patient Have a Medical Advance Directive? Yes No Yes Yes No;Yes No No  Type of Advance Directive Living will;Healthcare Power of Attorney  Living will;[place]    Does patient want to make changes to medical advance directive? No - Patient declined  No - Patient declined      Copy of [place] Chart? No - copy requested  No - copy requested No - copy requested     Would patient like information on creating a medical advance directive?  No - Patient declined         Current Medications (verified) Outpatient Encounter Medications as of 02/13/2022  Medication Sig   acetaminophen (TYLENOL) 650 MG CR tablet Take 650 mg by mouth every 8 (eight) hours as needed for pain.   chlorhexidine (PERIDEX) 0.12 % solution 15 mLs by Mouth Rinse route daily.   cholestyramine (QUESTRAN) 4 g packet TAKE CONTENTS  OF 1  PACKET  (DISSOLVE AS DIRECTED)  BY MOUTH DAILY   diphenhydramine-acetaminophen (TYLENOL PM) 25-500 MG TABS tablet Take 1 tablet by mouth at bedtime as needed (sleep).   fluticasone (FLONASE) 50 MCG/ACT nasal spray Place 2 sprays into both nostrils daily.   meclizine (ANTIVERT) 12.5 MG tablet Take 1 tablet (12.5 mg  total) by mouth 3 (three) times daily as needed for dizziness.   Multiple Vitamin (MULTIVITAMIN WITH MINERALS) TABS tablet Take 1 tablet by mouth daily.   Prednisol Ace-Moxiflox-Bromfen 1-0.5-0.075 % SUSP Place 1 drop into the left eye 4 (four) times daily.   Propylene Glycol (SYSTANE COMPLETE OP) Place 1 drop into the right eye 2 (two) times daily as needed (dry eyes).   triamcinolone (NASACORT) 55 MCG/ACT AERO nasal inhaler Place 2 sprays into the nose daily.   Vibegron (GEMTESA) 75 MG TABS Take 1 tablet by mouth daily.   No facility-administered encounter medications on file as of 02/13/2022.    Allergies (verified) Other   History: Past Medical History:  Diagnosis Date   Anxiety    C. difficile diarrhea 05/23/2005   Cochlear implant status    Depression    Diverticulosis    GERD (gastroesophageal reflux disease)    Hearing loss of both ears    HLD (hyperlipidemia)    Hyperplastic colon polyp    Insomnia    Internal hemorrhoid    Osteoarthritis, knee    Past Surgical History:  Procedure Laterality Date   bilateral cataract surgery      BUNIONECTOMY     right   CHOLECYSTECTOMY N/A 07/25/2016   Procedure: LAPAROSCOPIC CHOLECYSTECTOMY;  Surgeon:  Jackolyn Confer, MD;  Location: [facility];  Service: General;  Laterality: N/A;   COCHLEAR IMPLANT  03/22/2012   right ear. Dr. Idelle Crouch   ERCP N/A 07/31/2016   Procedure: ENDOSCOPIC RETROGRADE CHOLANGIOPANCREATOGRAPHY (ERCP);  Surgeon: Milus Banister, MD;  Location: [facility];  Service: Endoscopy;  Laterality: N/A;   ROTATOR CUFF REPAIR Left 09/22/2010   ROTATOR CUFF REPAIR Right 2007 & 2009   TONSILLECTOMY     TOTAL KNEE ARTHROPLASTY Right 05/20/2021   Procedure: TOTAL KNEE ARTHROPLASTY;  Surgeon: Gaynelle Arabian, MD;  Location: [facility];  Service: Orthopedics;  Laterality: Right;   VAGINAL HYSTERECTOMY     Family History  Problem Relation Age of Onset   Ovarian cancer Mother    Transient ischemic attack Mother    Breast  cancer Maternal Aunt        aunts   Stroke Sister    Anxiety disorder Daughter    Autoimmune disease Daughter    Social History   Socioeconomic History   Marital status: Widowed    Spouse name: Not on file   Number of children: 2   Years of education: Master's   Highest education level: Not on file  Occupational History   Occupation: Product manager: RETIRED  Tobacco Use   Smoking status: Former   Smokeless tobacco: Never  Scientific laboratory technician Use: Never used  Substance and Sexual Activity   Alcohol use: Yes    Comment: rarely   Drug use: No   Sexual activity: Not Currently  Other Topics Concern   Not on file  Social History Narrative   Patient is widowed and lives alone.   Patient has two adult children.   Patient is retired.   Patient has a Scientist, water quality.   Patient is right-handed.   Patient drinks two cups of coffee daily.   Social Determinants of Health   Financial Resource Strain: Low Risk    Difficulty of Paying Living Expenses: Not hard at all  Food Insecurity: No Food Insecurity   Worried About Charity fundraiser in the Last Year: Never true   [place] in the Last Year: Never true  Transportation Needs: No Transportation Needs   Lack of Transportation (Medical): No   Lack of Transportation (Non-Medical): No  Physical Activity: Sufficiently Active   Days of Exercise per Week: 5 days   Minutes of Exercise per Session: 30 min  Stress: No Stress Concern Present   Feeling of Stress : Not at all  Social Connections: Moderately Integrated   Frequency of Communication with Friends and Family: More than three times a week   Frequency of Social Gatherings with Friends and Family: More than three times a week   Attends Religious Services: More than 4 times per year   Active Member of [employer] or Organizations: Yes   Attends Archivist Meetings: More than 4 times per year   Marital Status: Widowed    Tobacco Counseling Counseling given: Not  Answered   Clinical Intake:  Pre-visit preparation completed: Yes  Pain : No/denies pain Pain Score: 0-No pain     Nutritional Risks: None Diabetes: No  How often do you need to have someone help you when you read instructions, pamphlets, or other written materials from your doctor or pharmacy?: 1 - Never  Diabetic? no  Interpreter Needed?: No  Information entered by :: Lisette Abu, LPN.   Activities of Daily Living    02/13/2022  3:54 PM 05/20/2021    6:00 PM  In your present state of health, do you have any difficulty performing the following activities:  Hearing? 1 1  Vision? 0 0  Difficulty concentrating or making decisions? 0 0  Walking or climbing stairs? 0 1  Dressing or bathing? 0 0  Doing errands, shopping? 0 0  Preparing Food and eating ? N   Using the Toilet? N   In the past six months, have you accidently leaked urine? Y   Do you have problems with loss of bowel control? N   Managing your Medications? N   Managing your Finances? N   Housekeeping or managing your Housekeeping? N     Patient Care Team: Hoyt Koch, MD as PCP - General (Internal Medicine) [facility] as Consulting Physician (Ophthalmology) Warden Fillers, MD as Consulting Physician (Ophthalmology)  Indicate any recent Medical Services you may have received from other than [facility] providers in the past year (date may be approximate).     Assessment:   This is a routine wellness examination for Jaydy.  Hearing/Vision screen Hearing Screening - Comments:: Patient has difficulty hearing. Wears a cochlear implant . Vision Screening - Comments:: Patient wears readers for small print. Eye exam done by: Warden Fillers, MD.  Dietary issues and exercise activities discussed: Current Exercise Habits: Home exercise routine, Type of exercise: walking, Time (Minutes): 30, Frequency (Times/Week): 5, Weekly Exercise (Minutes/Week): 150, Intensity: Mild,  Exercise limited by: orthopedic condition(s)   Goals Addressed             This Visit's Progress    My goal is to maintain my health and keep on living.        Depression Screen    02/13/2022    3:54 PM 02/11/2021    9:51 AM 01/06/2020   12:30 PM 10/29/2018    1:42 PM 03/27/2016    2:12 PM  PHQ 2/9 Scores  PHQ - 2 Score 0 0 0 0 0    Fall Risk    02/13/2022    3:53 PM 02/11/2021    9:55 AM 01/06/2020   12:30 PM 01/06/2020    9:51 AM 10/29/2018    1:42 PM  Fall Risk   Falls in the past year? 0 0 0 0 0  Number falls in past yr: 0 0  0   Injury with Fall? 0 0  0   Risk for fall due to : No Fall Risks No Fall Risks     Follow up Falls evaluation completed Falls evaluation completed       [place]:  Any stairs in or around the home? No  If so, are there any without handrails? No  Home free of loose throw rugs in walkways, pet beds, electrical cords, etc? Yes  Adequate lighting in your home to reduce risk of falls? Yes   ASSISTIVE DEVICES UTILIZED TO PREVENT FALLS:  Life alert? No  Use of a cane, walker or w/c? No  Grab bars in the bathroom? Yes  Shower chair or bench in shower? No  Elevated toilet seat or a handicapped toilet? No   TIMED UP AND GO:  Was the test performed? No .  Length of time to ambulate 10 feet: n/a sec.   Appearance of gait: Patient not evaluated for gait during this visit.  Cognitive Function:    10/29/2018    2:33 PM  MMSE - Mini Mental State Exam  Orientation  to time 5  Orientation to Place 5  Registration 3  Attention/ Calculation 5  Recall 2  Language- name 2 objects 2  Language- repeat 1  Language- follow 3 step command 3  Language- read & follow direction 1  Write a sentence 1  Copy design 1  Total score 29        02/13/2022    3:55 PM  6CIT Screen  What Year? 0 points  What month? 0 points  What time? 0 points  Count back from 20 0 points  Months in reverse 0 points  Repeat phrase 0  points  Total Score 0 points    Immunizations Immunization History  Administered Date(s) Administered   Fluad Quad(high Dose 65+) 06/23/2019, 06/29/2020   H1N1 08/28/2008   Influenza Split 09/07/2012   Influenza Whole 09/04/2010   Influenza, High Dose Seasonal PF 08/15/2013, 06/19/2016, 10/06/2017, 08/30/2018   Influenza,inj,Quad PF,6+ Mos 08/10/2014   Influenza-Unspecified 06/23/2019   PFIZER(Purple Top)SARS-COV-2 Vaccination 10/27/2019, 11/22/2019, 07/17/2020, 01/16/2021   Pneumococcal Conjugate-13 08/10/2014   Pneumococcal Polysaccharide-23 05/24/2009, 02/19/2010   Td 05/24/2009, 02/19/2010   Zoster Recombinat (Shingrix) 08/31/2019, 02/20/2021   Zoster, Live 09/25/2014    TDAP status: Due, Education has been provided regarding the importance of this vaccine. Advised may receive this vaccine at local pharmacy or Health Dept. Aware to provide a copy of the vaccination record if obtained from local pharmacy or Health Dept. Verbalized acceptance and understanding.  Flu Vaccine status: Up to date  Pneumococcal vaccine status: Up to date  Covid-19 vaccine status: Completed vaccines  Qualifies for Shingles Vaccine? Yes   Zostavax completed Yes   Shingrix Completed?: Yes  Screening Tests Health Maintenance  Topic Date Due   TETANUS/TDAP  02/20/2020   COVID-19 Vaccine (5 - Booster for Pfizer series) 03/13/2021   INFLUENZA VACCINE  04/22/2022   MAMMOGRAM  08/19/2022   Pneumonia Vaccine 38+ Years old  Completed   DEXA SCAN  Completed   Zoster Vaccines- Shingrix  Completed   HPV VACCINES  Aged Out    Health Maintenance  Health Maintenance Due  Topic Date Due   TETANUS/TDAP  02/20/2020   COVID-19 Vaccine (5 - Booster for Pfizer series) 03/13/2021    Colorectal cancer screening: No longer required.   Mammogram status: Completed 08/19/2021. Repeat every year  Bone Density status: Completed 08/09/2020. Results reflect: Bone density results: OSTEOPOROSIS. Repeat every 2  years.  Lung Cancer Screening: (Low Dose CT Chest recommended if Age 34-80 years, 30 pack-year currently smoking OR have quit w/in 15years.) does not qualify.   Lung Cancer Screening Referral: no  Additional Screening:  Hepatitis C Screening: does not qualify; Completed no  Vision Screening: Recommended annual ophthalmology exams for early detection of glaucoma and other disorders of the eye. Is the patient up to date with their annual eye exam?  Yes  Who is the provider or what is the name of the office in which the patient attends annual eye exams? Warden Fillers, MD. If pt is not established with a provider, would they like to be referred to a provider to establish care? No .   Dental Screening: Recommended annual dental exams for proper oral hygiene  Community Resource Referral / Chronic Care Management: CRR required this visit?  No   CCM required this visit?  No      Plan:     I have personally reviewed and noted the following in the patient's chart:   Medical and social history Use of alcohol, tobacco  or illicit drugs  Current medications and supplements including opioid prescriptions.  Functional ability and status Nutritional status Physical activity Advanced directives List of other physicians Hospitalizations, surgeries, and ER visits in previous 12 months Vitals Screenings to include cognitive, depression, and falls Referrals and appointments  In addition, I have reviewed and discussed with patient certain preventive protocols, quality metrics, and best practice recommendations. A written personalized care plan for preventive services as well as general preventive health recommendations were provided to patient.     Sheral Flow, LPN   [id number]   Nurse Notes:  Hearing Screening - Comments:: Patient has difficulty hearing. Wears a cochlear implant . Vision Screening - Comments:: Patient wears readers for small print. Eye exam done by:  Warden Fillers, MD.  Medical screening examination/treatment/procedure(s) were performed by non-physician practitioner and as supervising physician I was immediately available for consultation/collaboration.  I agree with above. Lew Dawes, MD

## 2022-02-13 NOTE — Patient Instructions (Signed)
Angela Barber , Thank you for taking time to come for your Medicare Wellness Visit. I appreciate your ongoing commitment to your health goals. Please review the following plan we discussed and let me know if I can assist you in the future.   Screening recommendations/referrals: Colonoscopy: Not a candidate for screening due to age Mammogram: 08/19/2021; due every year Bone Density: 08/09/2020; due every 2 years Recommended yearly ophthalmology/optometry visit for glaucoma screening and checkup Recommended yearly dental visit for hygiene and checkup  Vaccinations: Influenza vaccine: 06/2021 Pneumococcal vaccine: 02/19/2010, 08/10/2014 Tdap vaccine: 02/19/2010; due every 10 years Shingles vaccine: 08/31/2019, 02/20/2021   Covid-19: 10/27/2019, 11/22/2019, 07/17/2020, 01/16/2021  Advanced directives: Yes  Conditions/risks identified: Yes  Next appointment: Please schedule your next Medicare Wellness Visit with your Nurse Health Advisor in 1 year by calling [phone].   Preventive Care 84 Years and Older, Female Preventive care refers to lifestyle choices and visits with your health care provider that can promote health and wellness. What does preventive care include? A yearly physical exam. This is also called an annual well check. Dental exams once or twice a year. Routine eye exams. Ask your health care provider how often you should have your eyes checked. Personal lifestyle choices, including: Daily care of your teeth and gums. Regular physical activity. Eating a healthy diet. Avoiding tobacco and drug use. Limiting alcohol use. Practicing safe sex. Taking low-dose aspirin every day. Taking vitamin and mineral supplements as recommended by your health care provider. What happens during an annual well check? The services and screenings done by your health care provider during your annual well check will depend on your age, overall health, lifestyle risk factors, and family history of  disease. Counseling  Your health care provider may ask you questions about your: Alcohol use. Tobacco use. Drug use. Emotional well-being. Home and relationship well-being. Sexual activity. Eating habits. History of falls. Memory and ability to understand (cognition). Work and work Statistician. Reproductive health. Screening  You may have the following tests or measurements: Height, weight, and BMI. Blood pressure. Lipid and cholesterol levels. These may be checked every 5 years, or more frequently if you are over 37 years old. Skin check. Lung cancer screening. You may have this screening every year starting at age 25 if you have a 30-pack-year history of smoking and currently smoke or have quit within the past 15 years. Fecal occult blood test (FOBT) of the stool. You may have this test every year starting at age 52. Flexible sigmoidoscopy or colonoscopy. You may have a sigmoidoscopy every 5 years or a colonoscopy every 10 years starting at age 37. Hepatitis C blood test. Hepatitis B blood test. Sexually transmitted disease (STD) testing. Diabetes screening. This is done by checking your blood sugar (glucose) after you have not eaten for a while (fasting). You may have this done every 1-3 years. Bone density scan. This is done to screen for osteoporosis. You may have this done starting at age 25. Mammogram. This may be done every 1-2 years. Talk to your health care provider about how often you should have regular mammograms. Talk with your health care provider about your test results, treatment options, and if necessary, the need for more tests. Vaccines  Your health care provider may recommend certain vaccines, such as: Influenza vaccine. This is recommended every year. Tetanus, diphtheria, and acellular pertussis (Tdap, Td) vaccine. You may need a Td booster every 10 years. Zoster vaccine. You may need this after age 30. Pneumococcal 13-valent conjugate (PCV13)  vaccine. One  dose is recommended after age 34. Pneumococcal polysaccharide (PPSV23) vaccine. One dose is recommended after age 43. Talk to your health care provider about which screenings and vaccines you need and how often you need them. This information is not intended to replace advice given to you by your health care provider. Make sure you discuss any questions you have with your health care provider. Document Released: 10/05/2015 Document Revised: 05/28/2016 Document Reviewed: 07/10/2015 Elsevier Interactive Patient Education  2017 [place] Prevention in the Home Falls can cause injuries. They can happen to people of all ages. There are many things you can do to make your home safe and to help prevent falls. What can I do on the outside of my home? Regularly fix the edges of walkways and driveways and fix any cracks. Remove anything that might make you trip as you walk through a door, such as a raised step or threshold. Trim any bushes or trees on the path to your home. Use bright outdoor lighting. Clear any walking paths of anything that might make someone trip, such as rocks or tools. Regularly check to see if handrails are loose or broken. Make sure that both sides of any steps have handrails. Any raised decks and porches should have guardrails on the edges. Have any leaves, snow, or ice cleared regularly. Use sand or salt on walking paths during winter. Clean up any spills in your garage right away. This includes oil or grease spills. What can I do in the bathroom? Use night lights. Install grab bars by the toilet and in the tub and shower. Do not use towel bars as grab bars. Use non-skid mats or decals in the tub or shower. If you need to sit down in the shower, use a plastic, non-slip stool. Keep the floor dry. Clean up any water that spills on the floor as soon as it happens. Remove soap buildup in the tub or shower regularly. Attach bath mats securely with double-sided  non-slip rug tape. Do not have throw rugs and other things on the floor that can make you trip. What can I do in the bedroom? Use night lights. Make sure that you have a light by your bed that is easy to reach. Do not use any sheets or blankets that are too big for your bed. They should not hang down onto the floor. Have a firm chair that has side arms. You can use this for support while you get dressed. Do not have throw rugs and other things on the floor that can make you trip. What can I do in the kitchen? Clean up any spills right away. Avoid walking on wet floors. Keep items that you use a lot in easy-to-reach places. If you need to reach something above you, use a strong step stool that has a grab bar. Keep electrical cords out of the way. Do not use floor polish or wax that makes floors slippery. If you must use wax, use non-skid floor wax. Do not have throw rugs and other things on the floor that can make you trip. What can I do with my stairs? Do not leave any items on the stairs. Make sure that there are handrails on both sides of the stairs and use them. Fix handrails that are broken or loose. Make sure that handrails are as long as the stairways. Check any carpeting to make sure that it is firmly attached to the stairs. Fix any carpet that is loose  or worn. Avoid having throw rugs at the top or bottom of the stairs. If you do have throw rugs, attach them to the floor with carpet tape. Make sure that you have a light switch at the top of the stairs and the bottom of the stairs. If you do not have them, ask someone to add them for you. What else can I do to help prevent falls? Wear shoes that: Do not have high heels. Have rubber bottoms. Are comfortable and fit you well. Are closed at the toe. Do not wear sandals. If you use a stepladder: Make sure that it is fully opened. Do not climb a closed stepladder. Make sure that both sides of the stepladder are locked into place. Ask  someone to hold it for you, if possible. Clearly mark and make sure that you can see: Any grab bars or handrails. First and last steps. Where the edge of each step is. Use tools that help you move around (mobility aids) if they are needed. These include: Canes. Walkers. Scooters. Crutches. Turn on the lights when you go into a dark area. Replace any light bulbs as soon as they burn out. Set up your furniture so you have a clear path. Avoid moving your furniture around. If any of your floors are uneven, fix them. If there are any pets around you, be aware of where they are. Review your medicines with your doctor. Some medicines can make you feel dizzy. This can increase your chance of falling. Ask your doctor what other things that you can do to help prevent falls. This information is not intended to replace advice given to you by your health care provider. Make sure you discuss any questions you have with your health care provider. Document Released: 07/05/2009 Document Revised: 02/14/2016 Document Reviewed: 10/13/2014 Elsevier Interactive Patient Education  2017 [employer].

## 2022-02-14 NOTE — Telephone Encounter (Signed)
Form has been faxed. Confirmation fax was received.

## 2022-02-14 NOTE — Telephone Encounter (Signed)
Signed.

## 2022-02-24 DIAGNOSIS — M25512 Pain in left shoulder: Secondary | ICD-10-CM | POA: Diagnosis not present

## 2022-02-27 DIAGNOSIS — D0439 Carcinoma in situ of skin of other parts of face: Secondary | ICD-10-CM | POA: Diagnosis not present

## 2022-03-05 DIAGNOSIS — M25512 Pain in left shoulder: Secondary | ICD-10-CM | POA: Diagnosis not present

## 2022-03-05 DIAGNOSIS — D0430 Carcinoma in situ of skin of unspecified part of face: Secondary | ICD-10-CM | POA: Diagnosis not present

## 2022-03-05 DIAGNOSIS — L570 Actinic keratosis: Secondary | ICD-10-CM | POA: Diagnosis not present

## 2022-03-31 DIAGNOSIS — M25512 Pain in left shoulder: Secondary | ICD-10-CM | POA: Diagnosis not present

## 2022-05-12 DIAGNOSIS — M25812 Other specified joint disorders, left shoulder: Secondary | ICD-10-CM | POA: Diagnosis not present

## 2022-05-22 DIAGNOSIS — Z7189 Other specified counseling: Secondary | ICD-10-CM | POA: Diagnosis not present

## 2022-05-22 DIAGNOSIS — Z45321 Encounter for adjustment and management of cochlear device: Secondary | ICD-10-CM | POA: Diagnosis not present

## 2022-05-22 DIAGNOSIS — H903 Sensorineural hearing loss, bilateral: Secondary | ICD-10-CM | POA: Diagnosis not present

## 2022-05-22 DIAGNOSIS — Z974 Presence of external hearing-aid: Secondary | ICD-10-CM | POA: Diagnosis not present

## 2022-06-17 DIAGNOSIS — H04123 Dry eye syndrome of bilateral lacrimal glands: Secondary | ICD-10-CM | POA: Diagnosis not present

## 2022-06-17 DIAGNOSIS — H353131 Nonexudative age-related macular degeneration, bilateral, early dry stage: Secondary | ICD-10-CM | POA: Diagnosis not present

## 2022-06-17 DIAGNOSIS — H40013 Open angle with borderline findings, low risk, bilateral: Secondary | ICD-10-CM | POA: Diagnosis not present

## 2022-06-17 DIAGNOSIS — Z961 Presence of intraocular lens: Secondary | ICD-10-CM | POA: Diagnosis not present

## 2022-06-17 DIAGNOSIS — H532 Diplopia: Secondary | ICD-10-CM | POA: Diagnosis not present

## 2022-06-17 DIAGNOSIS — H43813 Vitreous degeneration, bilateral: Secondary | ICD-10-CM | POA: Diagnosis not present

## 2022-06-18 ENCOUNTER — Telehealth: Payer: Self-pay | Admitting: Internal Medicine

## 2022-06-18 NOTE — Telephone Encounter (Signed)
PT calls today in regards to last visit with their ophthalmologist. They are recommending PT get orders in to check their thyroid levels. I had let PT know that we would need these orders in before setting up a lab order for her. PT would like to be notified by text or email, if possible, once these orders are in.  FYI PT had stated too me that she does not do well talking over the phone and this is why she was requesting to be notified by text or by e-mail.   Text: [phone] Email: [email or web address]

## 2022-06-19 NOTE — Telephone Encounter (Signed)
Please advise 

## 2022-06-20 NOTE — Telephone Encounter (Signed)
We would need some kind of diagnosis code for why they are wanting thyroid tests ordered or reason if they don't have a code.

## 2022-06-23 ENCOUNTER — Encounter: Payer: Self-pay | Admitting: *Deleted

## 2022-06-23 NOTE — Telephone Encounter (Signed)
Sent MYCHART message

## 2022-06-24 DIAGNOSIS — H532 Diplopia: Secondary | ICD-10-CM | POA: Diagnosis not present

## 2022-06-25 DIAGNOSIS — H532 Diplopia: Secondary | ICD-10-CM | POA: Diagnosis not present

## 2022-07-18 ENCOUNTER — Ambulatory Visit (INDEPENDENT_AMBULATORY_CARE_PROVIDER_SITE_OTHER): Payer: Medicare PPO | Admitting: Internal Medicine

## 2022-07-18 ENCOUNTER — Encounter: Payer: Self-pay | Admitting: Internal Medicine

## 2022-07-18 DIAGNOSIS — H532 Diplopia: Secondary | ICD-10-CM | POA: Diagnosis not present

## 2022-07-18 NOTE — Patient Instructions (Signed)
The thyroid levels are normal.

## 2022-07-18 NOTE — Progress Notes (Signed)
   Subjective:   Patient ID: Angela Barber, female    DOB: 01/19/1936, 86 y.o.   MRN: [id number]  HPI The patient is an 86 YO female coming in for counseling about labs done with eye specialist for diplopia. Labs in media tab.  Review of Systems  Constitutional: Negative.   HENT:  Positive for hearing loss.   Eyes: Negative.        Diplopia  Respiratory:  Negative for cough, chest tightness and shortness of breath.   Cardiovascular:  Negative for chest pain, palpitations and leg swelling.  Gastrointestinal:  Negative for abdominal distention, abdominal pain, constipation, diarrhea, nausea and vomiting.  Musculoskeletal: Negative.   Skin: Negative.   Neurological: Negative.   Psychiatric/Behavioral: Negative.      Objective:  Physical Exam Constitutional:      Appearance: She is well-developed.  HENT:     Head: Normocephalic and atraumatic.  Cardiovascular:     Rate and Rhythm: Normal rate and regular rhythm.  Pulmonary:     Effort: Pulmonary effort is normal. No respiratory distress.     Breath sounds: Normal breath sounds. No wheezing or rales.  Abdominal:     General: Bowel sounds are normal. There is no distension.     Palpations: Abdomen is soft.     Tenderness: There is no abdominal tenderness. There is no rebound.  Musculoskeletal:     Cervical back: Normal range of motion.  Skin:    General: Skin is warm and dry.  Neurological:     Mental Status: She is alert and oriented to person, place, and time.     Coordination: Coordination normal.     Vitals:   07/18/22 1533  BP: 120/80  Pulse: 91  Temp: 98.1 F (36.7 C)  TempSrc: Oral  SpO2: 91%  Weight: 148 lb (67.1 kg)  Height: '5\' 3"'$  (1.6 m)    Assessment & Plan:  Visit time 15 minutes in face to face communication with patient and coordination of care, additional 5 minutes spent in record review, coordination or care, ordering tests, communicating/referring to other healthcare professionals, documenting in  medical records all on the same day of the visit for total time 20 minutes spent on the visit.

## 2022-07-18 NOTE — Assessment & Plan Note (Signed)
Undergoing assessment through eye specialist. They did send records of CT head and thyroid panel both of which were normal. We will try to get records to see what other testing/evaluation has been done.

## 2022-07-23 ENCOUNTER — Other Ambulatory Visit: Payer: Self-pay | Admitting: Internal Medicine

## 2022-07-25 DIAGNOSIS — M25812 Other specified joint disorders, left shoulder: Secondary | ICD-10-CM | POA: Diagnosis not present

## 2022-07-28 ENCOUNTER — Telehealth: Payer: Self-pay | Admitting: Internal Medicine

## 2022-07-28 NOTE — Telephone Encounter (Signed)
Received and placed inside dr crawford's office box

## 2022-07-28 NOTE — Telephone Encounter (Signed)
Received Preoperative Risk Assessment Forms were received through fax. Forms were printed out and placed in Dr. Nathanial Millman box at the front.

## 2022-07-29 ENCOUNTER — Telehealth: Payer: Self-pay

## 2022-07-29 NOTE — Telephone Encounter (Signed)
Called patient on both numbers on file and left a voicemail, Stating that we have her pre op paper work but needs to come in for a visit before completing.

## 2022-07-30 NOTE — Telephone Encounter (Signed)
Called patient and lvm

## 2022-08-05 ENCOUNTER — Encounter: Payer: Self-pay | Admitting: Internal Medicine

## 2022-08-05 ENCOUNTER — Ambulatory Visit: Payer: Medicare PPO | Admitting: Internal Medicine

## 2022-08-05 VITALS — BP 128/60 | HR 84 | Temp 97.8°F | Ht 63.0 in | Wt 151.4 lb

## 2022-08-05 DIAGNOSIS — R21 Rash and other nonspecific skin eruption: Secondary | ICD-10-CM | POA: Insufficient documentation

## 2022-08-05 DIAGNOSIS — Z01810 Encounter for preprocedural cardiovascular examination: Secondary | ICD-10-CM | POA: Diagnosis not present

## 2022-08-05 NOTE — Progress Notes (Signed)
   Subjective:   Patient ID: Angela Barber, female    DOB: 1936/04/24, 86 y.o.   MRN: [id number]  HPI The patient is an 86 YO female coming in for pre-op clearance. No chest pains. Also having rash on scalp.  Review of Systems  Constitutional: Negative.   HENT: Negative.    Eyes: Negative.   Respiratory:  Negative for cough, chest tightness and shortness of breath.   Cardiovascular:  Negative for chest pain, palpitations and leg swelling.  Gastrointestinal:  Negative for abdominal distention, abdominal pain, constipation, diarrhea, nausea and vomiting.  Musculoskeletal:  Positive for arthralgias.  Skin:  Positive for rash.  Neurological: Negative.   Psychiatric/Behavioral: Negative.      Objective:  Physical Exam Constitutional:      Appearance: She is well-developed.  HENT:     Head: Normocephalic and atraumatic.  Cardiovascular:     Rate and Rhythm: Normal rate and regular rhythm.  Pulmonary:     Effort: Pulmonary effort is normal. No respiratory distress.     Breath sounds: Normal breath sounds. No wheezing or rales.  Abdominal:     General: Bowel sounds are normal. There is no distension.     Palpations: Abdomen is soft.     Tenderness: There is no abdominal tenderness. There is no rebound.  Musculoskeletal:        General: Tenderness present.     Cervical back: Normal range of motion.  Skin:    General: Skin is warm and dry.  Neurological:     Mental Status: She is alert and oriented to person, place, and time.     Coordination: Coordination normal.     Vitals:   08/05/22 1003  BP: 128/60  Pulse: 84  Temp: 97.8 F (36.6 C)  TempSrc: Oral  SpO2: 99%  Weight: 151 lb 6 oz (68.7 kg)  Height: '5\' 3"'$  (1.6 m)   EKG: Rate 81, axis normal, interval normal, low voltage, sinus, no st or t wave changes, no significant change compared to prior 2022   Assessment & Plan:  Visit time 20 minutes in face to face communication with patient and coordination of care,  additional 10 minutes spent in record review, coordination or care, ordering tests, communicating/referring to other healthcare professionals, documenting in medical records all on the same day of the visit for total time 30 minutes spent on the visit.

## 2022-08-05 NOTE — Patient Instructions (Signed)
You can try coconut oil on the scalp once a week to see if that helps.  You are fine to do the surgery.

## 2022-08-05 NOTE — Assessment & Plan Note (Signed)
Rash on face could be photosensitive rash as it developed with 4 hour car ride. [place] can cause photosensitivity. If recurrence let us know and we can investigate further. Resolving and almost gone within 2-3 days without intervention.

## 2022-08-05 NOTE — Telephone Encounter (Signed)
Pt was seen today and pre-op form has been done and faxed back over

## 2022-08-05 NOTE — Assessment & Plan Note (Signed)
EKG done and no significant change from prior. No further cardiac testing done. METS acceptable without chest pains or SOB. BP at goal. Recent labs stable. Low risk to proceed without further testing and papers filled out and faxed back day of visit.

## 2022-08-25 DIAGNOSIS — Z1231 Encounter for screening mammogram for malignant neoplasm of breast: Secondary | ICD-10-CM | POA: Diagnosis not present

## 2022-08-25 LAB — HM MAMMOGRAPHY

## 2022-08-26 NOTE — Progress Notes (Signed)
[facility] 

## 2022-08-27 ENCOUNTER — Telehealth: Payer: Self-pay | Admitting: Internal Medicine

## 2022-08-27 DIAGNOSIS — M810 Age-related osteoporosis without current pathological fracture: Secondary | ICD-10-CM

## 2022-08-27 NOTE — Telephone Encounter (Signed)
Pt is requesting Dr. Sharlet Salina put in orders for a bone density test at [place].

## 2022-08-28 NOTE — Telephone Encounter (Signed)
Order placed

## 2022-08-28 NOTE — Telephone Encounter (Signed)
Called patient and left message for patient to inform her that her dexa scan order was placed.

## 2022-08-29 NOTE — Patient Instructions (Addendum)
DUE TO COVID-19 ONLY TWO VISITORS  (aged 84 and older)  ARE ALLOWED TO COME WITH YOU AND STAY IN THE WAITING ROOM ONLY DURING PRE OP AND PROCEDURE.   **NO VISITORS ARE ALLOWED IN THE SHORT STAY AREA OR RECOVERY ROOM!!**  IF YOU WILL BE ADMITTED INTO THE HOSPITAL YOU ARE ALLOWED ONLY FOUR SUPPORT PEOPLE DURING VISITATION HOURS ONLY (7 AM -8PM)   The support person(s) must pass our screening, gel in and out, and wear a mask at all times, including in the patient's room. Patients must also wear a mask when staff or their support person are in the room. Visitors GUEST BADGE MUST BE WORN VISIBLY  One adult visitor may remain with you overnight and MUST be in the room by 8 P.M.     Your procedure is scheduled on: 09/18/22   Report to [facility] Main Entrance    Report to admitting at   10:00 AM   Call this number if you have problems the morning of surgery [phone]   Do not eat food :After Midnight.   After Midnight you may have the following liquids until _9:30_____ AM/ DAY OF SURGERY  Water Black Coffee (sugar ok, NO MILK/CREAM OR CREAMERS)  Tea (sugar ok, NO MILK/CREAM OR CREAMERS) regular and decaf                             Plain Jell-O (NO RED)                                           Fruit ices (not with fruit pulp, NO RED)                                     Popsicles (NO RED)                                                                  Juice: apple, WHITE grape, WHITE cranberry Sports drinks like Gatorade (NO RED)     The day of surgery:  Drink ONE (1) Pre-Surgery Clear Ensure at 9:15 AM the morning of surgery. Drink in one sitting. Do not sip.  This drink was given to you during your hospital  pre-op appointment visit. Nothing else to drink after completing the  Pre-Surgery Clear Ensure at 9:30 AM          If you have questions, please contact your surgeon's office.       Oral Hygiene is also important to reduce your risk of infection.                                     Remember - BRUSH YOUR TEETH THE MORNING OF SURGERY WITH YOUR REGULAR TOOTHPASTE  DENTURES WILL BE REMOVED PRIOR TO SURGERY PLEASE DO NOT APPLY "Poly grip" OR ADHESIVES!!!    Take these medicines the morning of surgery with A SIP OF WATER: Use your inhaler and bring it with you  Cholestyramine- Questran for HLD                                                                                                                            Gemtesa- antisposmotic   Bring CPAP mask and tubing day of surgery.                              You may not have any metal on your body including hair pins, jewelry, and body piercing             Do not wear make-up, lotions, powders, perfumes/cologne, or deodorant  Do not wear nail polish including gel and S&S, artificial/acrylic nails, or any other type of covering on natural nails including finger and toenails. If you have artificial nails, gel coating, etc. that needs to be removed by a nail salon please have this removed prior to surgery or surgery may need to be canceled/ delayed if the surgeon/ anesthesia feels like they are unable to be safely monitored.   Do not shave  48 hours prior to surgery.     Do not bring valuables to the hospital. [place].   Contacts, glasses, or bridgework may not be worn into surgery.      Patients discharged on the day of surgery will not be allowed to drive home.  Someone NEEDS to stay with you for the first 24 hours after anesthesia.   Special Instructions: Bring a copy of your healthcare power of attorney and living will documents   the day of surgery if you haven't scanned them before.              Please read over the following fact sheets you were given: IF YOU HAVE QUESTIONS ABOUT YOUR PRE-OP INSTRUCTIONS PLEASE CALL  [place]- Preparing for Total Shoulder Arthroplasty    Before surgery, you can play an important role. Because skin is not sterile, your skin needs to be as free of germs as possible. You can reduce the number of germs on your skin by using the following products. Benzoyl Peroxide Gel Reduces the number of germs present on the skin Applied twice a day to shoulder area starting two days before surgery    ==================================================================  Please follow these instructions carefully:  BENZOYL PEROXIDE 5% GEL  Please do not use if you have an allergy to benzoyl peroxide.   If your skin becomes reddened/irritated stop using the benzoyl peroxide.  Starting two days before surgery, apply as follows: Apply benzoyl peroxide in the morning and at night. Apply after taking a shower. If you are not taking a shower clean entire shoulder front, back, and side along with the armpit with a clean wet washcloth.  Place a quarter-sized dollop on your shoulder and rub in thoroughly,  making sure to cover the front, back, and side of your shoulder, along with the armpit.   2 days before ____ AM   ____ PM              1 day before ____ AM   ____ PM                         Do this twice a day for two days.  (Last application is the night before surgery, AFTER using the CHG soap as described below).  Do NOT apply benzoyl peroxide gel on the day of surgery.     [place] - Preparing for Surgery Before surgery, you can play an important role.  Because skin is not sterile, your skin needs to be as free of germs as possible.  You can reduce the number of germs on your skin by washing with CHG (chlorahexidine gluconate) soap before surgery.  CHG is an antiseptic cleaner which kills germs and bonds with the skin to continue killing germs even after washing. Please DO NOT use if you have an allergy to CHG or antibacterial soaps.  If your skin becomes  reddened/irritated stop using the CHG and inform your nurse when you arrive at Short Stay. Do not shave (including legs and underarms) for at least 48 hours prior to the first CHG shower.   Please follow these instructions carefully:  1.  Shower with CHG Soap the night before surgery and the  morning of Surgery.  2.  If you choose to wash your hair, wash your hair first as usual with your  normal  shampoo.  3.  After you shampoo, rinse your hair and body thoroughly to remove the  shampoo.                            4.  Use CHG as you would any other liquid soap.  You can apply chg directly  to the skin and wash                       Gently with a scrungie or clean washcloth.  5.  Apply the CHG Soap to your body ONLY FROM THE NECK DOWN.   Do not use on face/ open                           Wound or open sores. Avoid contact with eyes, ears mouth and genitals (private parts).                       Wash face,  Genitals (private parts) with your normal soap.             6.  Wash thoroughly, paying special attention to the area where your surgery  will be performed.  7.  Thoroughly rinse your body with warm water from the neck down.  8.  DO NOT shower/wash with your normal soap after using and rinsing off  the CHG Soap.                9.  Pat yourself dry with a clean towel.            10.  Wear clean pajamas.            11.  Place clean sheets on your bed the night of your  first shower and do not  sleep with pets. Day of Surgery : Do not apply any lotions/deodorants the morning of surgery.  Please wear clean clothes to the hospital/surgery center.  FAILURE TO FOLLOW THESE INSTRUCTIONS MAY RESULT IN THE CANCELLATION OF YOUR SURGERY     ________________________________________________________________________  Incentive Spirometer  An incentive spirometer is a tool that can help keep your lungs clear and active. This tool measures how well you are filling your lungs with each breath. Taking long  deep breaths may help reverse or decrease the chance of developing breathing (pulmonary) problems (especially infection) following: A long period of time when you are unable to move or be active. BEFORE THE PROCEDURE  If the spirometer includes an indicator to show your best effort, your nurse or respiratory therapist will set it to a desired goal. If possible, sit up straight or lean slightly forward. Try not to slouch. Hold the incentive spirometer in an upright position. INSTRUCTIONS FOR USE  Sit on the edge of your bed if possible, or sit up as far as you can in bed or on a chair. Hold the incentive spirometer in an upright position. Breathe out normally. Place the mouthpiece in your mouth and seal your lips tightly around it. Breathe in slowly and as deeply as possible, raising the piston or the ball toward the top of the column. Hold your breath for 3-5 seconds or for as long as possible. Allow the piston or ball to fall to the bottom of the column. Remove the mouthpiece from your mouth and breathe out normally. Rest for a few seconds and repeat Steps 1 through 7 at least 10 times every 1-2 hours when you are awake. Take your time and take a few normal breaths between deep breaths. The spirometer may include an indicator to show your best effort. Use the indicator as a goal to work toward during each repetition. After each set of 10 deep breaths, practice coughing to be sure your lungs are clear. If you have an incision (the cut made at the time of surgery), support your incision when coughing by placing a pillow or rolled up towels firmly against it. Once you are able to get out of bed, walk around indoors and cough well. You may stop using the incentive spirometer when instructed by your caregiver.  RISKS AND COMPLICATIONS Take your time so you do not get dizzy or light-headed. If you are in pain, you may need to take or ask for pain medication before doing incentive spirometry. It is  harder to take a deep breath if you are having pain. AFTER USE Rest and breathe slowly and easily. It can be helpful to keep track of a log of your progress. Your caregiver can provide you with a simple table to help with this. If you are using the spirometer at home, follow these instructions: [place] IF:  You are having difficultly using the spirometer. You have trouble using the spirometer as often as instructed. Your pain medication is not giving enough relief while using the spirometer. You develop fever of 100.5 F (38.1 C) or higher. SEEK IMMEDIATE MEDICAL CARE IF:  You cough up bloody sputum that had not been present before. You develop fever of 102 F (38.9 C) or greater. You develop worsening pain at or near the incision site. MAKE SURE YOU:  Understand these instructions. Will watch your condition. Will get help right away if you are not doing well or get worse. Document Released:  01/19/2007 Document Revised: 12/01/2011 Document Reviewed: 03/22/2007 [employer] Patient Information 2014 [place], [place].   ________________________________________________________________________

## 2022-09-01 ENCOUNTER — Encounter (HOSPITAL_COMMUNITY): Payer: Self-pay

## 2022-09-01 ENCOUNTER — Other Ambulatory Visit: Payer: Self-pay

## 2022-09-01 ENCOUNTER — Encounter (HOSPITAL_COMMUNITY)
Admission: RE | Admit: 2022-09-01 | Discharge: 2022-09-01 | Disposition: A | Discharge status: Home / Self Care | Payer: Medicare PPO | Source: Ambulatory Visit | Attending: Orthopedic Surgery | Admitting: Orthopedic Surgery

## 2022-09-01 DIAGNOSIS — Z01812 Encounter for preprocedural laboratory examination: Secondary | ICD-10-CM | POA: Diagnosis not present

## 2022-09-01 DIAGNOSIS — Z01818 Encounter for other preprocedural examination: Secondary | ICD-10-CM

## 2022-09-01 HISTORY — DX: Family history of other specified conditions: Z84.89

## 2022-09-01 LAB — CBC
HCT: 46.6 % — ABNORMAL HIGH (ref 36.0–46.0)
Hemoglobin: 15.5 g/dL — ABNORMAL HIGH (ref 12.0–15.0)
MCH: 31.3 pg (ref 26.0–34.0)
MCHC: 33.3 g/dL (ref 30.0–36.0)
MCV: 94 fL (ref 80.0–100.0)
Platelets: 323 10*3/uL (ref 150–400)
RBC: 4.96 MIL/uL (ref 3.87–5.11)
RDW: 12.8 % (ref 11.5–15.5)
WBC: 8 10*3/uL (ref 4.0–10.5)
nRBC: 0 % (ref 0.0–0.2)

## 2022-09-01 LAB — SURGICAL PCR SCREEN
MRSA, PCR: NEGATIVE
Staphylococcus aureus: NEGATIVE

## 2022-09-01 LAB — BASIC METABOLIC PANEL
Anion gap: 8 (ref 5–15)
BUN: 17 mg/dL (ref 8–23)
CO2: 27 mmol/L (ref 22–32)
Calcium: 9.7 mg/dL (ref 8.9–10.3)
Chloride: 108 mmol/L (ref 98–111)
Creatinine, Ser: 0.7 mg/dL (ref 0.44–1.00)
GFR, Estimated: >60 mL/min (ref >60)
Glucose, Bld: 101 mg/dL — ABNORMAL HIGH (ref 70–99)
Potassium: 4.5 mmol/L (ref 3.5–5.1)
Sodium: 143 mmol/L (ref 135–145)

## 2022-09-01 NOTE — Progress Notes (Addendum)
PCP - Pricilla Holm, MD clearance on chart 08-05-22  Cardiologist - no  PPM/ICD -  Device Orders -  Rep Notified -   Chest x-ray -  EKG - 08-05-22 epic Stress Test - 2017 ECHO - 2017 Cardiac Cath -   Sleep Study -  CPAP -   Fasting Blood Sugar -  Checks Blood Sugar _____ times a day  Blood Thinner Instructions: Aspirin Instructions:  ERAS Protcol - PRE-SURGERY Ensure or G2-    COVID vaccine -x3 or 4  Activity--Able to complete ADL's without SOB or CP Anesthesia review:   Patient denies shortness of breath, fever, cough and chest pain at PAT appointment   All instructions explained to the patient, with a verbal understanding of the material. Patient agrees to go over the instructions while at home for a better understanding. Patient also instructed to self quarantine after being tested for COVID-19. The opportunity to ask questions was provided.

## 2022-09-17 ENCOUNTER — Encounter (HOSPITAL_COMMUNITY): Payer: Self-pay | Admitting: Certified Registered Nurse Anesthetist

## 2022-09-18 ENCOUNTER — Encounter (HOSPITAL_COMMUNITY): Admission: RE | Payer: Self-pay | Source: Home / Self Care

## 2022-09-18 ENCOUNTER — Ambulatory Visit (HOSPITAL_COMMUNITY): Admission: RE | Admit: 2022-09-18 | Payer: Medicare PPO | Source: Home / Self Care | Admitting: Orthopedic Surgery

## 2022-09-18 DIAGNOSIS — Z01818 Encounter for other preprocedural examination: Secondary | ICD-10-CM

## 2022-09-18 SURGERY — ARTHROPLASTY, SHOULDER, TOTAL, REVERSE
Anesthesia: General | Site: Shoulder | Laterality: Left

## 2022-10-19 NOTE — Progress Notes (Signed)
COVID Vaccine received:  []  No [x]  Yes Date of any COVID positive Test in last 90 days:  PCP - Pricilla Holm, MD  Clearance was given in 08-05-22 Epic note- surgery was rescheduled from 09-18-22 to 10-30-22. Cardiologist - none  Chest x-ray - 10-08-2018   Epic EKG - 08-05-2022     Epic  Stress Test - 2017 ECHO - 2017 Cardiac Cath -   PCR screen: [x]  Ordered & Completed                      []   No Order but Needs PROFEND                      []   N/A for this surgery  Surgery Plan:  [x]  Ambulatory                            []  Outpatient in bed                            []  Admit  Anesthesia:    [x]  General  []  Spinal                           []   Choice []   MAC  Pacemaker / ICD device [x]  No []  Yes        Device order form faxed [x]  No    []   Yes      Faxed to:  Spinal Cord Stimulator:[x]  No []  Yes      (Remind patient to bring remote DOS)  Other Implants: Patient has a COCHLEAR IMPLANT RIGHT EAR  History of Sleep Apnea? [x]  No []  Yes   Sleep study 05-22-2014 in Epic CPAP used?- [x]  No []  Yes    Does the patient monitor blood sugar? []  No []  Yes  [x]  N/A  Blood Thinner / Instructions: none Aspirin Instructions: none  ERAS Protocol Ordered: []  No  [x]  Yes PRE-SURGERY [x]  ENSURE  []  G2  Patient is to be NPO after: 09:30 am  Comments: This surgery was rescheduled from 09-18-22 to 10-30-2022.   Activity level: Patient can climb a flight of stairs without difficulty; [x]  No CP  [x]  No SOB. Patient can / can not perform ADLs without assistance.   Anesthesia review: HTN, Cochlear Implant- Right ear, "sister had cognitive decline after anesthesia"  Patient denies shortness of breath, fever, cough and chest pain at PAT appointment.  Patient verbalized understanding and agreement to the Pre-Surgical Instructions that were given to them at this PAT appointment. Patient was also educated of the need to review these PAT instructions again prior to his/her surgery.I  reviewed the appropriate phone numbers to call if they have any and questions or concerns.

## 2022-10-20 NOTE — Patient Instructions (Addendum)
SURGICAL WAITING ROOM VISITATION Patients having surgery or a procedure may have no more than 2 support people in the waiting area - these visitors may rotate in the visitor waiting room.   Due to an increase in RSV and influenza rates and associated hospitalizations, children ages 54 and under may not visit patients in [place]. If the patient needs to stay at the hospital during part of their recovery, the visitor guidelines for inpatient rooms apply.  PRE-OP VISITATION  Pre-op nurse will coordinate an appropriate time for 1 support person to accompany the patient in pre-op.  This support person may not rotate.  This visitor will be contacted when the time is appropriate for the visitor to come back in the pre-op area.  Please refer to the [facility] website for the visitor guidelines for Inpatients (after your surgery is over and you are in a regular room).  You are not required to quarantine at this time prior to your surgery. However, you must do this: Hand Hygiene often Do NOT share personal items Notify your provider if you are in close contact with someone who has COVID or you develop fever 100.4 or greater, new onset of sneezing, cough, sore throat, shortness of breath or body aches.  If you test positive for Covid or have been in contact with anyone that has tested positive in the last 10 days please notify you surgeon.    Your procedure is scheduled on:  Thursday  October 30, 2022  Report to [facility] Main Entrance: [place] entrance where the [employer] is available.   Report to admitting at: 10:00 AM  +++++Call this number if you have any questions or problems the morning of surgery [phone]  Do not eat food after Midnight the night prior to your surgery/procedure.  After Midnight you may have the following liquids until  09:30 AM DAY OF SURGERY  Clear Liquid Diet Water Black Coffee (sugar ok, NO MILK/CREAM OR CREAMERS)  Tea (sugar ok, NO  MILK/CREAM OR CREAMERS) regular and decaf                             Plain Jell-O  with no fruit (NO RED)                                           Fruit ices (not with fruit pulp, NO RED)                                     Popsicles (NO RED)                                                                  Juice: apple, WHITE grape, WHITE cranberry Sports drinks like Gatorade or Powerade (NO RED)                    The day of surgery:  Drink ONE (1) Pre-Surgery Clear Ensure at   09:30 AM the morning of surgery. Drink in one sitting. Do not  sip.  This drink was given to you during your hospital pre-op appointment visit. Nothing else to drink after completing the Pre-Surgery Clear Ensure : No candy, chewing gum or throat lozenges.    FOLLOW  ANY ADDITIONAL PRE OP INSTRUCTIONS YOU RECEIVED FROM YOUR SURGEON'S OFFICE!!!   Oral Hygiene is also important to reduce your risk of infection.        Remember - BRUSH YOUR TEETH THE MORNING OF SURGERY WITH YOUR REGULAR TOOTHPASTE  Do NOT smoke after Midnight the night before surgery.  Take ONLY these medicines the morning of surgery with A SIP OF WATER:  none    You may take Tylenol if needed for pain, You may use your Flonase Nasal spray if needed.                   You may not have any metal on your body including hair pins, jewelry, and body piercing  Do not wear make-up, lotions, powders, perfumes or deodorant  Do not wear nail polish including gel and S&S, artificial / acrylic nails, or any other type of covering on natural nails including finger and toenails. If you have artificial nails, gel coating, etc., that needs to be removed by a nail salon, Please have this removed prior to surgery. Not doing so may mean that your surgery could be cancelled or delayed if the Surgeon or anesthesia staff feels like they are unable to monitor you safely.   Do not shave 48 hours prior to surgery to avoid nicks in your skin which may contribute to  postoperative infections.   Patients discharged on the day of surgery will not be allowed to drive home.  Someone NEEDS to stay with you for the first 24 hours after anesthesia.  Do not bring your home medications to the hospital. The Pharmacy will dispense medications listed on your medication list to you during your admission in the Hospital.  Special Instructions: Bring a copy of your healthcare power of attorney and living will documents the day of surgery, if you wish to have them scanned into your [facility] Records- EPIC  Please read over the following fact sheets you were given: IF YOU HAVE QUESTIONS ABOUT YOUR PRE-OP INSTRUCTIONS, PLEASE CALL [phone]  ([place])   [place] - Preparing for Surgery Before surgery, you can play an important role.  Because skin is not sterile, your skin needs to be as free of germs as possible.  You can reduce the number of germs on your skin by washing with CHG (chlorahexidine gluconate) soap before surgery.  CHG is an antiseptic cleaner which kills germs and bonds with the skin to continue killing germs even after washing. Please DO NOT use if you have an allergy to CHG or antibacterial soaps.  If your skin becomes reddened/irritated stop using the CHG and inform your nurse when you arrive at Short Stay. Do not shave (including legs and underarms) for at least 48 hours prior to the first CHG shower.  You may shave your face/neck.  Please follow these instructions carefully:  1.  Shower with CHG Soap the night before surgery and the  morning of surgery.  2.  If you choose to wash your hair, wash your hair first as usual with your normal  shampoo.  3.  After you shampoo, rinse your hair and body thoroughly to remove the shampoo.  4.  Use CHG as you would any other liquid soap.  You can apply chg directly to the skin and wash.  Gently with a scrungie or clean washcloth.  5.  Apply the CHG Soap to your body ONLY FROM THE  NECK DOWN.   Do not use on face/ open                           Wound or open sores. Avoid contact with eyes, ears mouth and genitals (private parts).                       Wash face,  Genitals (private parts) with your normal soap.             6.  Wash thoroughly, paying special attention to the area where your  surgery  will be performed.  7.  Thoroughly rinse your body with warm water from the neck down.  8.  DO NOT shower/wash with your normal soap after using and rinsing off the CHG Soap.            9.  Pat yourself dry with a clean towel.            10.  Wear clean pajamas.            11.  Place clean sheets on your bed the night of your first shower and do not  sleep with pets.  ON THE DAY OF SURGERY : Do not apply any lotions/deodorants the morning of surgery.  Please wear clean clothes to the hospital/surgery center.    FAILURE TO FOLLOW THESE INSTRUCTIONS MAY RESULT IN THE CANCELLATION OF YOUR SURGERY  PATIENT SIGNATURE_________________________________  NURSE SIGNATURE__________________________________  ________________________________________________________________________       Angela Barber    An incentive spirometer is a tool that can help keep your lungs clear and active. This tool measures how well you are filling your lungs with each breath. Taking long deep breaths may help reverse or decrease the chance of developing breathing (pulmonary) problems (especially infection) following: A long period of time when you are unable to move or be active. BEFORE THE PROCEDURE  If the spirometer includes an indicator to show your best effort, your nurse or respiratory therapist will set it to a desired goal. If possible, sit up straight or lean slightly forward. Try not to slouch. Hold the incentive spirometer in an upright position. INSTRUCTIONS FOR USE  Sit on the edge of your bed if possible, or sit up as far as you can in bed or on a chair. Hold the incentive  spirometer in an upright position. Breathe out normally. Place the mouthpiece in your mouth and seal your lips tightly around it. Breathe in slowly and as deeply as possible, raising the piston or the ball toward the top of the column. Hold your breath for 3-5 seconds or for as long as possible. Allow the piston or ball to fall to the bottom of the column. Remove the mouthpiece from your mouth and breathe out normally. Rest for a few seconds and repeat Steps 1 through 7 at least 10 times every 1-2 hours when you are awake. Take your time and take a few normal breaths between deep breaths. The spirometer may include an indicator to show your best effort. Use the indicator as a goal to work toward during each repetition. After each set of 10 deep breaths, practice coughing to be  sure your lungs are clear. If you have an incision (the cut made at the time of surgery), support your incision when coughing by placing a pillow or rolled up towels firmly against it. Once you are able to get out of bed, walk around indoors and cough well. You may stop using the incentive spirometer when instructed by your caregiver.  RISKS AND COMPLICATIONS Take your time so you do not get dizzy or light-headed. If you are in pain, you may need to take or ask for pain medication before doing incentive spirometry. It is harder to take a deep breath if you are having pain. AFTER USE Rest and breathe slowly and easily. It can be helpful to keep track of a log of your progress. Your caregiver can provide you with a simple table to help with this. If you are using the spirometer at home, follow these instructions: [place] IF:  You are having difficultly using the spirometer. You have trouble using the spirometer as often as instructed. Your pain medication is not giving enough relief while using the spirometer. You develop fever of 100.5 F (38.1 C) or higher.                                                                                                     SEEK IMMEDIATE MEDICAL CARE IF:  You cough up bloody sputum that had not been present before. You develop fever of 102 F (38.9 C) or greater. You develop worsening pain at or near the incision site. MAKE SURE YOU:  Understand these instructions. Will watch your condition. Will get help right away if you are not doing well or get worse. Document Released: 01/19/2007 Document Revised: 12/01/2011 Document Reviewed: 03/22/2007 [facility] Patient Information 2014 [place], [place].

## 2022-10-21 ENCOUNTER — Encounter (HOSPITAL_COMMUNITY)
Admission: RE | Admit: 2022-10-21 | Discharge: 2022-10-21 | Disposition: A | Discharge status: Home / Self Care | Payer: Medicare PPO | Source: Ambulatory Visit | Attending: Orthopedic Surgery | Admitting: Orthopedic Surgery

## 2022-10-21 ENCOUNTER — Other Ambulatory Visit: Payer: Self-pay

## 2022-10-21 ENCOUNTER — Encounter (HOSPITAL_COMMUNITY): Payer: Self-pay

## 2022-10-21 VITALS — BP 134/83 | HR 86 | Temp 97.7°F | Resp 14 | Ht 63.0 in | Wt 148.0 lb

## 2022-10-21 DIAGNOSIS — Z01812 Encounter for preprocedural laboratory examination: Secondary | ICD-10-CM | POA: Diagnosis not present

## 2022-10-21 DIAGNOSIS — Z01818 Encounter for other preprocedural examination: Secondary | ICD-10-CM

## 2022-10-21 DIAGNOSIS — I10 Essential (primary) hypertension: Secondary | ICD-10-CM

## 2022-10-21 HISTORY — DX: Essential (primary) hypertension: I10

## 2022-10-21 LAB — CBC
HCT: 44.6 % (ref 36.0–46.0)
Hemoglobin: 14.9 g/dL (ref 12.0–15.0)
MCH: 31.4 pg (ref 26.0–34.0)
MCHC: 33.4 g/dL (ref 30.0–36.0)
MCV: 93.9 fL (ref 80.0–100.0)
Platelets: 270 10*3/uL (ref 150–400)
RBC: 4.75 MIL/uL (ref 3.87–5.11)
RDW: 13.1 % (ref 11.5–15.5)
WBC: 8 10*3/uL (ref 4.0–10.5)
nRBC: 0 % (ref 0.0–0.2)

## 2022-10-21 LAB — BASIC METABOLIC PANEL
Anion gap: 10 (ref 5–15)
BUN: 23 mg/dL (ref 8–23)
CO2: 26 mmol/L (ref 22–32)
Calcium: 8.9 mg/dL (ref 8.9–10.3)
Chloride: 104 mmol/L (ref 98–111)
Creatinine, Ser: 0.75 mg/dL (ref 0.44–1.00)
GFR, Estimated: >60 mL/min (ref >60)
Glucose, Bld: 96 mg/dL (ref 70–99)
Potassium: 4.1 mmol/L (ref 3.5–5.1)
Sodium: 140 mmol/L (ref 135–145)

## 2022-10-21 LAB — SURGICAL PCR SCREEN
MRSA, PCR: NEGATIVE
Staphylococcus aureus: NEGATIVE

## 2022-10-30 ENCOUNTER — Ambulatory Visit (HOSPITAL_COMMUNITY): Payer: Medicare PPO | Admitting: Certified Registered Nurse Anesthetist

## 2022-10-30 ENCOUNTER — Ambulatory Visit (HOSPITAL_COMMUNITY)
Admission: RE | Admit: 2022-10-30 | Discharge: 2022-10-30 | Disposition: A | Discharge status: Home / Self Care | Payer: Medicare PPO | Attending: Orthopedic Surgery | Admitting: Orthopedic Surgery

## 2022-10-30 ENCOUNTER — Ambulatory Visit (HOSPITAL_BASED_OUTPATIENT_CLINIC_OR_DEPARTMENT_OTHER): Payer: Medicare PPO | Admitting: Certified Registered Nurse Anesthetist

## 2022-10-30 ENCOUNTER — Encounter (HOSPITAL_COMMUNITY)
Admission: RE | Disposition: A | Discharge status: Home / Self Care | Payer: Self-pay | Source: Home / Self Care | Attending: Orthopedic Surgery

## 2022-10-30 ENCOUNTER — Encounter (HOSPITAL_COMMUNITY): Payer: Self-pay | Admitting: Orthopedic Surgery

## 2022-10-30 DIAGNOSIS — M75102 Unspecified rotator cuff tear or rupture of left shoulder, not specified as traumatic: Secondary | ICD-10-CM

## 2022-10-30 DIAGNOSIS — Z87891 Personal history of nicotine dependence: Secondary | ICD-10-CM | POA: Diagnosis not present

## 2022-10-30 DIAGNOSIS — M12812 Other specific arthropathies, not elsewhere classified, left shoulder: Secondary | ICD-10-CM

## 2022-10-30 DIAGNOSIS — E785 Hyperlipidemia, unspecified: Secondary | ICD-10-CM | POA: Diagnosis not present

## 2022-10-30 DIAGNOSIS — I10 Essential (primary) hypertension: Secondary | ICD-10-CM | POA: Diagnosis not present

## 2022-10-30 DIAGNOSIS — F32A Depression, unspecified: Secondary | ICD-10-CM | POA: Diagnosis not present

## 2022-10-30 DIAGNOSIS — M19112 Post-traumatic osteoarthritis, left shoulder: Secondary | ICD-10-CM | POA: Diagnosis not present

## 2022-10-30 DIAGNOSIS — M19012 Primary osteoarthritis, left shoulder: Secondary | ICD-10-CM | POA: Insufficient documentation

## 2022-10-30 DIAGNOSIS — M25812 Other specified joint disorders, left shoulder: Secondary | ICD-10-CM | POA: Diagnosis not present

## 2022-10-30 DIAGNOSIS — K219 Gastro-esophageal reflux disease without esophagitis: Secondary | ICD-10-CM | POA: Insufficient documentation

## 2022-10-30 DIAGNOSIS — Z79899 Other long term (current) drug therapy: Secondary | ICD-10-CM | POA: Insufficient documentation

## 2022-10-30 DIAGNOSIS — F419 Anxiety disorder, unspecified: Secondary | ICD-10-CM | POA: Insufficient documentation

## 2022-10-30 DIAGNOSIS — G8918 Other acute postprocedural pain: Secondary | ICD-10-CM | POA: Diagnosis not present

## 2022-10-30 HISTORY — PX: REVERSE SHOULDER ARTHROPLASTY: SHX5054

## 2022-10-30 SURGERY — ARTHROPLASTY, SHOULDER, TOTAL, REVERSE
Anesthesia: General | Site: Shoulder | Laterality: Left

## 2022-10-30 MED ORDER — ROCURONIUM BROMIDE 10 MG/ML (PF) SYRINGE
PREFILLED_SYRINGE | INTRAVENOUS | Status: DC | PRN
  Administered 2022-10-30: 40 mg via INTRAVENOUS

## 2022-10-30 MED ORDER — LACTATED RINGERS IV SOLN: INTRAVENOUS | Status: DC

## 2022-10-30 MED ORDER — PHENYLEPHRINE HCL-NACL 20-0.9 MG/250ML-% IV SOLN
INTRAVENOUS | Status: DC | PRN
  Administered 2022-10-30: 35 ug/min via INTRAVENOUS

## 2022-10-30 MED ORDER — DEXAMETHASONE SODIUM PHOSPHATE 10 MG/ML IJ SOLN
INTRAMUSCULAR | Status: AC
  Filled 2022-10-30: qty 1

## 2022-10-30 MED ORDER — ROCURONIUM BROMIDE 10 MG/ML (PF) SYRINGE
PREFILLED_SYRINGE | INTRAVENOUS | Status: AC
  Filled 2022-10-30: qty 10

## 2022-10-30 MED ORDER — PROPOFOL 10 MG/ML IV BOLUS
INTRAVENOUS | Status: AC
  Filled 2022-10-30: qty 20

## 2022-10-30 MED ORDER — PROPOFOL 1000 MG/100ML IV EMUL
INTRAVENOUS | Status: AC
  Filled 2022-10-30: qty 100

## 2022-10-30 MED ORDER — HYDRALAZINE HCL 20 MG/ML IJ SOLN
INTRAMUSCULAR | Status: AC
  Filled 2022-10-30: qty 1

## 2022-10-30 MED ORDER — CHLORHEXIDINE GLUCONATE 0.12 % MT SOLN
15.0000 mL | Freq: Once | OROMUCOSAL | Status: AC
  Administered 2022-10-30: 15 mL via OROMUCOSAL

## 2022-10-30 MED ORDER — FENTANYL CITRATE (PF) 100 MCG/2ML IJ SOLN
INTRAMUSCULAR | Status: DC | PRN
  Administered 2022-10-30: 25 ug via INTRAVENOUS

## 2022-10-30 MED ORDER — ONDANSETRON HCL 4 MG/2ML IJ SOLN: 4.0000 mg | Freq: Four times a day (QID) | INTRAMUSCULAR | Status: DC | PRN

## 2022-10-30 MED ORDER — METOCLOPRAMIDE HCL 5 MG/ML IJ SOLN: 5.0000 mg | Freq: Three times a day (TID) | INTRAMUSCULAR | Status: DC | PRN

## 2022-10-30 MED ORDER — SUCCINYLCHOLINE CHLORIDE 200 MG/10ML IV SOSY
PREFILLED_SYRINGE | INTRAVENOUS | Status: DC | PRN
  Administered 2022-10-30: 100 mg via INTRAVENOUS

## 2022-10-30 MED ORDER — METOCLOPRAMIDE HCL 5 MG PO TABS: 5.0000 mg | ORAL_TABLET | Freq: Three times a day (TID) | ORAL | Status: DC | PRN

## 2022-10-30 MED ORDER — HYDROCODONE-ACETAMINOPHEN 5-325 MG PO TABS: 1.0000 | ORAL_TABLET | ORAL | 0 refills | Status: AC | PRN

## 2022-10-30 MED ORDER — TRANEXAMIC ACID-NACL 1000-0.7 MG/100ML-% IV SOLN
1000.0000 mg | INTRAVENOUS | Status: AC
  Administered 2022-10-30: 1000 mg via INTRAVENOUS
  Filled 2022-10-30: qty 100

## 2022-10-30 MED ORDER — ONDANSETRON HCL 4 MG PO TABS: 4.0000 mg | ORAL_TABLET | Freq: Four times a day (QID) | ORAL | Status: DC | PRN

## 2022-10-30 MED ORDER — ACETAMINOPHEN 10 MG/ML IV SOLN: 1000.0000 mg | Freq: Once | INTRAVENOUS | Status: DC | PRN

## 2022-10-30 MED ORDER — MELOXICAM 15 MG PO TABS: 15.0000 mg | ORAL_TABLET | Freq: Every day | ORAL | 2 refills | Status: AC

## 2022-10-30 MED ORDER — ONDANSETRON HCL 4 MG/2ML IJ SOLN
INTRAMUSCULAR | Status: AC
  Filled 2022-10-30: qty 2

## 2022-10-30 MED ORDER — DEXAMETHASONE SODIUM PHOSPHATE 10 MG/ML IJ SOLN
INTRAMUSCULAR | Status: DC | PRN
  Administered 2022-10-30: 10 mg via INTRAVENOUS

## 2022-10-30 MED ORDER — ORAL CARE MOUTH RINSE: 15.0000 mL | Freq: Once | OROMUCOSAL | Status: AC

## 2022-10-30 MED ORDER — 0.9 % SODIUM CHLORIDE (POUR BTL) OPTIME
TOPICAL | Status: DC | PRN
  Administered 2022-10-30: 1000 mL

## 2022-10-30 MED ORDER — ONDANSETRON HCL 4 MG/2ML IJ SOLN
INTRAMUSCULAR | Status: DC | PRN
  Administered 2022-10-30 (×2): 4 mg via INTRAVENOUS

## 2022-10-30 MED ORDER — ACETAMINOPHEN 160 MG/5ML PO SOLN: 1000.0000 mg | Freq: Once | ORAL | Status: DC | PRN

## 2022-10-30 MED ORDER — FENTANYL CITRATE (PF) 100 MCG/2ML IJ SOLN
INTRAMUSCULAR | Status: AC
  Filled 2022-10-30: qty 2

## 2022-10-30 MED ORDER — TRANEXAMIC ACID 1000 MG/10ML IV SOLN: 1000.0000 mg | INTRAVENOUS | Status: DC

## 2022-10-30 MED ORDER — ONDANSETRON HCL 4 MG PO TABS: 4.0000 mg | ORAL_TABLET | Freq: Three times a day (TID) | ORAL | 0 refills | Status: AC | PRN

## 2022-10-30 MED ORDER — FENTANYL CITRATE PF 50 MCG/ML IJ SOSY
50.0000 ug | PREFILLED_SYRINGE | INTRAMUSCULAR | Status: DC
  Administered 2022-10-30: 75 ug via INTRAVENOUS
  Filled 2022-10-30: qty 2

## 2022-10-30 MED ORDER — OXYCODONE HCL 5 MG/5ML PO SOLN: 5.0000 mg | Freq: Once | ORAL | Status: DC | PRN

## 2022-10-30 MED ORDER — PROPOFOL 10 MG/ML IV BOLUS
INTRAVENOUS | Status: DC | PRN
  Administered 2022-10-30: 70 mg via INTRAVENOUS

## 2022-10-30 MED ORDER — FENTANYL CITRATE PF 50 MCG/ML IJ SOSY: 25.0000 ug | PREFILLED_SYRINGE | INTRAMUSCULAR | Status: DC | PRN

## 2022-10-30 MED ORDER — CYCLOBENZAPRINE HCL 10 MG PO TABS: 10.0000 mg | ORAL_TABLET | Freq: Three times a day (TID) | ORAL | 1 refills | Status: AC | PRN

## 2022-10-30 MED ORDER — OXYCODONE HCL 5 MG PO TABS: 5.0000 mg | ORAL_TABLET | Freq: Once | ORAL | Status: DC | PRN

## 2022-10-30 MED ORDER — BUPIVACAINE-EPINEPHRINE (PF) 0.5% -1:200000 IJ SOLN
INTRAMUSCULAR | Status: DC | PRN
  Administered 2022-10-30: 15 mL via PERINEURAL

## 2022-10-30 MED ORDER — BUPIVACAINE LIPOSOME 1.3 % IJ SUSP
INTRAMUSCULAR | Status: DC | PRN
  Administered 2022-10-30: 133 mg via PERINEURAL

## 2022-10-30 MED ORDER — ACETAMINOPHEN 500 MG PO TABS: 1000.0000 mg | ORAL_TABLET | Freq: Once | ORAL | Status: DC | PRN

## 2022-10-30 MED ORDER — CEFAZOLIN SODIUM-DEXTROSE 2-4 GM/100ML-% IV SOLN
2.0000 g | INTRAVENOUS | Status: AC
  Administered 2022-10-30: 2 g via INTRAVENOUS
  Filled 2022-10-30: qty 100

## 2022-10-30 MED ORDER — PROPOFOL 500 MG/50ML IV EMUL
INTRAVENOUS | Status: DC | PRN
  Administered 2022-10-30: 100 ug/kg/min via INTRAVENOUS

## 2022-10-30 MED ORDER — VANCOMYCIN HCL 1000 MG IV SOLR
INTRAVENOUS | Status: DC | PRN
  Administered 2022-10-30: 1000 mg via TOPICAL

## 2022-10-30 MED ORDER — LACTATED RINGERS IV BOLUS
250.0000 mL | Freq: Once | INTRAVENOUS | Status: AC
  Administered 2022-10-30: 250 mL via INTRAVENOUS

## 2022-10-30 SURGICAL SUPPLY — 73 items
ADH SKN CLS APL DERMABOND .7 (GAUZE/BANDAGES/DRESSINGS) ×1
AID PSTN UNV HD RSTRNT DISP (MISCELLANEOUS) ×1
BAG COUNTER SPONGE SURGICOUNT (BAG) IMPLANT
BAG SPEC THK2 15X12 ZIP CLS (MISCELLANEOUS) ×1
BAG SPNG CNTER NS LX DISP (BAG) ×1
BAG ZIPLOCK 12X15 (MISCELLANEOUS) ×2 IMPLANT
BIT DRILL AR 3 (BIT) ×1
BIT DRILL AR 3 NS (BIT) IMPLANT
BLADE SAW SGTL 83.5X18.5 (BLADE) ×2 IMPLANT
BNDG CMPR 5X4 CHSV STRCH STRL (GAUZE/BANDAGES/DRESSINGS) ×1
BNDG COHESIVE 4X5 TAN STRL LF (GAUZE/BANDAGES/DRESSINGS) ×2 IMPLANT
BSPLAT GLND +2X24 MDLR (Joint) ×1 IMPLANT
COOLER ICEMAN CLASSIC (MISCELLANEOUS) ×2 IMPLANT
COVER BACK TABLE 60X90IN (DRAPES) ×2 IMPLANT
COVER SURGICAL LIGHT HANDLE (MISCELLANEOUS) ×2 IMPLANT
CUP SUT UNIV REVERS 36 NEUTRAL (Cup) IMPLANT
DERMABOND ADVANCED .7 DNX12 (GAUZE/BANDAGES/DRESSINGS) ×2 IMPLANT
DRAPE ORTHO SPLIT 77X108 STRL (DRAPES) ×2
DRAPE SHEET LG 3/4 BI-LAMINATE (DRAPES) ×2 IMPLANT
DRAPE SURG 17X11 SM STRL (DRAPES) ×2 IMPLANT
DRAPE SURG ORHT 6 SPLT 77X108 (DRAPES) ×4 IMPLANT
DRAPE TOP 10253 STERILE (DRAPES) ×2 IMPLANT
DRAPE U-SHAPE 47X51 STRL (DRAPES) ×2 IMPLANT
DRESSING AQUACEL AG SP 3.5X6 (GAUZE/BANDAGES/DRESSINGS) ×2 IMPLANT
DRILL SURG AR 10 (DRILL) IMPLANT
DRSG AQUACEL AG ADV 3.5X 6 (GAUZE/BANDAGES/DRESSINGS) IMPLANT
DRSG AQUACEL AG ADV 3.5X10 (GAUZE/BANDAGES/DRESSINGS) IMPLANT
DRSG AQUACEL AG SP 3.5X6 (GAUZE/BANDAGES/DRESSINGS) ×1
DURAPREP 26ML APPLICATOR (WOUND CARE) ×2 IMPLANT
ELECT BLADE TIP CTD 4 INCH (ELECTRODE) ×2 IMPLANT
ELECT PENCIL ROCKER SW 15FT (MISCELLANEOUS) ×2 IMPLANT
ELECT REM PT RETURN 15FT ADLT (MISCELLANEOUS) ×2 IMPLANT
FACESHIELD WRAPAROUND (MASK) ×5 IMPLANT
FACESHIELD WRAPAROUND OR TEAM (MASK) ×10 IMPLANT
GLENOID UNI REV MOD 24 +2 LAT (Joint) IMPLANT
GLENOSPHERE 36 +4 LAT/24 (Joint) IMPLANT
GLOVE BIO SURGEON STRL SZ7.5 (GLOVE) ×2 IMPLANT
GLOVE BIO SURGEON STRL SZ8 (GLOVE) ×2 IMPLANT
GLOVE SS BIOGEL STRL SZ 7 (GLOVE) ×2 IMPLANT
GLOVE SS BIOGEL STRL SZ 7.5 (GLOVE) ×2 IMPLANT
GOWN STRL SURGICAL XL XLNG (GOWN DISPOSABLE) ×4 IMPLANT
KIT BASIN OR (CUSTOM PROCEDURE TRAY) ×2 IMPLANT
KIT TURNOVER KIT A (KITS) IMPLANT
LINER HUMERAL 36 +3MM SM (Shoulder) IMPLANT
MANIFOLD NEPTUNE II (INSTRUMENTS) ×2 IMPLANT
NDL TAPERED W/ NITINOL LOOP (MISCELLANEOUS) ×2 IMPLANT
NEEDLE TAPERED W/ NITINOL LOOP (MISCELLANEOUS) ×1 IMPLANT
NS IRRIG 1000ML POUR BTL (IV SOLUTION) ×2 IMPLANT
PACK SHOULDER (CUSTOM PROCEDURE TRAY) ×2 IMPLANT
PAD ARMBOARD 7.5X6 YLW CONV (MISCELLANEOUS) ×2 IMPLANT
PAD COLD SHLDR WRAP-ON (PAD) ×2 IMPLANT
PIN NITINOL TARGETER 2.8 (PIN) IMPLANT
PIN SET MODULAR GLENOID SYSTEM (PIN) IMPLANT
RESTRAINT HEAD UNIVERSAL NS (MISCELLANEOUS) ×2 IMPLANT
SCREW CENTRAL MODULAR 25 (Screw) IMPLANT
SCREW PERI LOCK 5.5X16 (Screw) IMPLANT
SCREW PERI LOCK 5.5X32 (Screw) IMPLANT
SCREW PERIPHERAL 5.5X28 LOCK (Screw) IMPLANT
SLEEVE SCD COMPRESS KNEE MED (STOCKING) IMPLANT
SLING ARM FOAM STRAP LRG (SOFTGOODS) IMPLANT
SLING ARM FOAM STRAP MED (SOFTGOODS) IMPLANT
SPONGE T-LAP 18X18 ~~LOC~~+RFID (SPONGE) IMPLANT
STEM HUMERAL UNIVERS SZ8 (Stem) IMPLANT
SUT MNCRL AB 3-0 PS2 18 (SUTURE) ×2 IMPLANT
SUT MON AB 2-0 CT1 36 (SUTURE) ×2 IMPLANT
SUT VIC AB 1 CT1 36 (SUTURE) ×2 IMPLANT
SUTURE TAPE 1.3 40 TPR END (SUTURE) ×4 IMPLANT
SUTURETAPE 1.3 40 TPR END (SUTURE) ×2
TOWEL OR 17X26 10 PK STRL BLUE (TOWEL DISPOSABLE) ×2 IMPLANT
TOWEL OR NON WOVEN STRL DISP B (DISPOSABLE) ×2 IMPLANT
TUBE SUCTION HIGH CAP CLEAR NV (SUCTIONS) ×2 IMPLANT
TUBING CONNECTING 10 (TUBING) IMPLANT
WATER STERILE IRR 1000ML POUR (IV SOLUTION) ×4 IMPLANT

## 2022-10-30 NOTE — H&P (Signed)
[name]    Chief Complaint: Left shoulder rotator cuff tear arthropathy HPI: The patient is a 87 y.o. female with chronic and progressive increasing left shoulder pain related to severe rotator cuff tear arthropathy.  Due to her increasing functional limitations and failure to respond to prolonged attempts at conservative management, she is brought to the operating room at this time for planned left shoulder reverse arthroplasty  Past Medical History:  Diagnosis Date   Anxiety    C. difficile diarrhea 05/23/2005   Cochlear implant status    Depression    Diverticulosis    Family history of adverse reaction to anesthesia    sister had cognitive decline after surgery   GERD (gastroesophageal reflux disease)    occasional   Hearing loss of both ears    HLD (hyperlipidemia)    Hyperplastic colon polyp    Hypertension    Insomnia    Internal hemorrhoid    Osteoarthritis, knee       Past Surgical History:  Procedure Laterality Date   bilateral cataract surgery      with IOL   BUNIONECTOMY     right   CHOLECYSTECTOMY N/A 07/25/2016   Procedure: LAPAROSCOPIC CHOLECYSTECTOMY;  Surgeon: Jackolyn Confer, MD;  Location: [facility];  Service: General;  Laterality: N/A;   COCHLEAR IMPLANT  03/22/2012   right ear. Dr. Idelle Crouch   ERCP N/A 07/31/2016   Procedure: ENDOSCOPIC RETROGRADE CHOLANGIOPANCREATOGRAPHY (ERCP);  Surgeon: Milus Banister, MD;  Location: [facility];  Service: Endoscopy;  Laterality: N/A;   ROTATOR CUFF REPAIR Left 09/22/2010   ROTATOR CUFF REPAIR Right 2007 & 2009   TONSILLECTOMY     TOTAL KNEE ARTHROPLASTY Right 05/20/2021   Procedure: TOTAL KNEE ARTHROPLASTY;  Surgeon: Gaynelle Arabian, MD;  Location: [facility];  Service: Orthopedics;  Laterality: Right;   VAGINAL HYSTERECTOMY      Family History  Problem Relation Age of Onset   Ovarian cancer Mother    Transient ischemic attack Mother    Breast cancer Maternal Aunt        aunts   Stroke Sister    Anxiety  disorder Daughter    Autoimmune disease Daughter     Social History:  reports that she has quit smoking. Her smoking use included cigarettes. She has never used smokeless tobacco. She reports current alcohol use. She reports that she does not use drugs.  BMI: Estimated body mass index is 26.22 kg/m as calculated from the following:   Height as of 10/21/22: '5\' 3"'$  (1.6 m).   Weight as of 10/21/22: 67.1 kg.  Lab Results  Component Value Date   ALBUMIN 4.2 05/07/2021   Diabetes: Patient does not have a diagnosis of diabetes.     Smoking Status:       Medications Prior to Admission  Medication Sig Dispense Refill   acetaminophen (TYLENOL) 650 MG CR tablet Take 650 mg by mouth every 8 (eight) hours as needed for pain.     cholestyramine (QUESTRAN) 4 g packet TAKE CONTENTS  OF 1  PACKET  (DISSOLVE AS DIRECTED)  BY MOUTH DAILY (Patient taking differently: Take 4 g by mouth daily.) 90 packet 3   diphenhydramine-acetaminophen (TYLENOL PM) 25-500 MG TABS tablet Take 1 tablet by mouth at bedtime as needed (sleep).     fluticasone (FLONASE) 50 MCG/ACT nasal spray Place 2 sprays into both nostrils daily. (Patient taking differently: Place 2 sprays into both nostrils daily as needed for allergies.) 16 g 6   GEMTESA 75  MG TABS TAKE 1 TABLET EVERY DAY (Patient taking differently: Take 75 mg by mouth daily.) 90 tablet 10   Multiple Vitamin (MULTIVITAMIN WITH MINERALS) TABS tablet Take 2 tablets by mouth daily. Gummy Vit     Propylene Glycol (SYSTANE COMPLETE OP) Place 1 drop into the right eye 2 (two) times daily as needed (dry eyes).       Physical Exam: Left shoulder demonstrates painful and guarded motion as noted at her recent office visits.  She has globally decreased strength to manual muscle testing.  Well-healed incision from remote surgery to the left shoulder.  Otherwise neurovascular intact.  Imaging studies confirmed changes consistent with chronic severe rotator cuff tear  arthropathy.  Vitals  Temp:  [97.4 F (36.3 C)] 97.4 F (36.3 C) (02/08 1008) Pulse Rate:  [91] 91 (02/08 1008) Resp:  [15] 15 (02/08 1008) BP: (150)/(86) 150/86 (02/08 1008) SpO2:  [97 %] 97 % (02/08 1008)  Assessment/Plan  Impression: Left shoulder rotator cuff tear arthropathy  Plan of Action: Procedure(s): REVERSE SHOULDER ARTHROPLASTY  Patricia Perales M Joana Nolton 10/30/2022, 11:38 AM Contact # [phone]

## 2022-10-30 NOTE — Op Note (Signed)
10/30/2022  1:53 PM  PATIENT:   Angela Barber  87 y.o. female  PRE-OPERATIVE DIAGNOSIS:  Left shoulder rotator cuff tear arthropathy  POST-OPERATIVE DIAGNOSIS: Same  PROCEDURE: Left shoulder reverse arthroplasty utilizing a press-fit size 8 Arthrex stem with a neutral metathesis, +3 polyethylene insert, 36/+4 glenosphere and a small/+2 baseplate  SURGEON:  Rashelle Ireland, Metta Clines M.D.  ASSISTANTS: Jenetta Loges, PA-C  Jenetta Loges, PA-C was utilized as an Environmental consultant throughout this case, essential for help with positioning the patient, positioning extremity, tissue manipulation, implantation of the prosthesis, suture management, wound closure, and intraoperative decision-making.   ANESTHESIA:   General endotracheal and interscalene block with Exparel  EBL: 100 cc  SPECIMEN: None  Drains: None   PATIENT DISPOSITION:  PACU - hemodynamically stable.    PLAN OF CARE: Discharge to home after PACU  Brief history:  Ms. Polack is an 87 year old female who has been followed for chronic and progressively increasing left shoulder pain related to severe rotator cuff tear arthropathy.  Due to her increasing function limitations and failure to respond to prolonged attempts at conservative management, she is brought to the operating this time for planned left shoulder reverse arthroplasty.  Preoperatively, I counseled the patient regarding treatment options and risks versus benefits thereof.  Possible surgical complications were all reviewed including potential for bleeding, infection, neurovascular injury, persistent pain, loss of motion, anesthetic complication, failure of the implant, and possible need for additional surgery. They understand and accept and agrees with our planned procedure.   Procedure detail:  After undergoing routine preop evaluation the patient received prophylactic antibiotics and interscalene block with Exparel was established in the holding area by the anesthesia  department.  Subsequently placed spine on the operating table and underwent the smooth induction of a general endotracheal anesthesia.  Placed into the beachchair position and appropriately padded and protected.  The left shoulder girdle region was sterilely prepped and draped in standard fashion.  Timeout was called.  A deltopectoral approach to the left shoulder is made an approximately 8 cm incision.  Skin flaps were elevated dissection carried deep and the deltopectoral interval was then developed from proximal to distal with the vein taken laterally.  Conjoined tendon mobilized and retracted medially.  Long head biceps tendon was tenodesed at the upper border the pectoralis major with the proximal segment excised and more proximal we found evidence for prior rotator cuff repair which appeared to have incorporated the more proximal aspect of the bicep tendon this area was excised.  The remnant of the subscapularis was then separated from the lesser tuberosity and tagged with a pair of suture tape sutures.  Superior remnant of the rotator cuff split to the base of the coracoid and then subscap was reflected medially and capsular attachments were then divided from the anterior and inferior margins of the humeral neck allowing deliver the humeral head through the wound.  An extra medullary guide was then used to outline the proposed humeral head resection which we performed with an oscillating saw at approximately 20 degrees of retroversion.  Marginal osteophytes were removed.  A metal cap was then placed over the cut proximal humeral surface and we then exposed the glenoid and performed a circumferential labral resection.  On evaluation there was some mild to moderate glenoid wear posteriorly and to gain appropriate access we did go back and remove an additional 2 mm of bone from the humeral head using an oscillating saw and then once again exposed the glenoid with good exposure  and a guidepin directed into the  center of the glenoid implant was then reamed with central followed by the peripheral reamer to stable subchondral bony bed.  Preparation completed with a drill and tap for a 25 mm lag screw.  This point a baseplate was assembled and was then inserted with vancomycin powder applied to the threads of the lag screw and excellent purchase and fixation was achieved.  The peripheral locking screws were all then placed using standard technique with excellent fixation.  A 36/+4 glenosphere was then impacted onto the baseplate and a central locking screw was placed.  Attention returned to the metaphysis where we did remove a retained suture anchor and the canal was then opened and we broached up to a size 8 at 20 degrees of retroversion.  A neutral metaphyseal reaming guide was then used to repair the metaphysis.  We performed a trial reduction which showed good motion stability and soft tissue balance.  The trial was then removed.  The final implant was assembled.  The canal was irrigated cleaned and dried and vancomycin powder sprayed into the canal and her implant was then seated with excellent fit and fixation.  Trial reductions were then performed and we ultimately felt that the +3 poly gave [place] the best motion stability and soft tissue balance.  The final +3 poly was then impacted onto the implant after was cleaned and dried.  Final reduction was performed which again showed excellent motion stability and soft tissue balance all much more satisfaction.  The wound was then copiously irrigated.  Final hemostasis was obtained.  The subscap was mobilized showing good elasticity and so it was repaired with a single suture back to the eyelid on the pole of the implant.  The deltopectoral interval was reapproximated the series of figure-of-eight #1 Vicryl sutures after weed irrigated cleaned and dried the joint and applied the balance of the vancomycin powder to the deep soft tissue planes.  The subcutaneous layer was closed  with interrupted 2-0 Monocryl sutures and intracuticular 3-0 Monocryl used to close the skin followed by Dermabond and an Aquacel dressing.  Left arm was placed into a sling and the patient was awakened, extubated, and taken to recovery in stable condition.  Marin Shutter MD   Contact # [phone]

## 2022-10-30 NOTE — Evaluation (Signed)
Occupational Therapy Evaluation Patient Details Name: Angela Barber MRN: [id number] DOB: Feb 13, 1936 Today's Date: 10/30/2022   History of Present Illness Patietn s/p left reverse shoulder arthroplasty. PMH includes cochlear implant, R TKA   Clinical Impression   Angela Barber is an 87 year old woman s/p shoulder replacement without functional use of left non- dominant upper extremity secondary to effects of surgery and interscalene block and shoulder precautions. Therapist provided education and instruction to patient and daughter in regards to exercises, precautions, positioning, donning upper extremity clothing and bathing while maintaining shoulder precautions, ice and edema management and donning/doffing sling. Patient had some difficulty hearing therapist due to cochlear implant but daughter verbalized understanding of all instructions. Patient overall needed min assist for UB dressing and setup for feeding. Patient to follow up with MD for further therapy needs.        Recommendations for follow up therapy are one component of a multi-disciplinary discharge planning process, led by the attending physician.  Recommendations may be updated based on patient status, additional functional criteria and insurance authorization.   Follow Up Recommendations  Follow physician's recommendations for discharge plan and follow up therapies     Assistance Recommended at Discharge Intermittent Supervision/Assistance  Patient can return home with the following A little help with walking and/or transfers;Assistance with cooking/housework;Help with stairs or ramp for entrance;Assist for transportation    Functional Status Assessment  Patient has had a recent decline in their functional status and demonstrates the ability to make significant improvements in function in a reasonable and predictable amount of time.  Equipment Recommendations  None recommended by OT    Recommendations for Other  Services       Precautions / Restrictions Precautions Precautions: Shoulder Type of Shoulder Precautions: If sitting in controlled environment, ok to come out of sling to give neck a break. Please sleep in it to protect until follow up in office.    OK to use operative arm for feeding, hygiene and ADLs.  Ok to instruct Pendulums and lap slides as exercises. Ok to use operative arm within the following parameters for ADL purposes    New ROM (8/18)  Ok for PROM, AAROM, AROM within pain tolerance and within the following ROM  ER 20  ABD 45  FE 60      Please instruct Ice machine usage Shoulder Interventions: Shoulder sling/immobilizer;Off for dressing/bathing/exercises Precaution Booklet Issued: No Required Braces or Orthoses: Sling Restrictions Weight Bearing Restrictions: Yes LUE Weight Bearing: Non weight bearing      Mobility Bed Mobility               General bed mobility comments: up in chair    Transfers Overall transfer level: Independent Equipment used: None                      Balance Overall balance assessment: Mild deficits observed, not formally tested                                         ADL either performed or assessed with clinical judgement   ADL                                         General ADL Comments: needed assistance to don shirt but  otherwise mostly mod I.     Vision Patient Visual Report: No change from baseline       Perception     Praxis      Pertinent Vitals/Pain Pain Assessment Pain Assessment: No/denies pain     Hand Dominance Right   Extremity/Trunk Assessment Upper Extremity Assessment Upper Extremity Assessment: LUE deficits/detail LUE Deficits / Details: impaired motor control and sensation secondary to block   Lower Extremity Assessment Lower Extremity Assessment: Overall WFL for tasks assessed   Cervical / Trunk Assessment Cervical / Trunk Assessment: Normal    Communication Communication Communication: HOH   Cognition Arousal/Alertness: Awake/alert Behavior During Therapy: WFL for tasks assessed/performed Overall Cognitive Status: Within Functional Limits for tasks assessed                                       General Comments       Exercises     Shoulder Instructions Shoulder Instructions Donning/doffing shirt without moving shoulder: Caregiver independent with task Method for sponge bathing under operated UE: Caregiver independent with task Donning/doffing sling/immobilizer: Caregiver independent with task Correct positioning of sling/immobilizer: Caregiver independent with task Pendulum exercises (written home exercise program): Caregiver independent with task ROM for elbow, wrist and digits of operated UE: Caregiver independent with task Sling wearing schedule (on at all times/off for ADL's): Caregiver independent with task Proper positioning of operated UE when showering: Caregiver independent with task Dressing change: Caregiver independent with task Positioning of UE while sleeping: Caregiver independent with task    Home Living Family/patient expects to be discharged to:: Private residence Living Arrangements: Alone                               Additional Comments: Daughter will be assisting patient at discharge      Prior Functioning/Environment                          OT Problem List: Decreased strength;Decreased range of motion;Impaired UE functional use      OT Treatment/Interventions:      OT Goals(Current goals can be found in the care plan section) Acute Rehab OT Goals OT Goal Formulation: All assessment and education complete, DC therapy  OT Frequency:      Co-evaluation              AM-PAC OT "6 Clicks" Daily Activity     Outcome Measure Help from another person eating meals?: A Little Help from another person taking care of personal grooming?: None Help  from another person toileting, which includes using toliet, bedpan, or urinal?: None Help from another person bathing (including washing, rinsing, drying)?: None Help from another person to put on and taking off regular upper body clothing?: A Little Help from another person to put on and taking off regular lower body clothing?: None 6 Click Score: 22   End of Session Nurse Communication:  (OT education complete)  Activity Tolerance: Patient tolerated treatment well Patient left: with family/visitor present;in chair  OT Visit Diagnosis: Pain                Time: 1600-1631 OT Time Calculation (min): 31 min Charges:  OT General Charges $OT Visit: 1 Visit OT Evaluation $OT Eval Low Complexity: 1 Low OT Treatments $Self Care/Home Management : 8-22 mins  Danyella Mcginty  Larena Sox, OTR/L [place]  Office [phone]   Lenward Chancellor 10/30/2022, 4:44 PM

## 2022-10-30 NOTE — Discharge Instructions (Signed)

## 2022-10-30 NOTE — Anesthesia Procedure Notes (Signed)
Anesthesia Regional Block: Interscalene brachial plexus block   Pre-Anesthetic Checklist: , timeout performed,  Correct Patient, Correct Site, Correct Laterality,  Correct Procedure, Correct Position, site marked,  Risks and benefits discussed,  Surgical consent,  Pre-op evaluation,  At surgeon's request and post-op pain management  Laterality: Left and Upper  Prep: chloraprep       Needles:  Injection technique: Single-shot      Needle Length: 5cm  Needle Gauge: 22     Additional Needles: Arrow StimuQuik ECHO Echogenic Stimulating PNB Needle  Procedures:,,,, ultrasound used (permanent image in chart),,    Narrative:  Start time: 10/30/2022 12:06 PM End time: 10/30/2022 12:12 PM Injection made incrementally with aspirations every 5 mL.  Performed by: Personally  Anesthesiologist: Oleta Mouse, MD

## 2022-10-30 NOTE — Anesthesia Procedure Notes (Addendum)
Procedure Name: Intubation Date/Time: 10/30/2022 12:32 PM  Performed by: Maxwell Caul, CRNAPre-anesthesia Checklist: Patient identified, Emergency Drugs available, Suction available, Patient being monitored and Timeout performed Patient Re-evaluated:Patient Re-evaluated prior to induction Oxygen Delivery Method: Circle system utilized Preoxygenation: Pre-oxygenation with 100% oxygen Induction Type: Rapid sequence Laryngoscope Size: Mac and 4 Grade View: Grade I Tube type: Oral Tube size: 7.0 mm Number of attempts: 1 Airway Equipment and Method: Stylet Placement Confirmation: ETT inserted through vocal cords under direct vision, breath sounds checked- equal and bilateral and positive ETCO2 Secured at: 22 cm Tube secured with: Tape Comments: Intubation by Larena Glassman

## 2022-10-30 NOTE — Anesthesia Postprocedure Evaluation (Signed)
Anesthesia Post Note  Patient: Angela Barber  Procedure(s) Performed: REVERSE SHOULDER ARTHROPLASTY (Left: Shoulder)     Patient location during evaluation: PACU Anesthesia Type: General and Regional Level of consciousness: awake and alert Pain management: pain level controlled Vital Signs Assessment: post-procedure vital signs reviewed and stable Respiratory status: spontaneous breathing, nonlabored ventilation and respiratory function stable Cardiovascular status: blood pressure returned to baseline and stable Postop Assessment: no apparent nausea or vomiting Anesthetic complications: no   No notable events documented.  Last Vitals:  Vitals:   10/30/22 1500 10/30/22 1542  BP: (!) 156/90 118/78  Pulse: 92 92  Resp: 19 18  Temp:  36.5 C  SpO2: 93% 95%    Last Pain:  Vitals:   10/30/22 1542  TempSrc: Oral  PainSc: 0-No pain                 Jamyria Ozanich

## 2022-10-30 NOTE — Anesthesia Preprocedure Evaluation (Signed)
Anesthesia Evaluation  Patient identified by MRN, date of birth, ID band Patient awake    Reviewed: Allergy & Precautions, NPO status , Patient's Chart, lab work & pertinent test results  History of Anesthesia Complications Negative for: history of anesthetic complications  Airway Mallampati: III  TM Distance: >3 FB Neck ROM: Full    Dental  (+) Teeth Intact, Dental Advisory Given   Pulmonary neg shortness of breath, neg sleep apnea, neg COPD, neg recent URI, former smoker   breath sounds clear to auscultation       Cardiovascular hypertension, (-) Past MI and (-) CHF  Rhythm:Regular     Neuro/Psych  PSYCHIATRIC DISORDERS Anxiety Depression    negative neurological ROS     GI/Hepatic Neg liver ROS,GERD  ,,  Endo/Other  negative endocrine ROS    Renal/GU negative Renal ROS     Musculoskeletal  (+) Arthritis ,  Left shoulder rotator cuff tear arthropathy   Abdominal   Peds  Hematology negative hematology ROS (+)   Anesthesia Other Findings   Reproductive/Obstetrics                             Anesthesia Physical Anesthesia Plan  ASA: 2  Anesthesia Plan: General   Post-op Pain Management: Regional block*   Induction: Intravenous  PONV Risk Score and Plan: 3 and Ondansetron, Dexamethasone, Propofol infusion and TIVA  Airway Management Planned: Oral ETT  Additional Equipment: None  Intra-op Plan:   Post-operative Plan: Extubation in OR  Informed Consent: I have reviewed the patients History and Physical, chart, labs and discussed the procedure including the risks, benefits and alternatives for the proposed anesthesia with the patient or authorized representative who has indicated his/her understanding and acceptance.     Dental advisory given  Plan Discussed with: CRNA  Anesthesia Plan Comments:        Anesthesia Quick Evaluation

## 2022-10-30 NOTE — Transfer of Care (Signed)
Immediate Anesthesia Transfer of Care Note  Patient: Angela Barber  Procedure(s) Performed: REVERSE SHOULDER ARTHROPLASTY (Left: Shoulder)  Patient Location: PACU  Anesthesia Type:General  Level of Consciousness: awake, alert , and oriented  Airway & Oxygen Therapy: Patient Spontanous Breathing and Patient connected to face mask oxygen  Post-op Assessment: Report given to RN and Post -op Vital signs reviewed and stable  Post vital signs: Reviewed and stable  Last Vitals:  Vitals Value Taken Time  BP 163/92 10/30/22 1401  Temp    Pulse 88 10/30/22 1403  Resp 18 10/30/22 1403  SpO2 100 % 10/30/22 1403  Vitals shown include unvalidated device data.  Last Pain:  Vitals:   10/30/22 1216  TempSrc:   PainSc: 0-No pain         Complications: No notable events documented.

## 2022-10-31 ENCOUNTER — Encounter (HOSPITAL_COMMUNITY): Payer: Self-pay | Admitting: Orthopedic Surgery

## 2022-11-10 DIAGNOSIS — Z96612 Presence of left artificial shoulder joint: Secondary | ICD-10-CM | POA: Diagnosis not present

## 2022-11-10 DIAGNOSIS — Z471 Aftercare following joint replacement surgery: Secondary | ICD-10-CM | POA: Diagnosis not present

## 2022-11-26 DIAGNOSIS — H43813 Vitreous degeneration, bilateral: Secondary | ICD-10-CM | POA: Diagnosis not present

## 2022-11-26 DIAGNOSIS — H04123 Dry eye syndrome of bilateral lacrimal glands: Secondary | ICD-10-CM | POA: Diagnosis not present

## 2022-11-26 DIAGNOSIS — H532 Diplopia: Secondary | ICD-10-CM | POA: Diagnosis not present

## 2022-11-26 DIAGNOSIS — H40013 Open angle with borderline findings, low risk, bilateral: Secondary | ICD-10-CM | POA: Diagnosis not present

## 2022-11-26 DIAGNOSIS — H353131 Nonexudative age-related macular degeneration, bilateral, early dry stage: Secondary | ICD-10-CM | POA: Diagnosis not present

## 2022-11-26 DIAGNOSIS — Z961 Presence of intraocular lens: Secondary | ICD-10-CM | POA: Diagnosis not present

## 2022-11-27 DIAGNOSIS — M25512 Pain in left shoulder: Secondary | ICD-10-CM | POA: Diagnosis not present

## 2022-12-05 DIAGNOSIS — M25512 Pain in left shoulder: Secondary | ICD-10-CM | POA: Diagnosis not present

## 2022-12-10 DIAGNOSIS — M25512 Pain in left shoulder: Secondary | ICD-10-CM | POA: Diagnosis not present

## 2022-12-15 ENCOUNTER — Other Ambulatory Visit: Payer: Self-pay | Admitting: Internal Medicine

## 2022-12-15 DIAGNOSIS — M25512 Pain in left shoulder: Secondary | ICD-10-CM | POA: Diagnosis not present

## 2022-12-19 DIAGNOSIS — M25512 Pain in left shoulder: Secondary | ICD-10-CM | POA: Diagnosis not present

## 2022-12-22 DIAGNOSIS — M25512 Pain in left shoulder: Secondary | ICD-10-CM | POA: Diagnosis not present

## 2022-12-29 DIAGNOSIS — M25512 Pain in left shoulder: Secondary | ICD-10-CM | POA: Diagnosis not present

## 2023-01-21 DIAGNOSIS — Z96612 Presence of left artificial shoulder joint: Secondary | ICD-10-CM | POA: Diagnosis not present

## 2023-01-21 DIAGNOSIS — Z471 Aftercare following joint replacement surgery: Secondary | ICD-10-CM | POA: Diagnosis not present

## 2023-01-26 DIAGNOSIS — M25512 Pain in left shoulder: Secondary | ICD-10-CM | POA: Diagnosis not present

## 2023-01-28 DIAGNOSIS — M25512 Pain in left shoulder: Secondary | ICD-10-CM | POA: Diagnosis not present

## 2023-02-02 DIAGNOSIS — M25512 Pain in left shoulder: Secondary | ICD-10-CM | POA: Diagnosis not present

## 2023-02-04 ENCOUNTER — Other Ambulatory Visit: Payer: Self-pay | Admitting: Internal Medicine

## 2023-02-04 DIAGNOSIS — M25512 Pain in left shoulder: Secondary | ICD-10-CM | POA: Diagnosis not present

## 2023-02-06 ENCOUNTER — Telehealth: Payer: Self-pay | Admitting: Internal Medicine

## 2023-02-06 NOTE — Telephone Encounter (Signed)
Ok for 90 day fill but is due for physical so please schedule within 90 days

## 2023-02-06 NOTE — Telephone Encounter (Signed)
Prescription Request  02/06/2023  LOV: 08/05/2022  What is the name of the medication or equipment?  cholestyramine (QUESTRAN) 4 g packet   Have you contacted your pharmacy to request a refill? Yes   Which pharmacy would you like this sent to?  [facility] [place], [place] - [address] [place] [address] Phone: [phone] Fax: [phone]   Patient notified that their request is being sent to the clinical staff for review and that they should receive a response within 2 business days.   Please advise at Mobile [phone] (mobile)

## 2023-02-06 NOTE — Telephone Encounter (Signed)
Ok to refill 

## 2023-02-08 ENCOUNTER — Other Ambulatory Visit: Payer: Self-pay | Admitting: Internal Medicine

## 2023-02-09 DIAGNOSIS — M25512 Pain in left shoulder: Secondary | ICD-10-CM | POA: Diagnosis not present

## 2023-02-09 MED ORDER — CHOLESTYRAMINE 4 G PO PACK: PACK | ORAL | 0 refills | Status: DC

## 2023-02-09 NOTE — Telephone Encounter (Addendum)
LVM for patient to get scheduled for physical

## 2023-02-09 NOTE — Addendum Note (Signed)
Addended by: Deatra James on: 02/09/2023 10:37 AM   Modules accepted: Orders

## 2023-02-11 DIAGNOSIS — Z7952 Long term (current) use of systemic steroids: Secondary | ICD-10-CM | POA: Diagnosis not present

## 2023-02-11 DIAGNOSIS — M810 Age-related osteoporosis without current pathological fracture: Secondary | ICD-10-CM | POA: Diagnosis not present

## 2023-02-11 DIAGNOSIS — N951 Menopausal and female climacteric states: Secondary | ICD-10-CM | POA: Diagnosis not present

## 2023-02-11 LAB — HM DEXA SCAN

## 2023-02-12 ENCOUNTER — Encounter: Payer: Self-pay | Admitting: Internal Medicine

## 2023-02-20 DIAGNOSIS — M25512 Pain in left shoulder: Secondary | ICD-10-CM | POA: Diagnosis not present

## 2023-02-23 DIAGNOSIS — M25512 Pain in left shoulder: Secondary | ICD-10-CM | POA: Diagnosis not present

## 2023-02-25 DIAGNOSIS — M25512 Pain in left shoulder: Secondary | ICD-10-CM | POA: Diagnosis not present

## 2023-02-26 ENCOUNTER — Encounter: Payer: Self-pay | Admitting: Internal Medicine

## 2023-02-26 ENCOUNTER — Ambulatory Visit (INDEPENDENT_AMBULATORY_CARE_PROVIDER_SITE_OTHER): Payer: Medicare PPO | Admitting: Internal Medicine

## 2023-02-26 VITALS — BP 138/82 | HR 85 | Temp 97.7°F | Ht 63.0 in | Wt 151.0 lb

## 2023-02-26 DIAGNOSIS — H9193 Unspecified hearing loss, bilateral: Secondary | ICD-10-CM | POA: Diagnosis not present

## 2023-02-26 DIAGNOSIS — Z9621 Cochlear implant status: Secondary | ICD-10-CM | POA: Diagnosis not present

## 2023-02-26 DIAGNOSIS — E782 Mixed hyperlipidemia: Secondary | ICD-10-CM | POA: Diagnosis not present

## 2023-02-26 DIAGNOSIS — Z0000 Encounter for general adult medical examination without abnormal findings: Secondary | ICD-10-CM | POA: Diagnosis not present

## 2023-02-26 DIAGNOSIS — N3946 Mixed incontinence: Secondary | ICD-10-CM | POA: Diagnosis not present

## 2023-02-26 DIAGNOSIS — M810 Age-related osteoporosis without current pathological fracture: Secondary | ICD-10-CM

## 2023-02-26 DIAGNOSIS — K529 Noninfective gastroenteritis and colitis, unspecified: Secondary | ICD-10-CM

## 2023-02-26 LAB — LIPID PANEL
Cholesterol: 198 mg/dL (ref 0–200)
HDL: 46.9 mg/dL (ref >39.00)
NonHDL: 150.87
Total CHOL/HDL Ratio: 4
Triglycerides: 267 mg/dL — ABNORMAL HIGH (ref 0.0–149.0)
VLDL: 53.4 mg/dL — ABNORMAL HIGH (ref 0.0–40.0)

## 2023-02-26 LAB — COMPREHENSIVE METABOLIC PANEL
ALT: 18 U/L (ref 0–35)
AST: 23 U/L (ref 0–37)
Albumin: 4.3 g/dL (ref 3.5–5.2)
Alkaline Phosphatase: 58 U/L (ref 39–117)
BUN: 20 mg/dL (ref 6–23)
CO2: 32 mEq/L (ref 19–32)
Calcium: 9.6 mg/dL (ref 8.4–10.5)
Chloride: 101 mEq/L (ref 96–112)
Creatinine, Ser: 0.72 mg/dL (ref 0.40–1.20)
GFR: 76.38 mL/min (ref >60.00)
Glucose, Bld: 77 mg/dL (ref 70–99)
Potassium: 4.2 mEq/L (ref 3.5–5.1)
Sodium: 141 mEq/L (ref 135–145)
Total Bilirubin: 0.5 mg/dL (ref 0.2–1.2)
Total Protein: 7 g/dL (ref 6.0–8.3)

## 2023-02-26 LAB — CBC
HCT: 44.8 % (ref 36.0–46.0)
Hemoglobin: 14.8 g/dL (ref 12.0–15.0)
MCHC: 33.1 g/dL (ref 30.0–36.0)
MCV: 92.5 fl (ref 78.0–100.0)
Platelets: 302 10*3/uL (ref 150.0–400.0)
RBC: 4.85 Mil/uL (ref 3.87–5.11)
RDW: 13.7 % (ref 11.5–15.5)
WBC: 7.8 10*3/uL (ref 4.0–10.5)

## 2023-02-26 LAB — VITAMIN D 25 HYDROXY (VIT D DEFICIENCY, FRACTURES): VITD: 28.74 ng/mL — ABNORMAL LOW (ref 30.00–100.00)

## 2023-02-26 LAB — LDL CHOLESTEROL, DIRECT: Direct LDL: 120 mg/dL

## 2023-02-26 MED ORDER — CHOLESTYRAMINE 4 G PO PACK: 4.0000 g | PACK | Freq: Every day | ORAL | 3 refills | Status: DC

## 2023-02-26 MED ORDER — GEMTESA 75 MG PO TABS: 1.0000 | ORAL_TABLET | Freq: Every day | ORAL | 3 refills | Status: DC

## 2023-02-26 MED ORDER — FLUTICASONE PROPIONATE 50 MCG/ACT NA SUSP: 2.0000 | Freq: Every day | NASAL | 6 refills | Status: DC

## 2023-02-26 NOTE — Patient Instructions (Addendum)
For the bones we can do prolia twice a year let us know if you want to start this.  We will get you in with the urogynecologist to help the incontinence.

## 2023-02-26 NOTE — Progress Notes (Signed)
Subjective:   Patient ID: Angela Barber, female    DOB: 1936/05/29, 87 y.o.   MRN: [id number]  HPI Here for medicare wellness and physical, no new complaints. Please see A/P for status and treatment of chronic medical problems.   Diet: heart healthy Physical activity: walks 1 mile daily Depression/mood screen: negative Hearing: cochlear implant bilaterally, still some impairement Visual acuity: grossly normal, reading lens, performs annual eye exam  ADLs: capable Fall risk: none Home safety: good Cognitive evaluation: intact to orientation, naming, recall and repetition EOL planning: adv directives discussed, in place  [employer] Visit from 08/05/2022 in [facility] [place]  PHQ-2 Total Score 0       Flowsheet Row Office Visit from 08/05/2022 in [facility]  PHQ-9 Total Score 0         05/21/2021   10:00 PM 05/22/2021    8:00 AM 02/13/2022    3:53 PM 08/05/2022   10:06 AM 02/26/2023   10:50 AM  Fall Risk  Falls in the past year?   0 0 0  Was there an injury with Fall?   0 0 0  Fall Risk Category Calculator   0 0 0  Fall Risk Category (Retired)   Low Low   (RETIRED) Patient Fall Risk Level High fall risk High fall risk Low fall risk    Patient at Risk for Falls Due to   No Fall Risks    Fall risk Follow up   Falls evaluation completed Falls evaluation completed Falls evaluation completed    I have personally reviewed and have noted 1. The patient's medical and social history - reviewed today no changes 2. Their use of alcohol, tobacco or illicit drugs 3. Their current medications and supplements 4. The patient's functional ability including ADL's, fall risks, home safety risks and hearing or visual impairment. 5. Diet and physical activities 6. Evidence for depression or mood disorders 7. Care team reviewed and updated 8.  The patient is not on an opioid pain medication.  Patient Care  Team: Myrlene Broker, MD as PCP - General (Internal Medicine) [facility] as Consulting Physician (Ophthalmology) Sallye Lat, MD as Consulting Physician (Ophthalmology) Past Medical History:  Diagnosis Date   Anxiety    C. difficile diarrhea 05/23/2005   Cochlear implant status    Depression    Diverticulosis    Family history of adverse reaction to anesthesia    sister had cognitive decline after surgery   GERD (gastroesophageal reflux disease)    occasional   Hearing loss of both ears    HLD (hyperlipidemia)    Hyperplastic colon polyp    Hypertension    Insomnia    Internal hemorrhoid    Osteoarthritis, knee    Past Surgical History:  Procedure Laterality Date   bilateral cataract surgery      with IOL   BUNIONECTOMY     right   CHOLECYSTECTOMY N/A 07/25/2016   Procedure: LAPAROSCOPIC CHOLECYSTECTOMY;  Surgeon: Avel Peace, MD;  Location: [facility];  Service: General;  Laterality: N/A;   COCHLEAR IMPLANT  03/22/2012   right ear. Dr. Lenoria Farrier   ERCP N/A 07/31/2016   Procedure: ENDOSCOPIC RETROGRADE CHOLANGIOPANCREATOGRAPHY (ERCP);  Surgeon: Rachael Fee, MD;  Location: [facility];  Service: Endoscopy;  Laterality: N/A;   REVERSE SHOULDER ARTHROPLASTY Left 10/30/2022   Procedure: REVERSE SHOULDER ARTHROPLASTY;  Surgeon: Francena Hanly, MD;  Location: [facility];  Service: Orthopedics;  Laterality: Left;    ROTATOR CUFF REPAIR Left 09/22/2010   ROTATOR CUFF REPAIR Right 2007 & 2009   TONSILLECTOMY     TOTAL KNEE ARTHROPLASTY Right 05/20/2021   Procedure: TOTAL KNEE ARTHROPLASTY;  Surgeon: Ollen Gross, MD;  Location: [facility];  Service: Orthopedics;  Laterality: Right;   VAGINAL HYSTERECTOMY     Family History  Problem Relation Age of Onset   Ovarian cancer Mother    Transient ischemic attack Mother    Breast cancer Maternal Aunt        aunts   Stroke Sister    Anxiety disorder Daughter    Autoimmune disease Daughter     Review of Systems  Constitutional: Negative.   HENT:  Positive for hearing loss and postnasal drip.   Eyes: Negative.   Respiratory:  Negative for cough, chest tightness and shortness of breath.   Cardiovascular:  Negative for chest pain, palpitations and leg swelling.  Gastrointestinal:  Positive for diarrhea. Negative for abdominal distention, abdominal pain, constipation, nausea and vomiting.  Genitourinary:        Incontinence  Musculoskeletal: Negative.   Skin: Negative.   Neurological: Negative.   Psychiatric/Behavioral: Negative.      Objective:  Physical Exam Constitutional:      Appearance: She is well-developed.  HENT:     Head: Normocephalic and atraumatic.  Cardiovascular:     Rate and Rhythm: Normal rate and regular rhythm.  Pulmonary:     Effort: Pulmonary effort is normal. No respiratory distress.     Breath sounds: Normal breath sounds. No wheezing or rales.  Abdominal:     General: Bowel sounds are normal. There is no distension.     Palpations: Abdomen is soft.     Tenderness: There is no abdominal tenderness. There is no rebound.  Musculoskeletal:     Cervical back: Normal range of motion.  Skin:    General: Skin is warm and dry.  Neurological:     Mental Status: She is alert and oriented to person, place, and time.     Coordination: Coordination normal.     Vitals:   02/26/23 1048  BP: 138/82  Pulse: 85  Temp: 97.7 F (36.5 C)  TempSrc: Oral  SpO2: 95%  Weight: 151 lb (68.5 kg)  Height: 5\' 3"  (1.6 m)    Assessment & Plan:

## 2023-02-27 DIAGNOSIS — M810 Age-related osteoporosis without current pathological fracture: Secondary | ICD-10-CM | POA: Insufficient documentation

## 2023-02-27 NOTE — Assessment & Plan Note (Signed)
Taking gemtesa and this is not working as well lately. Referral to urogyn done for her to explore options.

## 2023-02-27 NOTE — Assessment & Plan Note (Signed)
Overall worsening and has cochlear implant going to get this adjusted soon.

## 2023-02-27 NOTE — Assessment & Plan Note (Signed)
Flu shot yearly. Pneumonia complete. Shingrix complete. Tetanus due at pharmacy. Colonoscopy complete. Mammogram complete, pap smear complete and dexa due 2-3 years if desired. Counseled about sun safety and mole surveillance. Counseled about the dangers of distracted driving. Given 10 year screening recommendations.

## 2023-02-27 NOTE — Assessment & Plan Note (Signed)
Reviewed DEXA and previously got jaw bone issues with fosamax which she took for 15 years or so. Counseled about prolia and checking CMP and vitamin D in case she decides to start treatment.

## 2023-02-27 NOTE — Assessment & Plan Note (Signed)
Taking cholestyramine and diarrhea maybe once a week but very little warning and sometimes cannot get to a bathroom.Advised if she is traveling she could take 2 packets of cholestyramine.

## 2023-02-27 NOTE — Assessment & Plan Note (Signed)
Checking lipid panel and adjust as needed. Not on medication.  

## 2023-03-02 DIAGNOSIS — M25512 Pain in left shoulder: Secondary | ICD-10-CM | POA: Diagnosis not present

## 2023-03-04 DIAGNOSIS — M25512 Pain in left shoulder: Secondary | ICD-10-CM | POA: Diagnosis not present

## 2023-03-18 DIAGNOSIS — Z45321 Encounter for adjustment and management of cochlear device: Secondary | ICD-10-CM | POA: Diagnosis not present

## 2023-03-18 DIAGNOSIS — Z974 Presence of external hearing-aid: Secondary | ICD-10-CM | POA: Diagnosis not present

## 2023-03-18 DIAGNOSIS — H903 Sensorineural hearing loss, bilateral: Secondary | ICD-10-CM | POA: Diagnosis not present

## 2023-03-24 DIAGNOSIS — M25512 Pain in left shoulder: Secondary | ICD-10-CM | POA: Diagnosis not present

## 2023-03-30 DIAGNOSIS — M25512 Pain in left shoulder: Secondary | ICD-10-CM | POA: Diagnosis not present

## 2023-04-03 DIAGNOSIS — M25512 Pain in left shoulder: Secondary | ICD-10-CM | POA: Diagnosis not present

## 2023-04-24 ENCOUNTER — Other Ambulatory Visit (HOSPITAL_COMMUNITY)
Admission: RE | Admit: 2023-04-24 | Discharge: 2023-04-24 | Disposition: A | Discharge status: Home / Self Care | Payer: Medicare PPO | Source: Other Acute Inpatient Hospital | Attending: Obstetrics and Gynecology | Admitting: Obstetrics and Gynecology

## 2023-04-24 ENCOUNTER — Encounter: Payer: Self-pay | Admitting: Obstetrics and Gynecology

## 2023-04-24 ENCOUNTER — Ambulatory Visit: Payer: Medicare PPO | Admitting: Obstetrics and Gynecology

## 2023-04-24 VITALS — BP 132/85 | HR 116 | Ht 62.5 in | Wt 147.8 lb

## 2023-04-24 DIAGNOSIS — R152 Fecal urgency: Secondary | ICD-10-CM

## 2023-04-24 DIAGNOSIS — R319 Hematuria, unspecified: Secondary | ICD-10-CM | POA: Diagnosis not present

## 2023-04-24 DIAGNOSIS — R3915 Urgency of urination: Secondary | ICD-10-CM | POA: Diagnosis not present

## 2023-04-24 DIAGNOSIS — N3281 Overactive bladder: Secondary | ICD-10-CM | POA: Diagnosis not present

## 2023-04-24 DIAGNOSIS — R350 Frequency of micturition: Secondary | ICD-10-CM

## 2023-04-24 DIAGNOSIS — R159 Full incontinence of feces: Secondary | ICD-10-CM | POA: Diagnosis not present

## 2023-04-24 DIAGNOSIS — N390 Urinary tract infection, site not specified: Secondary | ICD-10-CM | POA: Diagnosis not present

## 2023-04-24 DIAGNOSIS — R82998 Other abnormal findings in urine: Secondary | ICD-10-CM

## 2023-04-24 LAB — POCT URINALYSIS DIPSTICK
Bilirubin, UA: NEGATIVE
Glucose, UA: NEGATIVE
Ketones, UA: NEGATIVE
Nitrite, UA: POSITIVE
Protein, UA: NEGATIVE
Spec Grav, UA: 1.02 (ref 1.010–1.025)
Urobilinogen, UA: 0.2 E.U./dL
pH, UA: 5.5 (ref 5.0–8.0)

## 2023-04-24 LAB — URINALYSIS, ROUTINE W REFLEX MICROSCOPIC
Bilirubin Urine: NEGATIVE
Glucose, UA: NEGATIVE mg/dL
Hgb urine dipstick: NEGATIVE
Ketones, ur: NEGATIVE mg/dL
Nitrite: POSITIVE — AB
Protein, ur: NEGATIVE mg/dL
Specific Gravity, Urine: 1.012 (ref 1.005–1.030)
pH: 5 (ref 5.0–8.0)

## 2023-04-24 MED ORDER — TROSPIUM CHLORIDE ER 60 MG PO CP24: 60.0000 mg | ORAL_CAPSULE | Freq: Every day | ORAL | 5 refills | Status: DC

## 2023-04-24 NOTE — Patient Instructions (Signed)
Stop the Gemtesa and start Trospium daily  Start pelvic floor PT to assist in bowel leakage and urinary frequency/urgency.
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
## 2023-04-27 DIAGNOSIS — Z96612 Presence of left artificial shoulder joint: Secondary | ICD-10-CM | POA: Diagnosis not present

## 2023-04-27 MED ORDER — SULFAMETHOXAZOLE-TRIMETHOPRIM 800-160 MG PO TABS: 1.0000 | ORAL_TABLET | Freq: Two times a day (BID) | ORAL | 0 refills | Status: AC

## 2023-04-27 NOTE — Addendum Note (Signed)
Addended by: Selmer Dominion on: 04/27/2023 08:35 AM   Modules accepted: Orders

## 2023-04-27 NOTE — Progress Notes (Signed)
Please call and inform patient she has a UTI based on urine taken Friday. Antibiotics sent to pharmacy

## 2023-05-06 NOTE — Addendum Note (Signed)
Addended by: Selmer Dominion on: 05/06/2023 01:56 PM   Modules accepted: Orders

## 2023-06-03 DIAGNOSIS — Z9621 Cochlear implant status: Secondary | ICD-10-CM | POA: Diagnosis not present

## 2023-06-03 DIAGNOSIS — Z45321 Encounter for adjustment and management of cochlear device: Secondary | ICD-10-CM | POA: Diagnosis not present

## 2023-06-03 DIAGNOSIS — H903 Sensorineural hearing loss, bilateral: Secondary | ICD-10-CM | POA: Diagnosis not present

## 2023-06-03 DIAGNOSIS — Z974 Presence of external hearing-aid: Secondary | ICD-10-CM | POA: Diagnosis not present

## 2023-06-05 DIAGNOSIS — L814 Other melanin hyperpigmentation: Secondary | ICD-10-CM | POA: Diagnosis not present

## 2023-06-05 DIAGNOSIS — C44622 Squamous cell carcinoma of skin of right upper limb, including shoulder: Secondary | ICD-10-CM | POA: Diagnosis not present

## 2023-06-05 DIAGNOSIS — D485 Neoplasm of uncertain behavior of skin: Secondary | ICD-10-CM | POA: Diagnosis not present

## 2023-06-05 DIAGNOSIS — L821 Other seborrheic keratosis: Secondary | ICD-10-CM | POA: Diagnosis not present

## 2023-06-05 DIAGNOSIS — D225 Melanocytic nevi of trunk: Secondary | ICD-10-CM | POA: Diagnosis not present

## 2023-06-05 DIAGNOSIS — D224 Melanocytic nevi of scalp and neck: Secondary | ICD-10-CM | POA: Diagnosis not present

## 2023-06-05 DIAGNOSIS — L570 Actinic keratosis: Secondary | ICD-10-CM | POA: Diagnosis not present

## 2023-06-05 DIAGNOSIS — L578 Other skin changes due to chronic exposure to nonionizing radiation: Secondary | ICD-10-CM | POA: Diagnosis not present

## 2023-06-12 ENCOUNTER — Ambulatory Visit: Payer: Medicare PPO | Attending: Obstetrics and Gynecology | Admitting: Physical Therapy

## 2023-06-12 ENCOUNTER — Other Ambulatory Visit: Payer: Self-pay

## 2023-06-12 ENCOUNTER — Encounter: Payer: Self-pay | Admitting: Physical Therapy

## 2023-06-12 DIAGNOSIS — M6281 Muscle weakness (generalized): Secondary | ICD-10-CM | POA: Insufficient documentation

## 2023-06-12 DIAGNOSIS — R278 Other lack of coordination: Secondary | ICD-10-CM | POA: Insufficient documentation

## 2023-06-12 NOTE — Therapy (Unsigned)
OUTPATIENT PHYSICAL THERAPY FEMALE PELVIC EVALUATION   Patient Name: Angela Barber MRN: [id number] DOB:[birth year], 87 y.o., female Today's Date: 06/12/2023  END OF SESSION:  PT End of Session - 06/12/23 1028     Visit Number 1    Date for PT Re-Evaluation 08/07/23    Authorization Type [employer]    PT Start Time 1030   came late and had to go to the bathroom   PT Stop Time 1100    PT Time Calculation (min) 30 min    Activity Tolerance Patient tolerated treatment well    Behavior During Therapy [facility] for tasks assessed/performed             Past Medical History:  Diagnosis Date   Anxiety    C. difficile diarrhea 05/23/2005   Cochlear implant status    Depression    Diverticulosis    Family history of adverse reaction to anesthesia    sister had cognitive decline after surgery   GERD (gastroesophageal reflux disease)    occasional   Hearing loss of both ears    HLD (hyperlipidemia)    Hyperplastic colon polyp    Hypertension    Insomnia    Internal hemorrhoid    Osteoarthritis, knee    Past Surgical History:  Procedure Laterality Date   bilateral cataract surgery      with IOL   BUNIONECTOMY     right   CHOLECYSTECTOMY N/A 07/25/2016   Procedure: LAPAROSCOPIC CHOLECYSTECTOMY;  Surgeon: Avel Peace, MD;  Location: [facility];  Service: General;  Laterality: N/A;   COCHLEAR IMPLANT  03/22/2012   right ear. Dr. Lenoria Farrier   ERCP N/A 07/31/2016   Procedure: ENDOSCOPIC RETROGRADE CHOLANGIOPANCREATOGRAPHY (ERCP);  Surgeon: Rachael Fee, MD;  Location: [facility];  Service: Endoscopy;  Laterality: N/A;   REVERSE SHOULDER ARTHROPLASTY Left 10/30/2022   Procedure: REVERSE SHOULDER ARTHROPLASTY;  Surgeon: Francena Hanly, MD;  Location: [facility];  Service: Orthopedics;  Laterality: Left;    ROTATOR CUFF REPAIR Left 09/22/2010   ROTATOR CUFF REPAIR Right 2007 & 2009   TONSILLECTOMY     TOTAL KNEE ARTHROPLASTY Right 05/20/2021   Procedure: TOTAL KNEE ARTHROPLASTY;   Surgeon: Ollen Gross, MD;  Location: [facility];  Service: Orthopedics;  Laterality: Right;   VAGINAL HYSTERECTOMY     Patient Active Problem List   Diagnosis Date Noted   Age-related osteoporosis without current pathological fracture 02/27/2023   Cochlear implant in place 02/26/2023   Rash 08/05/2022   Diplopia 07/18/2022   BPPV (benign paroxysmal positional vertigo) 01/07/2022   OA (osteoarthritis) of knee 05/20/2021   Pre-operative cardiovascular examination 05/17/2021   Bilateral shoulder pain 06/29/2020   Chronic diarrhea 06/28/2020   Primary osteoarthritis of both knees 01/06/2020   Urinary incontinence 10/14/2017   Irregular heart beat 08/09/2016   Symptomatic varicose veins of both lower extremities 07/25/2015   Routine general medical examination at a health care facility 08/13/2014   Insomnia due to psychological stress 04/04/2014   Hyperlipidemia 05/24/2009   Hearing loss 04/25/2008   CLOSTRIDIUM DIFFICILE COLITIS, HX OF 10/23/2007    PCP: none  REFERRING PROVIDER: Selmer Dominion, NP   REFERRING DIAG:  N32.81 (ICD-10-CM) - OAB (overactive bladder)  R39.15 (ICD-10-CM) - Urinary urgency  R35.0 (ICD-10-CM) - Urinary frequency  R15.9,R15.2 (ICD-10-CM) - Incontinence of feces with fecal urgency    THERAPY DIAG:  No diagnosis found.  Rationale for Evaluation and Treatment: Rehabilitation  ONSET DATE: 2024  SUBJECTIVE:  SUBJECTIVE STATEMENT: Sudden onset of urinary and fecal leakage.  Fluid intake: Yes: water, hot tea at night, Bao    PAIN:  Are you having pain? No  PRECAUTIONS: None  RED FLAGS: None   WEIGHT BEARING RESTRICTIONS: No  FALLS:  Has patient fallen in last 6 months? No  LIVING ENVIRONMENT: Lives with: lives alone   OCCUPATION: retired  PLOF:  Independent  PATIENT GOALS: regaining control of fecal and urinary leakage  PERTINENT HISTORY:  Hypertension; Diverticulosis;;Cholecystectomy;Vaginal hysterectomy; Reverse left shoulder arthroplasty; Right total knee arthroplasty  BOWEL MOVEMENT: Pain with bowel movement: No Type of bowel movement:Type (Bristol Stool Scale) without medication is Type 1-4, Frequency 2-3, and Strain No Fully empty rectum: Yes:   Leakage: Yes: no warning but feels it come out Fiber supplement: No  URINATION: Pain with urination: No Fully empty bladder: Yes:   Stream: Strong Urgency: Yes:   Frequency: Day time voids 4.  Nocturia: 3 times per night to void.  Leakage: Urge to void, Walking to the bathroom, Coughing, Sneezing, Laughing, and sit to stand Pads: Yes: 2 pads  INTERCOURSE: not active  PREGNANCY: Vaginal deliveries 2 Tearing Yes:     PROLAPSE: None   OBJECTIVE:   DIAGNOSTIC FINDINGS:  Pelvic floor strength I/V  Pelvic floor musculature: Right levator non-tender, Right obturator tender, Left levator tender, Left obturator tender  PVR of 25 ml was obtained by bladder scan  PATIENT SURVEYS:  [id number]  COGNITION: Overall cognitive status: Within functional limits for tasks assessed     SENSATION: Light touch: Appears intact Proprioception: Appears intact   GAIT: Assistive device utilized: None Level of assistance: Complete Independence Comments: shuffles feet  POSTURE: rounded shoulders and forward head  PELVIC ALIGNMENT:ASIS are equal    LOWER EXTREMITY ROM: Bilateral hip ROM is full   LOWER EXTREMITY MMT:  MMT Right eval Left eval  Hip extension 4/5 4/5  Hip abduction 4/5 4/5  Hip adduction 4/5 4/5   PALPATION:   General  Patient will bulge the lower abdomen with abdominal contraction                External Perineal Exam intact                             Internal Pelvic Floor low tone  Patient confirms identification and approves PT to assess  internal pelvic floor and treatment Yes  PELVIC MMT:   MMT eval  Vaginal 2/5  Internal Anal Sphincter 2/5 holding 20 sec  External Anal Sphincter 2/5  Puborectalis 3/5  (Blank rows = not tested)        TONE: low   TODAY'S TREATMENT:                                                                                                                              DATE: 06/12/23  EVAL See below   PATIENT EDUCATION:  06/12/23 Education details: Access Code: [email or web address] Person educated: Patient Education method: Programmer, multimedia, Demonstration, Actor cues, Verbal cues, and Handouts Education comprehension: verbalized understanding, returned demonstration, verbal cues required, tactile cues required, and needs further education  HOME EXERCISE PROGRAM: 06/12/23 Access Code: [email or web address] URL: [email or web address] Date: 06/12/2023 Prepared by: Eulis Foster  Exercises - Supine Pelvic Floor Contraction  - 3 x daily - 7 x weekly - 1 sets - 4 reps - 40 sec hold  ASSESSMENT:  CLINICAL IMPRESSION: Patient is a 87 y.o. female who was seen today for physical therapy evaluation and treatment for urinary and fecal incontinence. Patient reports she has had urinary and fecal leakage for 5 years. She will leak stool and not feel it Puborectalis strength is 3/5 and sphincter strength is 2/5 holding 20 sec. Patient will leak urine with  urge to void, walking to the bathroom, coughing, sneezing, laughing, and sit to stand. She wears 2 pads per day. She will urinate 3 times at night. Pelvic floor strength vaginally is 2/5. She will shuffle her feet as she walks to the therapy room to be evaluated. Patient will bulge the lower abdomen with abdominal contraction. She has weakness in her hips. Patient will benefit from skilled therapy to improve pelvic floor coordination and strength to reduce her leakage.   OBJECTIVE IMPAIRMENTS: decreased mobility, decreased strength, and increased fascial restrictions.    ACTIVITY LIMITATIONS: continence and toileting  PARTICIPATION LIMITATIONS: community activity  PERSONAL FACTORS: Age, Time since onset of injury/illness/exacerbation, and 1-2 comorbidities: Hypertension; Diverticulosis;;Cholecystectomy;Vaginal hysterectomy; Reverse left shoulder arthroplasty; Right total knee arthroplasty  are also affecting patient's functional outcome.   REHAB POTENTIAL: Excellent  CLINICAL DECISION MAKING: Evolving/moderate complexity  EVALUATION COMPLEXITY: Moderate   GOALS: Goals reviewed with patient? Yes  SHORT TERM GOALS: Target date: 07/12/23  Patient independent with initial HEP for pelvic floor contraction.  Baseline: Goal status: INITIAL  2.  Patient is able to contract the lower abdomen instead of bulging to reduce pressure on the bladder.  Baseline:  Goal status: INITIAL  3.  Patient has increased awareness of stool leakage due to improve contraction of the rectum.  Baseline:  Goal status: INITIAL  4.  Patient educated on behavioral techniques to reduce the urge to void.  Baseline:  Goal status: INITIAL   LONG TERM GOALS: Target date: 08/07/23  Patient independent with advanced HEP for core and pelvic floor strength to reduce leakage.  Baseline:  Goal status: INITIAL  2.  Patient is able to deter the urge to urinate so she is able to walk to the bathroom without urinary leakage using her behavioral techniques.  Baseline:  Goal status: INITIAL  3.  Patient reports 0-1 episode of fecal leakage within 3 weeks due to increased in rectal strength >/= 3/5 holding for 40 seconds.  Baseline:  Goal status: INITIAL  4.  Patient urinates </= 1-2 times per night instead of 3 due to increased in vaginal strength >/= 3/5.  Baseline:  Goal status: INITIAL  5.  Patient is able to go from sit to stand without leaking due to increased strength and core strength.  Baseline:  Goal status: INITIAL  6.  Patient is able to cough and sneeze with  minimal to no urinary leakage due to improved quick contraction of the pelvic floor with correct contraction of the lower abdominals.  Baseline:  Goal status: INITIAL  PLAN:  PT FREQUENCY: 1-2x/week  PT DURATION: 8 weeks  PLANNED INTERVENTIONS: Therapeutic exercises, Therapeutic activity, Neuromuscular re-education, Patient/Family education, Dry  Needling, Electrical stimulation, Cryotherapy, Moist heat, Ultrasound, Biofeedback, and Manual therapy  PLAN FOR NEXT SESSION: working on pelvic floor quick contractions, abdominal work to improve diaphragmatic breathing and lower abdominal contraction; urge to void   Eulis Foster, PT 06/13/23 9:16 AM

## 2023-06-25 ENCOUNTER — Ambulatory Visit: Payer: Medicare PPO | Admitting: Obstetrics and Gynecology

## 2023-06-30 ENCOUNTER — Ambulatory Visit: Payer: Medicare PPO | Attending: Obstetrics and Gynecology | Admitting: Physical Therapy

## 2023-06-30 DIAGNOSIS — R152 Fecal urgency: Secondary | ICD-10-CM | POA: Diagnosis not present

## 2023-06-30 DIAGNOSIS — N3281 Overactive bladder: Secondary | ICD-10-CM | POA: Diagnosis not present

## 2023-06-30 DIAGNOSIS — R3915 Urgency of urination: Secondary | ICD-10-CM | POA: Diagnosis not present

## 2023-06-30 DIAGNOSIS — M6281 Muscle weakness (generalized): Secondary | ICD-10-CM

## 2023-06-30 DIAGNOSIS — R159 Full incontinence of feces: Secondary | ICD-10-CM | POA: Diagnosis not present

## 2023-06-30 DIAGNOSIS — R350 Frequency of micturition: Secondary | ICD-10-CM | POA: Insufficient documentation

## 2023-06-30 DIAGNOSIS — R278 Other lack of coordination: Secondary | ICD-10-CM
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
## 2023-07-06 ENCOUNTER — Ambulatory Visit: Payer: Medicare PPO | Admitting: Physical Therapy

## 2023-07-06 DIAGNOSIS — R3915 Urgency of urination: Secondary | ICD-10-CM | POA: Diagnosis not present

## 2023-07-06 DIAGNOSIS — R278 Other lack of coordination: Secondary | ICD-10-CM

## 2023-07-06 DIAGNOSIS — R350 Frequency of micturition: Secondary | ICD-10-CM | POA: Diagnosis not present

## 2023-07-06 DIAGNOSIS — M6281 Muscle weakness (generalized): Secondary | ICD-10-CM | POA: Diagnosis not present

## 2023-07-06 DIAGNOSIS — R159 Full incontinence of feces: Secondary | ICD-10-CM | POA: Diagnosis not present

## 2023-07-06 DIAGNOSIS — N3281 Overactive bladder: Secondary | ICD-10-CM | POA: Diagnosis not present

## 2023-07-06 DIAGNOSIS — R152 Fecal urgency: Secondary | ICD-10-CM | POA: Diagnosis not present
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
## 2023-07-07 DIAGNOSIS — C44622 Squamous cell carcinoma of skin of right upper limb, including shoulder: Secondary | ICD-10-CM | POA: Diagnosis not present

## 2023-07-13 ENCOUNTER — Ambulatory Visit: Payer: Medicare PPO | Admitting: Physical Therapy

## 2023-07-13 DIAGNOSIS — M6281 Muscle weakness (generalized): Secondary | ICD-10-CM | POA: Diagnosis not present

## 2023-07-13 DIAGNOSIS — R152 Fecal urgency: Secondary | ICD-10-CM | POA: Diagnosis not present

## 2023-07-13 DIAGNOSIS — R350 Frequency of micturition: Secondary | ICD-10-CM | POA: Diagnosis not present

## 2023-07-13 DIAGNOSIS — N3281 Overactive bladder: Secondary | ICD-10-CM | POA: Diagnosis not present

## 2023-07-13 DIAGNOSIS — R278 Other lack of coordination: Secondary | ICD-10-CM

## 2023-07-13 DIAGNOSIS — R159 Full incontinence of feces: Secondary | ICD-10-CM | POA: Diagnosis not present

## 2023-07-13 DIAGNOSIS — R3915 Urgency of urination: Secondary | ICD-10-CM | POA: Diagnosis not present

## 2023-07-13 NOTE — Patient Instructions (Signed)

## 2023-07-13 NOTE — Therapy (Signed)
OUTPATIENT PHYSICAL THERAPY FEMALE PELVIC EVALUATION   Patient Name: Angela Barber MRN: [id number] DOB:[birth year], 87 y.o., female Today's Date: 07/14/2023  END OF SESSION:  PT End of Session - 07/13/23 1217     Visit Number 4    Date for PT Re-Evaluation 08/07/23    Authorization Type [employer]    Authorization Time Period Cohere approved 8 visits 06/12/2023 - 08/07/2023 [id number]  [employer] MCR 2024    PT Start Time 1150    PT Stop Time 1230    PT Time Calculation (min) 40 min    Activity Tolerance Patient tolerated treatment well    Behavior During Therapy [facility] for tasks assessed/performed                Past Medical History:  Diagnosis Date   Anxiety    C. difficile diarrhea 05/23/2005   Cochlear implant status    Depression    Diverticulosis    Family history of adverse reaction to anesthesia    sister had cognitive decline after surgery   GERD (gastroesophageal reflux disease)    occasional   Hearing loss of both ears    HLD (hyperlipidemia)    Hyperplastic colon polyp    Hypertension    Insomnia    Internal hemorrhoid    Osteoarthritis, knee    Past Surgical History:  Procedure Laterality Date   bilateral cataract surgery      with IOL   BUNIONECTOMY     right   CHOLECYSTECTOMY N/A 07/25/2016   Procedure: LAPAROSCOPIC CHOLECYSTECTOMY;  Surgeon: Avel Peace, MD;  Location: [facility];  Service: General;  Laterality: N/A;   COCHLEAR IMPLANT  03/22/2012   right ear. Dr. Lenoria Farrier   ERCP N/A 07/31/2016   Procedure: ENDOSCOPIC RETROGRADE CHOLANGIOPANCREATOGRAPHY (ERCP);  Surgeon: Rachael Fee, MD;  Location: [facility];  Service: Endoscopy;  Laterality: N/A;   REVERSE SHOULDER ARTHROPLASTY Left 10/30/2022   Procedure: REVERSE SHOULDER ARTHROPLASTY;  Surgeon: Francena Hanly, MD;  Location: [facility];  Service: Orthopedics;  Laterality: Left;    ROTATOR CUFF REPAIR Left 09/22/2010   ROTATOR CUFF REPAIR Right 2007 & 2009   TONSILLECTOMY     TOTAL  KNEE ARTHROPLASTY Right 05/20/2021   Procedure: TOTAL KNEE ARTHROPLASTY;  Surgeon: Ollen Gross, MD;  Location: [facility];  Service: Orthopedics;  Laterality: Right;   VAGINAL HYSTERECTOMY     Patient Active Problem List   Diagnosis Date Noted   Age-related osteoporosis without current pathological fracture 02/27/2023   Cochlear implant in place 02/26/2023   Rash 08/05/2022   Diplopia 07/18/2022   BPPV (benign paroxysmal positional vertigo) 01/07/2022   OA (osteoarthritis) of knee 05/20/2021   Pre-operative cardiovascular examination 05/17/2021   Bilateral shoulder pain 06/29/2020   Chronic diarrhea 06/28/2020   Primary osteoarthritis of both knees 01/06/2020   Urinary incontinence 10/14/2017   Irregular heart beat 08/09/2016   Symptomatic varicose veins of both lower extremities 07/25/2015   Routine general medical examination at a health care facility 08/13/2014   Insomnia due to psychological stress 04/04/2014   Hyperlipidemia 05/24/2009   Hearing loss 04/25/2008   CLOSTRIDIUM DIFFICILE COLITIS, HX OF 10/23/2007    PCP: none  REFERRING PROVIDER: Selmer Dominion, NP   REFERRING DIAG:  N32.81 (ICD-10-CM) - OAB (overactive bladder)  R39.15 (ICD-10-CM) - Urinary urgency  R35.0 (ICD-10-CM) - Urinary frequency  R15.9,R15.2 (ICD-10-CM) - Incontinence of feces with fecal urgency    THERAPY DIAG:  Muscle weakness (generalized)  Other lack of coordination  Rationale for  Evaluation and Treatment: Rehabilitation  ONSET DATE: 2024  SUBJECTIVE:   07/14/23   Pt reports that things are going slowly, she had a right wrist surgery last week. 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 Lt shoulder hurts a lot too. Shoulder was replaced, she reports that she feels strongly that something is going on with that. Some exercises are aggravating.     Pt reports that she has not been doing her exercises, has not seen any changes. She reports that when she takes the medication ( cholestramine) every day  after about a week of not missing any, the stools are hard, last week, she did not have a BM for 2 days. When she has a BM, it's lose. She had no warning that she is going to have a BM the other day.       Pt reports that she really does not want to do this today, she had a really rough day. Reports that she is not feeling too good today, burned her head with a curling iron today. When someone speaks, they need to slow down for [name] to understand. Pt reports that her  urinary and fecal leakage has actually been going on for a while- several years ago. She suddenly had the inability to control fecal leakage, did not happen too often. Gallbladder removal was real ordeal- she was at [facility] 12 days.  Constipation is rare, but she has been constipated the last week.                                                                                                                                                                                   SUBJECTIVE STATEMENT: Sudden onset of urinary and fecal leakage.  Fluid intake: Yes: water, hot tea at night, Bao    PAIN:  Are you having pain? No  PRECAUTIONS: None  RED FLAGS: None   WEIGHT BEARING RESTRICTIONS: No  FALLS:  Has patient fallen in last 6 months? No  LIVING ENVIRONMENT: Lives with: lives alone   OCCUPATION: retired  PLOF: Independent  PATIENT GOALS: regaining control of fecal and urinary leakage  PERTINENT HISTORY:  Hypertension; Diverticulosis;;Cholecystectomy;Vaginal hysterectomy; Reverse left shoulder arthroplasty; Right total knee arthroplasty  BOWEL MOVEMENT: Pain with bowel movement: No Type of bowel movement:Type (Bristol Stool Scale) without medication is Type 1-4, Frequency 2-3, and Strain No Fully empty rectum: Yes:   Leakage: Yes: no warning but feels it come out Fiber supplement: No  URINATION: Pain with urination: No Fully empty bladder: Yes:   Stream: Strong Urgency: Yes:   Frequency: Day time voids 4.   Nocturia: 3 times per night to void.  Leakage: Urge to void, Walking to the  bathroom, Coughing, Sneezing, Laughing, and sit to stand Pads: Yes: 2 pads  INTERCOURSE: not active  PREGNANCY: Vaginal deliveries 2 Tearing Yes:     PROLAPSE: None   OBJECTIVE:   DIAGNOSTIC FINDINGS:  Pelvic floor strength I/V  Pelvic floor musculature: Right levator non-tender, Right obturator tender, Left levator tender, Left obturator tender  PVR of 25 ml was obtained by bladder scan  PATIENT SURVEYS:  [id number]  COGNITION: Overall cognitive status: Within functional limits for tasks assessed     SENSATION: Light touch: Appears intact Proprioception: Appears intact   GAIT: Assistive device utilized: None Level of assistance: Complete Independence Comments: shuffles feet  POSTURE: rounded shoulders and forward head  PELVIC ALIGNMENT:ASIS are equal    LOWER EXTREMITY ROM: Bilateral hip ROM is full   LOWER EXTREMITY MMT:  MMT Right eval Left eval  Hip extension 4/5 4/5  Hip abduction 4/5 4/5  Hip adduction 4/5 4/5   PALPATION:   General  Patient will bulge the lower abdomen with abdominal contraction                External Perineal Exam intact                             Internal Pelvic Floor low tone  Patient confirms identification and approves PT to assess internal pelvic floor and treatment Yes  PELVIC MMT:   MMT eval  Vaginal 2/5  Internal Anal Sphincter 2/5 holding 20 sec  External Anal Sphincter 2/5  Puborectalis 3/5  (Blank rows = not tested)        TONE: low   TODAY'S TREATMENT:                                                                                                                              DATE: 07/14/23    Therapeutic exercises-STS with pelvic floor contraction , hip adduction with ball, supine and seated, bridging    Therapeutic activities- urge suppression strategies, bladder irritants eduction   Neuromuscular re-education:  ball press seated with TRA breath,  Horizontal abduction with thera band with TRA breath Rowing with TRA breath, STS with pelvic contraction with TRA breath, hip adduction with ball with TRA breath     Access code: [email or web address]  PATIENT EDUCATION:  06/12/23 Education details: Access Code: [email or web address] Person educated: Patient Education method: Programmer, multimedia, Facilities manager, Actor cues, Verbal cues, and Handouts Education comprehension: verbalized understanding, returned demonstration, verbal cues required, tactile cues required, and needs further education  HOME EXERCISE PROGRAM: 06/12/23 Access Code: [email or web address] URL: [email or web address] Date: 06/12/2023 Prepared by: Eulis Foster  Exercises - Supine Pelvic Floor Contraction  - 3 x daily - 7 x weekly - 1 sets - 4 reps - 40 sec hold  ASSESSMENT:  CLINICAL IMPRESSION:  07/14/23  Pt educated today on bladder irritants and urge suppression strategies, printouts given. Pt progressing slowly. Having difficulty being compliant with HEP and  understanding some exercises. Will continue to benefit from PT to achieve goals.     Pt did better then last week with her exercises, she was able to coordinate her breath a little better with her pelvic floor and abdomen, however still had poor coordination and difficulty understanding the exercises and got frustrated sone. She needs VC's and TC's. Presents with shallow breathing and HOH which is slowing her progress.    Pt did fairly well today with her new exercises, she had difficulty engaging her pelvic floor and abdomen and was easily frustrated. She was able to engage better with more pronounced breath but she seems skeptical that these exercises will help her. Educated patient on HEP, given her thera band and encouraged her to be consistent. Handout given   EVAL: Patient is a 87 y.o. female who was seen today for physical therapy evaluation and treatment for urinary and fecal incontinence.  Patient reports she has had urinary and fecal leakage for 5 years. She will leak stool and not feel it Puborectalis strength is 3/5 and sphincter strength is 2/5 holding 20 sec. Patient will leak urine with  urge to void, walking to the bathroom, coughing, sneezing, laughing, and sit to stand. She wears 2 pads per day. She will urinate 3 times at night. Pelvic floor strength vaginally is 2/5. She will shuffle her feet as she walks to the therapy room to be evaluated. Patient will bulge the lower abdomen with abdominal contraction. She has weakness in her hips. Patient will benefit from skilled therapy to improve pelvic floor coordination and strength to reduce her leakage.   OBJECTIVE IMPAIRMENTS: decreased mobility, decreased strength, and increased fascial restrictions.   ACTIVITY LIMITATIONS: continence and toileting  PARTICIPATION LIMITATIONS: community activity  PERSONAL FACTORS: Age, Time since onset of injury/illness/exacerbation, and 1-2 comorbidities: Hypertension; Diverticulosis;;Cholecystectomy;Vaginal hysterectomy; Reverse left shoulder arthroplasty; Right total knee arthroplasty  are also affecting patient's functional outcome.   REHAB POTENTIAL: Excellent  CLINICAL DECISION MAKING: Evolving/moderate complexity  EVALUATION COMPLEXITY: Moderate   GOALS: Goals reviewed with patient? Yes  SHORT TERM GOALS: Target date: 07/12/23  Patient independent with initial HEP for pelvic floor contraction.  Baseline: Goal status: INITIAL  2.  Patient is able to contract the lower abdomen instead of bulging to reduce pressure on the bladder.  Baseline:  Goal status: INITIAL  3.  Patient has increased awareness of stool leakage due to improve contraction of the rectum.  Baseline:  Goal status: INITIAL  4.  Patient educated on behavioral techniques to reduce the urge to void.  Baseline:  Goal status: INITIAL   LONG TERM GOALS: Target date: 08/07/23  Patient independent with  advanced HEP for core and pelvic floor strength to reduce leakage.  Baseline:  Goal status: INITIAL  2.  Patient is able to deter the urge to urinate so she is able to walk to the bathroom without urinary leakage using her behavioral techniques.  Baseline:  Goal status: INITIAL  3.  Patient reports 0-1 episode of fecal leakage within 3 weeks due to increased in rectal strength >/= 3/5 holding for 40 seconds.  Baseline:  Goal status: INITIAL  4.  Patient urinates </= 1-2 times per night instead of 3 due to increased in vaginal strength >/= 3/5.  Baseline:  Goal status: INITIAL  5.  Patient is able to go from sit to stand without leaking due to increased strength and core strength.  Baseline:  Goal status: INITIAL  6.  Patient is able to cough  and sneeze with minimal to no urinary leakage due to improved quick contraction of the pelvic floor with correct contraction of the lower abdominals.  Baseline:  Goal status: INITIAL  PLAN:  PT FREQUENCY: 1-2x/week  PT DURATION: 8 weeks  PLANNED INTERVENTIONS: Therapeutic exercises, Therapeutic activity, Neuromuscular re-education, Patient/Family education, Dry Needling, Electrical stimulation, Cryotherapy, Moist heat, Ultrasound, Biofeedback, and Manual therapy  PLAN FOR NEXT SESSION: working on pelvic floor quick contractions, abdominal work to improve diaphragmatic breathing and lower abdominal contraction; urge to void   Kirstan Fentress, PT 07/14/23 8:47 AM

## 2023-07-17 ENCOUNTER — Ambulatory Visit: Payer: Medicare PPO | Admitting: Physical Therapy

## 2023-07-20 ENCOUNTER — Encounter: Payer: Medicare PPO | Admitting: Physical Therapy

## 2023-07-24 ENCOUNTER — Encounter: Payer: Medicare PPO | Admitting: Physical Therapy

## 2023-07-27 ENCOUNTER — Ambulatory Visit: Payer: Medicare PPO | Attending: Obstetrics and Gynecology | Admitting: Physical Therapy

## 2023-07-27 DIAGNOSIS — M6281 Muscle weakness (generalized): Secondary | ICD-10-CM | POA: Insufficient documentation

## 2023-07-27 DIAGNOSIS — R278 Other lack of coordination: Secondary | ICD-10-CM | POA: Diagnosis not present

## 2023-07-27 NOTE — Therapy (Addendum)
OUTPATIENT PHYSICAL THERAPY FEMALE PELVIC TREATMENT/ LATE DISCHARGE SUMMARY   Patient Name: Angela Barber MRN: [id number] DOB:04-14-1936, 87 y.o., female Today's Date: 07/27/2023  END OF SESSION:  PT End of Session - 07/27/23 1207     Visit Number 5    Date for PT Re-Evaluation 08/07/23    Authorization Type [employer]    Authorization Time Period [place] approved 8 visits 06/12/2023 - 08/07/2023 [id number]  [employer] MCR 2024    PT Start Time 1150    PT Stop Time 1230    PT Time Calculation (min) 40 min    Activity Tolerance Patient tolerated treatment well    Behavior During Therapy [facility] for tasks assessed/performed                 Past Medical History:  Diagnosis Date   Anxiety    C. difficile diarrhea 05/23/2005   Cochlear implant status    Depression    Diverticulosis    Family history of adverse reaction to anesthesia    sister had cognitive decline after surgery   GERD (gastroesophageal reflux disease)    occasional   Hearing loss of both ears    HLD (hyperlipidemia)    Hyperplastic colon polyp    Hypertension    Insomnia    Internal hemorrhoid    Osteoarthritis, knee    Past Surgical History:  Procedure Laterality Date   bilateral cataract surgery      with IOL   BUNIONECTOMY     right   CHOLECYSTECTOMY N/A 07/25/2016   Procedure: LAPAROSCOPIC CHOLECYSTECTOMY;  Surgeon: Avel Peace, MD;  Location: [facility];  Service: General;  Laterality: N/A;   COCHLEAR IMPLANT  03/22/2012   right ear. Dr. Lenoria Farrier   ERCP N/A 07/31/2016   Procedure: ENDOSCOPIC RETROGRADE CHOLANGIOPANCREATOGRAPHY (ERCP);  Surgeon: Rachael Fee, MD;  Location: [facility];  Service: Endoscopy;  Laterality: N/A;   REVERSE SHOULDER ARTHROPLASTY Left 10/30/2022   Procedure: REVERSE SHOULDER ARTHROPLASTY;  Surgeon: Francena Hanly, MD;  Location: [facility];  Service: Orthopedics;  Laterality: Left;    ROTATOR CUFF REPAIR Left 09/22/2010   ROTATOR CUFF REPAIR Right 2007 & 2009    TONSILLECTOMY     TOTAL KNEE ARTHROPLASTY Right 05/20/2021   Procedure: TOTAL KNEE ARTHROPLASTY;  Surgeon: Ollen Gross, MD;  Location: [facility];  Service: Orthopedics;  Laterality: Right;   VAGINAL HYSTERECTOMY     Patient Active Problem List   Diagnosis Date Noted   Age-related osteoporosis without current pathological fracture 02/27/2023   Cochlear implant in place 02/26/2023   Rash 08/05/2022   Diplopia 07/18/2022   BPPV (benign paroxysmal positional vertigo) 01/07/2022   OA (osteoarthritis) of knee 05/20/2021   Pre-operative cardiovascular examination 05/17/2021   Bilateral shoulder pain 06/29/2020   Chronic diarrhea 06/28/2020   Primary osteoarthritis of both knees 01/06/2020   Urinary incontinence 10/14/2017   Irregular heart beat 08/09/2016   Symptomatic varicose veins of both lower extremities 07/25/2015   Routine general medical examination at a health care facility 08/13/2014   Insomnia due to psychological stress 04/04/2014   Hyperlipidemia 05/24/2009   Hearing loss 04/25/2008   CLOSTRIDIUM DIFFICILE COLITIS, HX OF 10/23/2007    PCP: none  REFERRING PROVIDER: Selmer Dominion, NP   REFERRING DIAG:  N32.81 (ICD-10-CM) - OAB (overactive bladder)  R39.15 (ICD-10-CM) - Urinary urgency  R35.0 (ICD-10-CM) - Urinary frequency  R15.9,R15.2 (ICD-10-CM) - Incontinence of feces with fecal urgency    THERAPY DIAG:  Muscle weakness (generalized)  Other lack of  coordination  Rationale for Evaluation and Treatment: Rehabilitation  ONSET DATE: 2024  SUBJECTIVE:   07/27/23   Pt reports that things are about the same, maybe some some improvement, has not not been doing her exercises as much as she should. Left shoulder was aggravated yesterday.  Having trouble getting into exercises, lots of things happening in her life that she is prioritizing. She would rather postpone PT until after her trip to [place]. Her shoulders are bothering her sometimes. Still has a lot of  urgency.  She reports that chlorestamine helps control stool leakage but she has time maybe once in 2 weeks when she has stool leakage. Sometimes she does not have movement.       Pt reports that things are going slowly, she had a right wrist surgery last week. 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 Lt shoulder hurts a lot too. Shoulder was replaced, she reports that she feels strongly that something is going on with that. Some exercises are aggravating.     Pt reports that she has not been doing her exercises, has not seen any changes. She reports that when she takes the medication ( cholestramine) every day after about a week of not missing any, the stools are hard, last week, she did not have a BM for 2 days. When she has a BM, it's lose. She had no warning that she is going to have a BM the other day.       Pt reports that she really does not want to do this today, she had a really rough day. Reports that she is not feeling too good today, burned her head with a curling iron today. When someone speaks, they need to slow down for Jacqueline to understand. Pt reports that her  urinary and fecal leakage has actually been going on for a while- several years ago. She suddenly had the inability to control fecal leakage, did not happen too often. Gallbladder removal was real ordeal- she was at [facility] days.  Constipation is rare, but she has been constipated the last week.                                                                                                                                                                                   SUBJECTIVE STATEMENT: Sudden onset of urinary and fecal leakage.  Fluid intake: Yes: water, hot tea at night, Bao    PAIN:  Are you having pain? No  PRECAUTIONS: None  RED FLAGS: None   WEIGHT BEARING RESTRICTIONS: No  FALLS:  Has patient fallen in last 6 months? No  LIVING ENVIRONMENT: Lives with: lives alone   OCCUPATION: retired  PLOF:  Independent  PATIENT GOALS: regaining control of fecal and urinary leakage  PERTINENT HISTORY:  Hypertension; Diverticulosis;;Cholecystectomy;Vaginal hysterectomy; Reverse left shoulder arthroplasty; Right total knee arthroplasty  BOWEL MOVEMENT: Pain with bowel movement: No Type of bowel movement:Type (Bristol Stool Scale) without medication is Type 1-4, Frequency 2-3, and Strain No Fully empty rectum: Yes:   Leakage: Yes: no warning but feels it come out Fiber supplement: No  URINATION: Pain with urination: No Fully empty bladder: Yes:   Stream: Strong Urgency: Yes:   Frequency: Day time voids 4.  Nocturia: 3 times per night to void.  Leakage: Urge to void, Walking to the bathroom, Coughing, Sneezing, Laughing, and sit to stand Pads: Yes: 2 pads  INTERCOURSE: not active  PREGNANCY: Vaginal deliveries 2 Tearing Yes:     PROLAPSE: None   OBJECTIVE:   DIAGNOSTIC FINDINGS:  Pelvic floor strength I/V  Pelvic floor musculature: Right levator non-tender, Right obturator tender, Left levator tender, Left obturator tender  PVR of 25 ml was obtained by bladder scan  PATIENT SURVEYS:  [id number]  COGNITION: Overall cognitive status: Within functional limits for tasks assessed     SENSATION: Light touch: Appears intact Proprioception: Appears intact   GAIT: Assistive device utilized: None Level of assistance: Complete Independence Comments: shuffles feet  POSTURE: rounded shoulders and forward head  PELVIC ALIGNMENT:ASIS are equal    LOWER EXTREMITY ROM: Bilateral hip ROM is full   LOWER EXTREMITY MMT:  MMT Right eval Left eval  Hip extension 4/5 4/5  Hip abduction 4/5 4/5  Hip adduction 4/5 4/5   PALPATION:   General  Patient will bulge the lower abdomen with abdominal contraction                External Perineal Exam intact                             Internal Pelvic Floor low tone  Patient confirms identification and approves PT to assess  internal pelvic floor and treatment Yes  PELVIC MMT:   MMT eval  Vaginal 2/5  Internal Anal Sphincter 2/5 holding 20 sec  External Anal Sphincter 2/5  Puborectalis 3/5  (Blank rows = not tested)        TONE: low   TODAY'S TREATMENT:                                                                                                                              DATE: 07/27/23      Neuro reed- ball press seated with TRA breath, bridge with TRA breath- difficulty with coordinating exhale and contraction        Therapeutic activities- urge suppression drill and education     Therapeutic exercises-STS with pelvic floor contraction , hip adduction with ball, supine and seated, bridging    Therapeutic activities- urge suppression strategies, bladder irritants education   Neuromuscular re-education: ball press seated with TRA  breath,  Horizontal abduction with thera band with TRA breath Rowing with TRA breath, STS with pelvic contraction with TRA breath, hip adduction with ball with TRA breath     Access code: [email or web address]  PATIENT EDUCATION:  06/12/23 Education details: Access Code: [email or web address] Person educated: Patient Education method: Programmer, multimedia, Demonstration, Actor cues, Verbal cues, and Handouts Education comprehension: verbalized understanding, returned demonstration, verbal cues required, tactile cues required, and needs further education  HOME EXERCISE PROGRAM: 06/12/23 Access Code: [email or web address] URL: [email or web address] Date: 06/12/2023 Prepared by: Angela Barber  Exercises - Supine Pelvic Floor Contraction  - 3 x daily - 7 x weekly - 1 sets - 4 reps - 40 sec hold  ASSESSMENT:  CLINICAL IMPRESSION:  07/27/23  Tx focus- urge drill and exercises. She is limited by shoulder pain, comprehension, age, hearing difficulties and motivation. Reported no real changes. Wears one pad/ day. Gets up once- 3 times/ night. Will resume PT after her trip to  [place]. Pt will be discharged today.   Pt educated today on bladder irritants and urge suppression strategies, printouts given. Pt progressing slowly. Having difficulty being compliant with HEP and understanding some exercises. Will continue to benefit from PT to achieve goals.     Pt did better then last week with her exercises, she was able to coordinate her breath a little better with her pelvic floor and abdomen, however still had poor coordination and difficulty understanding the exercises and got frustrated sone. She needs VC's and TC's. Presents with shallow breathing and HOH which is slowing her progress.    Pt did fairly well today with her new exercises, she had difficulty engaging her pelvic floor and abdomen and was easily frustrated. She was able to engage better with more pronounced breath but she seems skeptical that these exercises will help her. Educated patient on HEP, given her thera band and encouraged her to be consistent. Handout given   EVAL: Patient is a 87 y.o. female who was seen today for physical therapy evaluation and treatment for urinary and fecal incontinence. Patient reports she has had urinary and fecal leakage for 5 years. She will leak stool and not feel it Puborectalis strength is 3/5 and sphincter strength is 2/5 holding 20 sec. Patient will leak urine with  urge to void, walking to the bathroom, coughing, sneezing, laughing, and sit to stand. She wears 2 pads per day. She will urinate 3 times at night. Pelvic floor strength vaginally is 2/5. She will shuffle her feet as she walks to the therapy room to be evaluated. Patient will bulge the lower abdomen with abdominal contraction. She has weakness in her hips. Patient will benefit from skilled therapy to improve pelvic floor coordination and strength to reduce her leakage.   OBJECTIVE IMPAIRMENTS: decreased mobility, decreased strength, and increased fascial restrictions.   ACTIVITY LIMITATIONS: continence and  toileting  PARTICIPATION LIMITATIONS: community activity  PERSONAL FACTORS: Age, Time since onset of injury/illness/exacerbation, and 1-2 comorbidities: Hypertension; Diverticulosis;;Cholecystectomy;Vaginal hysterectomy; Reverse left shoulder arthroplasty; Right total knee arthroplasty  are also affecting patient's functional outcome.   REHAB POTENTIAL: Excellent  CLINICAL DECISION MAKING: Evolving/moderate complexity  EVALUATION COMPLEXITY: Moderate   GOALS: Goals reviewed with patient? Yes  SHORT TERM GOALS: Target date: 07/12/23  Patient independent with initial HEP for pelvic floor contraction.  Baseline: Goal status: progressing  2.  Patient is able to contract the lower abdomen instead of bulging to reduce pressure on the bladder.  Baseline:  Goal status: progressing  3.  Patient has increased  awareness of stool leakage due to improve contraction of the rectum.  Baseline:  Goal status: progressing  4.  Patient educated on behavioral techniques to reduce the urge to void.  Baseline:  Goal status: progressing  LONG TERM GOALS: Target date: 08/07/23  Patient independent with advanced HEP for core and pelvic floor strength to reduce leakage.  Baseline:  Goal status: not met 2.  Patient is able to deter the urge to urinate so she is able to walk to the bathroom without urinary leakage using her behavioral techniques.  Baseline:  Goal status: not met  3.  Patient reports 0-1 episode of fecal leakage within 3 weeks due to increased in rectal strength >/= 3/5 holding for 40 seconds.  Baseline:  Goal status: not met  4.  Patient urinates </= 1-2 times per night instead of 3 due to increased in vaginal strength >/= 3/5.  Baseline:  Goal status: not met  5.  Patient is able to go from sit to stand without leaking due to increased strength and core strength.  Baseline:  Goal status: not met  6.  Patient is able to cough and sneeze with minimal to no urinary leakage  due to improved quick contraction of the pelvic floor with correct contraction of the lower abdominals.  Baseline:  Goal status: not met  PLAN:  PT FREQUENCY: 1-2x/week  PT DURATION: 8 weeks  PLANNED INTERVENTIONS: Therapeutic exercises, Therapeutic activity, Neuromuscular re-education, Patient/Family education, Dry Needling, Electrical stimulation, Cryotherapy, Moist heat, Ultrasound, Biofeedback, and Manual therapy  PLAN FOR NEXT SESSION: discharge PT  Angela Barber, PT 07/27/23 12:38 PM   PHYSICAL THERAPY DISCHARGE SUMMARY 10/22/2023   Patient agrees to discharge. Patient goals were not met. Patient is being discharged due to not returning since the last visit.  Angela Barber, PT 10/22/23 8:30 AM

## 2023-07-31 ENCOUNTER — Encounter: Payer: Medicare PPO | Admitting: Physical Therapy

## 2023-08-03 ENCOUNTER — Encounter: Payer: Medicare PPO | Admitting: Physical Therapy

## 2023-08-06 ENCOUNTER — Ambulatory Visit: Payer: Medicare PPO | Admitting: Obstetrics and Gynecology

## 2023-08-07 ENCOUNTER — Encounter: Payer: Medicare PPO | Admitting: Physical Therapy

## 2023-08-10 ENCOUNTER — Encounter: Payer: Medicare PPO | Admitting: Physical Therapy

## 2023-08-28 DIAGNOSIS — Z1231 Encounter for screening mammogram for malignant neoplasm of breast: Secondary | ICD-10-CM | POA: Diagnosis not present

## 2023-08-28 LAB — HM MAMMOGRAPHY

## 2023-10-07 DIAGNOSIS — M25511 Pain in right shoulder: Secondary | ICD-10-CM | POA: Diagnosis not present

## 2023-10-07 DIAGNOSIS — Z471 Aftercare following joint replacement surgery: Secondary | ICD-10-CM | POA: Diagnosis not present

## 2023-10-07 DIAGNOSIS — Z96612 Presence of left artificial shoulder joint: Secondary | ICD-10-CM | POA: Diagnosis not present

## 2023-10-12 ENCOUNTER — Other Ambulatory Visit (HOSPITAL_COMMUNITY): Payer: Self-pay | Admitting: Orthopedic Surgery

## 2023-10-12 DIAGNOSIS — Z96612 Presence of left artificial shoulder joint: Secondary | ICD-10-CM

## 2023-10-15 DIAGNOSIS — I70203 Unspecified atherosclerosis of native arteries of extremities, bilateral legs: Secondary | ICD-10-CM | POA: Diagnosis not present

## 2023-10-15 DIAGNOSIS — M2011 Hallux valgus (acquired), right foot: Secondary | ICD-10-CM | POA: Diagnosis not present

## 2023-10-15 DIAGNOSIS — M2012 Hallux valgus (acquired), left foot: Secondary | ICD-10-CM | POA: Diagnosis not present

## 2023-10-15 DIAGNOSIS — M2041 Other hammer toe(s) (acquired), right foot: Secondary | ICD-10-CM | POA: Diagnosis not present

## 2023-10-15 DIAGNOSIS — L03032 Cellulitis of left toe: Secondary | ICD-10-CM | POA: Diagnosis not present

## 2023-10-15 DIAGNOSIS — M2042 Other hammer toe(s) (acquired), left foot: Secondary | ICD-10-CM | POA: Diagnosis not present

## 2023-10-15 DIAGNOSIS — L02612 Cutaneous abscess of left foot: Secondary | ICD-10-CM | POA: Diagnosis not present

## 2023-10-19 ENCOUNTER — Encounter (HOSPITAL_COMMUNITY)
Admission: RE | Admit: 2023-10-19 | Discharge: 2023-10-19 | Disposition: A | Discharge status: Home / Self Care | Payer: Medicare PPO | Source: Ambulatory Visit | Attending: Orthopedic Surgery | Admitting: Orthopedic Surgery

## 2023-10-19 DIAGNOSIS — Z96612 Presence of left artificial shoulder joint: Secondary | ICD-10-CM | POA: Insufficient documentation

## 2023-10-19 DIAGNOSIS — M25512 Pain in left shoulder: Secondary | ICD-10-CM | POA: Diagnosis not present

## 2023-10-19 MED ORDER — TECHNETIUM TC 99M MEDRONATE IV KIT
20.3000 | PACK | Freq: Once | INTRAVENOUS | Status: AC | PRN
  Administered 2023-10-19: 20.3 via INTRAVENOUS

## 2023-10-20 ENCOUNTER — Ambulatory Visit: Payer: Medicare PPO | Admitting: Internal Medicine

## 2023-10-28 ENCOUNTER — Encounter: Payer: Self-pay | Admitting: Internal Medicine

## 2023-10-28 ENCOUNTER — Ambulatory Visit: Payer: Medicare PPO | Admitting: Internal Medicine

## 2023-10-28 VITALS — BP 132/80 | HR 100 | Temp 97.5°F | Ht 62.5 in | Wt 151.0 lb

## 2023-10-28 DIAGNOSIS — N3946 Mixed incontinence: Secondary | ICD-10-CM

## 2023-10-28 DIAGNOSIS — Z9621 Cochlear implant status: Secondary | ICD-10-CM | POA: Diagnosis not present

## 2023-10-28 DIAGNOSIS — M25512 Pain in left shoulder: Secondary | ICD-10-CM | POA: Diagnosis not present

## 2023-10-28 DIAGNOSIS — N898 Other specified noninflammatory disorders of vagina: Secondary | ICD-10-CM

## 2023-10-28 DIAGNOSIS — M25511 Pain in right shoulder: Secondary | ICD-10-CM | POA: Diagnosis not present

## 2023-10-28 DIAGNOSIS — G8929 Other chronic pain: Secondary | ICD-10-CM | POA: Diagnosis not present

## 2023-10-28 MED ORDER — TRIAMCINOLONE ACETONIDE 0.1 % EX CREA: 1.0000 | TOPICAL_CREAM | Freq: Two times a day (BID) | CUTANEOUS | 6 refills | Status: AC

## 2023-10-28 NOTE — Patient Instructions (Addendum)
 We have sent in triamcinolone  to use twice a day on the itching area to help.   Matt Wagoner and Marletta Simmering are both very good podiatrists.

## 2023-10-28 NOTE — Progress Notes (Signed)
   Subjective:   Patient ID: Angela Barber, female    DOB: [birth year], 88 y.o.   MRN: [id number]  HPI The patient is an 88 YO female coming in for follow up. Having some inguinal itching around vagina. Does use pads daily and has tried switching brands without relief. No redness or discharge.   Review of Systems  Constitutional: Negative.   HENT:  Positive for hearing loss.   Eyes: Negative.   Respiratory:  Negative for cough, chest tightness and shortness of breath.   Cardiovascular:  Negative for chest pain, palpitations and leg swelling.  Gastrointestinal:  Negative for abdominal distention, abdominal pain, constipation, diarrhea, nausea and vomiting.  Genitourinary:        Itching  Musculoskeletal: Negative.   Skin: Negative.   Neurological: Negative.   Psychiatric/Behavioral: Negative.      Objective:  Physical Exam Constitutional:      Appearance: She is well-developed.  HENT:     Head: Normocephalic and atraumatic.  Cardiovascular:     Rate and Rhythm: Normal rate and regular rhythm.  Pulmonary:     Effort: Pulmonary effort is normal. No respiratory distress.     Breath sounds: Normal breath sounds. No wheezing or rales.  Abdominal:     General: Bowel sounds are normal. There is no distension.     Palpations: Abdomen is soft.     Tenderness: There is no abdominal tenderness. There is no rebound.  Musculoskeletal:     Cervical back: Normal range of motion.  Skin:    General: Skin is warm and dry.  Neurological:     Mental Status: She is alert and oriented to person, place, and time.     Coordination: Coordination normal.     Vitals:   10/28/23 1026  BP: 132/80  Pulse: 100  Temp: (!) 97.5 F (36.4 C)  TempSrc: Oral  SpO2: 96%  Weight: 151 lb (68.5 kg)  Height: 5' 2.5 (1.588 m)    Assessment & Plan:  Visit time 25 minutes in face to face communication with patient and coordination of care, additional 5 minutes spent in record review, coordination or  care, ordering tests, communicating/referring to other healthcare professionals, documenting in medical records all on the same day of the visit for total time 30 minutes spent on the visit.

## 2023-10-29 DIAGNOSIS — L03032 Cellulitis of left toe: Secondary | ICD-10-CM | POA: Diagnosis not present

## 2023-10-29 DIAGNOSIS — N898 Other specified noninflammatory disorders of vagina: Secondary | ICD-10-CM | POA: Insufficient documentation

## 2023-10-29 NOTE — Assessment & Plan Note (Signed)
 Both TM examined and small amounts of wax in ear canal.

## 2023-10-29 NOTE — Assessment & Plan Note (Signed)
 Could be irritation from pads and rx triamcinolone  cream to use. If this is helpful we discussed some barrier creams as needed.

## 2023-10-29 NOTE — Assessment & Plan Note (Signed)
 Taking trospium  and still having some issues but compared to previously before medicine she suspects it is helping some.

## 2023-10-29 NOTE — Assessment & Plan Note (Signed)
 Left shoulder s/p surgery and is not doing well still having significant pain. Ortho is assessing for infection currently. She is not inclined to do any further elective surgeries and will live with her right shoulder pain.

## 2023-11-25 DIAGNOSIS — M65311 Trigger thumb, right thumb: Secondary | ICD-10-CM | POA: Diagnosis not present

## 2023-12-02 DIAGNOSIS — H04123 Dry eye syndrome of bilateral lacrimal glands: Secondary | ICD-10-CM | POA: Diagnosis not present

## 2023-12-02 DIAGNOSIS — H353131 Nonexudative age-related macular degeneration, bilateral, early dry stage: Secondary | ICD-10-CM | POA: Diagnosis not present

## 2023-12-02 DIAGNOSIS — H26491 Other secondary cataract, right eye: Secondary | ICD-10-CM | POA: Diagnosis not present

## 2023-12-02 DIAGNOSIS — H40013 Open angle with borderline findings, low risk, bilateral: Secondary | ICD-10-CM | POA: Diagnosis not present

## 2023-12-02 DIAGNOSIS — H43813 Vitreous degeneration, bilateral: Secondary | ICD-10-CM | POA: Diagnosis not present

## 2023-12-02 DIAGNOSIS — H532 Diplopia: Secondary | ICD-10-CM | POA: Diagnosis not present

## 2023-12-09 DIAGNOSIS — M25511 Pain in right shoulder: Secondary | ICD-10-CM | POA: Diagnosis not present

## 2023-12-09 DIAGNOSIS — Z96612 Presence of left artificial shoulder joint: Secondary | ICD-10-CM | POA: Diagnosis not present

## 2023-12-23 DIAGNOSIS — M65311 Trigger thumb, right thumb: Secondary | ICD-10-CM | POA: Diagnosis not present

## 2023-12-28 DIAGNOSIS — H903 Sensorineural hearing loss, bilateral: Secondary | ICD-10-CM | POA: Diagnosis not present

## 2023-12-28 DIAGNOSIS — Z45321 Encounter for adjustment and management of cochlear device: Secondary | ICD-10-CM | POA: Diagnosis not present

## 2024-01-20 DIAGNOSIS — D485 Neoplasm of uncertain behavior of skin: Secondary | ICD-10-CM | POA: Diagnosis not present

## 2024-01-20 DIAGNOSIS — C4442 Squamous cell carcinoma of skin of scalp and neck: Secondary | ICD-10-CM | POA: Diagnosis not present

## 2024-01-26 DIAGNOSIS — C4442 Squamous cell carcinoma of skin of scalp and neck: Secondary | ICD-10-CM | POA: Diagnosis not present

## 2024-03-22 DIAGNOSIS — H26491 Other secondary cataract, right eye: Secondary | ICD-10-CM | POA: Diagnosis not present

## 2024-04-11 DIAGNOSIS — H903 Sensorineural hearing loss, bilateral: Secondary | ICD-10-CM | POA: Diagnosis not present

## 2024-04-19 ENCOUNTER — Encounter: Payer: Self-pay | Admitting: Internal Medicine

## 2024-04-19 ENCOUNTER — Ambulatory Visit: Admitting: Internal Medicine

## 2024-04-19 VITALS — BP 124/80 | HR 93 | Temp 97.7°F | Ht 62.5 in | Wt 151.0 lb

## 2024-04-19 DIAGNOSIS — H9193 Unspecified hearing loss, bilateral: Secondary | ICD-10-CM | POA: Diagnosis not present

## 2024-04-19 DIAGNOSIS — F5102 Adjustment insomnia: Secondary | ICD-10-CM | POA: Diagnosis not present

## 2024-04-19 DIAGNOSIS — Z0000 Encounter for general adult medical examination without abnormal findings: Secondary | ICD-10-CM | POA: Diagnosis not present

## 2024-04-19 DIAGNOSIS — M810 Age-related osteoporosis without current pathological fracture: Secondary | ICD-10-CM

## 2024-04-19 DIAGNOSIS — E782 Mixed hyperlipidemia: Secondary | ICD-10-CM | POA: Diagnosis not present

## 2024-04-19 DIAGNOSIS — K529 Noninfective gastroenteritis and colitis, unspecified: Secondary | ICD-10-CM | POA: Diagnosis not present

## 2024-04-19 DIAGNOSIS — E559 Vitamin D deficiency, unspecified: Secondary | ICD-10-CM

## 2024-04-19 MED ORDER — CHOLESTYRAMINE 4 G PO PACK: 4.0000 g | PACK | Freq: Every day | ORAL | 3 refills | Status: AC

## 2024-04-19 MED ORDER — FLUTICASONE PROPIONATE 50 MCG/ACT NA SUSP: 2.0000 | Freq: Every day | NASAL | 6 refills | Status: AC

## 2024-04-19 NOTE — Assessment & Plan Note (Signed)
 Stable and bothersome to different degrees.

## 2024-04-19 NOTE — Assessment & Plan Note (Signed)
Checking lipid panel and adjust as needed diet controlled currently.  

## 2024-04-19 NOTE — Assessment & Plan Note (Signed)
 Ears cleaned of wax and she did have buildup in the left which precluded adjustment of her aids.

## 2024-04-19 NOTE — Assessment & Plan Note (Signed)
Flu shot yearly. Pneumonia complete. Shingrix complete. Tetanus due at pharmacy. Colonoscopy aged out. Mammogram aged out, pap smear aged out and dexa complete. Counseled about sun safety and mole surveillance. Counseled about the dangers of distracted driving. Given 10 year screening recommendations.

## 2024-04-19 NOTE — Assessment & Plan Note (Addendum)
 Overall stable but bothersome and life limiting. She did try pelvic floor therapy but did not have a great experience and did not complete enough to get any benefit. Uses 1/2 packet cholestyramine  and this works variably.

## 2024-04-19 NOTE — Patient Instructions (Signed)
 We have cleaned out the ears and will check the labs today.

## 2024-04-19 NOTE — Progress Notes (Signed)
 Subjective:   Patient ID: Angela Barber, female    DOB: 05/04/1936, 88 y.o.   MRN: [id number]  HPI Here for medicare wellness and physical, no new complaints. Please see A/P for status and treatment of chronic medical problems.   Diet: heart healthy Physical activity: sedentary Depression/mood screen: negative Hearing: intact to whispered voice, using cochlear implant Visual acuity: grossly normal, performs annual eye exam  ADLs: capable Fall risk: low Home safety: good Cognitive evaluation: intact to orientation, naming, recall and repetition EOL planning: adv directives discussed, in place  [employer] Visit from 10/28/2023 in [facility] [place]  PHQ-2 Total Score 0    Flowsheet Row Office Visit from 10/28/2023 in [facility]  PHQ-9 Total Score 0      05/22/2021    8:00 AM 02/13/2022    3:53 PM 08/05/2022   10:06 AM 02/26/2023   10:50 AM 10/28/2023   10:30 AM  Fall Risk  Falls in the past year?  0 0 0 1  Was there an injury with Fall?  0 0 0 0  Fall Risk Category Calculator  0 0 0 1  Fall Risk Category (Retired)  Low  Low     (RETIRED) Patient Fall Risk Level High fall risk  Low fall risk      Patient at Risk for Falls Due to  No Fall Risks     Fall risk Follow up  Falls evaluation completed  Falls evaluation completed  Falls evaluation completed Falls evaluation completed     Data saved with a previous flowsheet row definition    I have personally reviewed and have noted 1. The patient's medical and social history - reviewed today no changes 2. Their use of alcohol , tobacco or illicit drugs 3. Their current medications and supplements 4. The patient's functional ability including ADL's, fall risks, home safety risks and hearing or visual impairment. 5. Diet and physical activities 6. Evidence for depression or mood disorders 7. Care team reviewed and updated 8.  The patient is not on an opioid pain  medication.  Patient Care Team: Rollene Almarie LABOR, MD as PCP - General (Internal Medicine) [facility] as Consulting Physician (Ophthalmology) Octavia Bruckner, MD as Consulting Physician (Ophthalmology) Past Medical History:  Diagnosis Date   Anxiety    C. difficile diarrhea 05/23/2005   Cochlear implant status    Depression    Diverticulosis    Family history of adverse reaction to anesthesia    sister had cognitive decline after surgery   GERD (gastroesophageal reflux disease)    occasional   Hearing loss of both ears    HLD (hyperlipidemia)    Hyperplastic colon polyp    Hypertension    Insomnia    Internal hemorrhoid    Osteoarthritis, knee    Past Surgical History:  Procedure Laterality Date   bilateral cataract surgery      with IOL   BUNIONECTOMY     right   CHOLECYSTECTOMY N/A 07/25/2016   Procedure: LAPAROSCOPIC CHOLECYSTECTOMY;  Surgeon: Krystal Russell, MD;  Location: [facility];  Service: General;  Laterality: N/A;   COCHLEAR IMPLANT  03/22/2012   right ear. Dr. Rena   ERCP N/A 07/31/2016   Procedure: ENDOSCOPIC RETROGRADE CHOLANGIOPANCREATOGRAPHY (ERCP);  Surgeon: Toribio SHAUNNA Cedar, MD;  Location: [facility];  Service: Endoscopy;  Laterality: N/A;   REVERSE SHOULDER ARTHROPLASTY Left 10/30/2022   Procedure: REVERSE SHOULDER ARTHROPLASTY;  Surgeon: Melita Drivers,  MD;  Location: [facility];  Service: Orthopedics;  Laterality: Left;    ROTATOR CUFF REPAIR Left 09/22/2010   ROTATOR CUFF REPAIR Right 2007 & 2009   TONSILLECTOMY     TOTAL KNEE ARTHROPLASTY Right 05/20/2021   Procedure: TOTAL KNEE ARTHROPLASTY;  Surgeon: Melodi Lerner, MD;  Location: [facility];  Service: Orthopedics;  Laterality: Right;   VAGINAL HYSTERECTOMY     Family History  Problem Relation Age of Onset   Ovarian cancer Mother    Transient ischemic attack Mother    Breast cancer Maternal Aunt        aunts   Stroke Sister    Anxiety disorder Daughter     Autoimmune disease Daughter    Review of Systems  Constitutional: Negative.   HENT: Negative.    Eyes: Negative.   Respiratory:  Positive for cough. Negative for chest tightness and shortness of breath.   Cardiovascular:  Negative for chest pain, palpitations and leg swelling.  Gastrointestinal:  Negative for abdominal distention, abdominal pain, constipation, diarrhea, nausea and vomiting.  Musculoskeletal:  Positive for arthralgias.  Skin: Negative.   Neurological: Negative.   Psychiatric/Behavioral: Negative.      Objective:  Physical Exam Constitutional:      Appearance: She is well-developed.  HENT:     Head: Normocephalic and atraumatic.     Right Ear: There is impacted cerumen.     Left Ear: There is impacted cerumen.     Ears:     Comments:  both ears canal impacted with copious hard wax, examination post ear lavage canal is clear and no bleeding or complications noted.   Cardiovascular:     Rate and Rhythm: Normal rate and regular rhythm.  Pulmonary:     Effort: Pulmonary effort is normal. No respiratory distress.     Breath sounds: Normal breath sounds. No wheezing or rales.  Abdominal:     General: Bowel sounds are normal. There is no distension.     Palpations: Abdomen is soft.     Tenderness: There is no abdominal tenderness. There is no rebound.  Musculoskeletal:     Cervical back: Normal range of motion.  Skin:    General: Skin is warm and dry.  Neurological:     Mental Status: She is alert and oriented to person, place, and time.     Coordination: Coordination normal.     Vitals:   04/19/24 1047  BP: 124/80  Pulse: 93  Temp: 97.7 F (36.5 C)  TempSrc: Oral  SpO2: 92%  Weight: 151 lb (68.5 kg)  Height: (S) 5' 2.5 (1.588 m)   Assessment & Plan:

## 2024-04-19 NOTE — Assessment & Plan Note (Signed)
 Checking CMP and not on medication. Prior treatment with fosamax.

## 2024-05-04 ENCOUNTER — Telehealth: Payer: Self-pay | Admitting: Internal Medicine

## 2024-05-04 DIAGNOSIS — N3281 Overactive bladder: Secondary | ICD-10-CM

## 2024-05-04 NOTE — Telephone Encounter (Signed)
 Prescription Request  05/04/2024  LOV: 04/19/2024  What is the name of the medication or equipment? Trospium  Chloride 60 MG CP24   Have you contacted your pharmacy to request a refill? No   Which pharmacy would you like this sent to?  [facility] [place], [place] - [address] [place] [address] Phone: [phone] Fax: [phone]  Patient notified that their request is being sent to the clinical staff for review and that they should receive a response within 2 business days.   Please advise at Mobile [phone] (mobile)

## 2024-05-05 NOTE — Telephone Encounter (Signed)
 Please advise if this ok to refill?

## 2024-05-06 MED ORDER — TROSPIUM CHLORIDE ER 60 MG PO CP24: 60.0000 mg | ORAL_CAPSULE | Freq: Every day | ORAL | 3 refills | Status: AC

## 2024-05-06 NOTE — Telephone Encounter (Signed)
 Sent in

## 2024-05-17 ENCOUNTER — Encounter: Payer: Self-pay | Admitting: Internal Medicine

## 2024-05-17 ENCOUNTER — Ambulatory Visit (INDEPENDENT_AMBULATORY_CARE_PROVIDER_SITE_OTHER): Admitting: Internal Medicine

## 2024-05-17 ENCOUNTER — Ambulatory Visit: Payer: Self-pay

## 2024-05-17 VITALS — BP 124/82 | HR 100 | Temp 97.6°F | Ht 62.5 in | Wt 153.0 lb

## 2024-05-17 DIAGNOSIS — H6122 Impacted cerumen, left ear: Secondary | ICD-10-CM | POA: Diagnosis not present

## 2024-05-17 NOTE — Patient Instructions (Signed)
 Your left ear was cleared of cerumen today  Please continue all other medications as before, and refills have been done if requested.  Please have the pharmacy call with any other refills you may need.  Please keep your appointments with your specialists as you may have planned

## 2024-05-17 NOTE — Progress Notes (Signed)
 Patient ID: Angela Barber, female   DOB: 1936/06/09, 88 y.o.   MRN: [id number]        Chief Complaint: follow up persistent left cerumen impaction       HPI:  Angela Barber is a 88 y.o. female here with c/o reduced hearing to left ear due to wax, and unable to complete audiology exam.  This is second visit for same issue, as apparently the previous attempt to clear the wax was not successful  Pt denies chest pain, increased sob or doe, wheezing, orthopnea, PND, increased LE swelling, palpitations, dizziness or syncope.   Pt denies polydipsia, polyuria, or new focal neuro s/s.   Has right cochlear implant x 15 yrs.         Wt Readings from Last 3 Encounters:  05/17/24 153 lb (69.4 kg)  04/19/24 151 lb (68.5 kg)  10/28/23 151 lb (68.5 kg)   BP Readings from Last 3 Encounters:  05/17/24 124/82  04/19/24 124/80  10/28/23 132/80         Past Medical History:  Diagnosis Date   Anxiety    C. difficile diarrhea 05/23/2005   Cochlear implant status    Depression    Diverticulosis    Family history of adverse reaction to anesthesia    sister had cognitive decline after surgery   GERD (gastroesophageal reflux disease)    occasional   Hearing loss of both ears    HLD (hyperlipidemia)    Hyperplastic colon polyp    Hypertension    Insomnia    Internal hemorrhoid    Osteoarthritis, knee    Past Surgical History:  Procedure Laterality Date   bilateral cataract surgery      with IOL   BUNIONECTOMY     right   CHOLECYSTECTOMY N/A 07/25/2016   Procedure: LAPAROSCOPIC CHOLECYSTECTOMY;  Surgeon: Krystal Russell, MD;  Location: [facility];  Service: General;  Laterality: N/A;   COCHLEAR IMPLANT  03/22/2012   right ear. Dr. Rena   ERCP N/A 07/31/2016   Procedure: ENDOSCOPIC RETROGRADE CHOLANGIOPANCREATOGRAPHY (ERCP);  Surgeon: Toribio SHAUNNA Cedar, MD;  Location: [facility];  Service: Endoscopy;  Laterality: N/A;   REVERSE SHOULDER ARTHROPLASTY Left 10/30/2022   Procedure: REVERSE SHOULDER  ARTHROPLASTY;  Surgeon: Melita Drivers, MD;  Location: [facility];  Service: Orthopedics;  Laterality: Left;    ROTATOR CUFF REPAIR Left 09/22/2010   ROTATOR CUFF REPAIR Right 2007 & 2009   TONSILLECTOMY     TOTAL KNEE ARTHROPLASTY Right 05/20/2021   Procedure: TOTAL KNEE ARTHROPLASTY;  Surgeon: Melodi Lerner, MD;  Location: [facility];  Service: Orthopedics;  Laterality: Right;   VAGINAL HYSTERECTOMY      reports that she has quit smoking. Her smoking use included cigarettes. She has never used smokeless tobacco. She reports current alcohol  use. She reports that she does not use drugs. family history includes Anxiety disorder in her daughter; Autoimmune disease in her daughter; Breast cancer in her maternal aunt; Ovarian cancer in her mother; Stroke in her sister; Transient ischemic attack in her mother. Allergies  Allergen Reactions   Other Nausea And Vomiting    Egg plant   Current Outpatient Medications on File Prior to Visit  Medication Sig Dispense Refill   acetaminophen  (TYLENOL ) 650 MG CR tablet Take 650 mg by mouth every 8 (eight) hours as needed for pain.     cholestyramine  (QUESTRAN ) 4 g packet Take 1 packet (4 g total) by mouth daily. 90 packet 3   cyclobenzaprine  (FLEXERIL ) 10 MG  tablet Take 1 tablet (10 mg total) by mouth 3 (three) times daily as needed for muscle spasms. 30 tablet 1   diphenhydramine -acetaminophen  (TYLENOL  PM) 25-500 MG TABS tablet Take 1 tablet by mouth at bedtime as needed (sleep).     fluticasone  (FLONASE ) 50 MCG/ACT nasal spray Place 2 sprays into both nostrils daily. 16 g 6   Multiple Vitamin (MULTIVITAMIN WITH MINERALS) TABS tablet Take 2 tablets by mouth daily. Gummy Vit     ondansetron  (ZOFRAN ) 4 MG tablet Take 1 tablet (4 mg total) by mouth every 8 (eight) hours as needed for nausea or vomiting. 10 tablet 0   Propylene Glycol (SYSTANE COMPLETE OP) Place 1 drop into the right eye 2 (two) times daily as needed (dry eyes).     triamcinolone  cream  (KENALOG ) 0.1 % Apply 1 Application topically 2 (two) times daily. 100 g 6   Trospium  Chloride 60 MG CP24 Take 1 capsule (60 mg total) by mouth daily. 90 capsule 3   No current facility-administered medications on file prior to visit.        ROS:  All others reviewed and negative.  Objective        PE:  BP 124/82   Pulse 100   Temp 97.6 F (36.4 C) (Temporal)   Ht 5' 2.5 (1.588 m)   Wt 153 lb (69.4 kg)   BMI 27.54 kg/m                 Constitutional: Pt appears in NAD               HENT: Head: NCAT.                Right Ear: External ear normal.                 Left Ear: External ear normal. Left cerumen impaction resolved and hearing some improved               Eyes: . Pupils are equal, round, and reactive to light. Conjunctivae and EOM are normal               Nose: without d/c or deformity               Neck: Neck supple. Gross normal ROM               Cardiovascular: Normal rate and regular rhythm.                 Pulmonary/Chest: Effort normal and breath sounds without rales or wheezing.                             Neurological: Pt is alert. At baseline orientation, motor grossly intact               Skin: Skin is warm. No rashes, no other new lesions, LE edema - none               Psychiatric: Pt behavior is normal without agitation   Micro: none  Cardiac tracings I have personally interpreted today:  none  Pertinent Radiological findings (summarize): none   Lab Results  Component Value Date   WBC 7.8 02/26/2023   HGB 14.8 02/26/2023   HCT 44.8 02/26/2023   PLT 302.0 02/26/2023   GLUCOSE 77 02/26/2023   CHOL 198 02/26/2023   TRIG 267.0 (H) 02/26/2023   HDL 46.90 02/26/2023   LDLDIRECT 120.0 02/26/2023  LDLCALC 126 (H) 10/29/2018   ALT 18 02/26/2023   AST 23 02/26/2023   NA 141 02/26/2023   K 4.2 02/26/2023   CL 101 02/26/2023   CREATININE 0.72 02/26/2023   BUN 20 02/26/2023   CO2 32 02/26/2023   TSH 4.54 (H) 01/06/2020   INR 1.18 03/20/2016   HGBA1C  5.7 (H) 03/20/2016   Assessment/Plan:  [name] is a 88 y.o. White or Caucasian [1] female with  has a past medical history of Anxiety, C. difficile diarrhea (05/23/2005), Cochlear implant status, Depression, Diverticulosis, Family history of adverse reaction to anesthesia, GERD (gastroesophageal reflux disease), Hearing loss of both ears, HLD (hyperlipidemia), Hyperplastic colon polyp, Hypertension, Insomnia, Internal hemorrhoid, and Osteoarthritis, knee.  Hearing loss of left ear due to cerumen impaction Resolved today, pt to continue with return to audiology for hopefully successful hearing test.    Followup: Return if symptoms worsen or fail to improve.  Lynwood Rush, MD 05/17/2024 4:52 PM [facility]

## 2024-05-17 NOTE — Assessment & Plan Note (Signed)
 Resolved today, pt to continue with return to audiology for hopefully successful hearing test.

## 2024-05-17 NOTE — Telephone Encounter (Signed)
 FYI Only or Action Required?: FYI only for provider.  Patient was last seen in primary care on 04/19/2024 by Rollene Almarie LABOR, MD.  Called Nurse Triage reporting Cerumen Impaction.  Symptoms began about a month ago.  Interventions attempted: Other: has been seen for the same.  Symptoms are: unchanged.  Triage Disposition: See PCP Within 2 Weeks  Patient/caregiver understands and will follow disposition?: Yes         Copied from CRM [id number]. Topic: Clinical - Red Word Triage >> May 17, 2024  2:24 PM Precious C wrote: [facility] that prompted transfer to Nurse Triage:  Earwax buildup           Reason for Disposition  [1] Earwax problem AND [2] no improvement using Care Advice  Answer Assessment - Initial Assessment Questions 1. LOCATION: Which ear is involved?      Left ear  2. SYMPTOMS: What are the main symptoms? (e.g., fullness, decreased hearing, itching, discomfort)     Decreased hearing  3. ONSET: When did the symptoms start?     About a month ago 4. PAIN: Is there any earache? How bad is it?  (Scale 0-10; or none, mild, moderate, severe)     No 5. OBJECTS: Do you use cotton swabs  (Q-tips) in your ear? Have you put anything else in your ear?     No 6. EARWAX HISTORY: Have you had problems with earwax before? If Yes, ask: What did you do the last time?     No  Protocols used: Earwax-A-AH

## 2024-06-21 ENCOUNTER — Ambulatory Visit

## 2024-06-21 VITALS — BP 130/80 | HR 69 | Ht 62.5 in | Wt 151.0 lb

## 2024-06-21 DIAGNOSIS — Z0000 Encounter for general adult medical examination without abnormal findings: Secondary | ICD-10-CM

## 2024-06-21 DIAGNOSIS — L570 Actinic keratosis: Secondary | ICD-10-CM | POA: Diagnosis not present

## 2024-06-21 DIAGNOSIS — D225 Melanocytic nevi of trunk: Secondary | ICD-10-CM | POA: Diagnosis not present

## 2024-06-21 DIAGNOSIS — Z23 Encounter for immunization: Secondary | ICD-10-CM

## 2024-06-21 DIAGNOSIS — D485 Neoplasm of uncertain behavior of skin: Secondary | ICD-10-CM | POA: Diagnosis not present

## 2024-06-21 DIAGNOSIS — D224 Melanocytic nevi of scalp and neck: Secondary | ICD-10-CM | POA: Diagnosis not present

## 2024-06-21 DIAGNOSIS — L814 Other melanin hyperpigmentation: Secondary | ICD-10-CM | POA: Diagnosis not present

## 2024-06-21 DIAGNOSIS — L578 Other skin changes due to chronic exposure to nonionizing radiation: Secondary | ICD-10-CM | POA: Diagnosis not present

## 2024-06-21 DIAGNOSIS — L821 Other seborrheic keratosis: Secondary | ICD-10-CM | POA: Diagnosis not present

## 2024-06-21 DIAGNOSIS — L719 Rosacea, unspecified: Secondary | ICD-10-CM | POA: Diagnosis not present

## 2024-06-21 NOTE — Progress Notes (Signed)
 Subjective:   Angela Barber is a 88 y.o. who presents for a Medicare Wellness preventive visit.  As a reminder, Annual Wellness Visits don't include a physical exam, and some assessments may be limited, especially if this visit is performed virtually. We may recommend an in-person follow-up visit with your provider if needed.  Visit Complete: In person  Persons Participating in Visit: Patient.  AWV Questionnaire: No: Patient Medicare AWV questionnaire was not completed prior to this visit.  Cardiac Risk Factors include: advanced age (>40men, >69 women);dyslipidemia     Objective:    Today's Vitals   06/21/24 0809  BP: 130/80  Pulse: 69  SpO2: 97%  Weight: 151 lb (68.5 kg)  Height: 5' 2.5 (1.588 m)   Body mass index is 27.18 kg/m.     06/21/2024    8:09 AM 06/12/2023   10:32 AM 10/21/2022    1:43 PM 09/01/2022   11:10 AM 02/13/2022    3:51 PM 05/20/2021    6:00 PM 02/11/2021    9:55 AM  Advanced Directives  Does Patient Have a Medical Advance Directive? No Yes Yes Yes Yes No Yes  Type of Special educational needs teacher of [place];Living will  Living will;Healthcare Power of Attorney Living will;Healthcare Power of Attorney  Living will;Healthcare Power of Attorney  Does patient want to make changes to medical advance directive?  No - Patient declined No - Patient declined No - Patient declined No - Patient declined  No - Patient declined  Copy of Healthcare Power of Attorney in Chart?  No - copy requested  No - copy requested No - copy requested  No - copy requested  Would patient like information on creating a medical advance directive? No - Patient declined     No - Patient declined     Current Medications (verified) Outpatient Encounter Medications as of 06/21/2024  Medication Sig   acetaminophen  (TYLENOL ) 650 MG CR tablet Take 650 mg by mouth every 8 (eight) hours as needed for pain.   cholestyramine  (QUESTRAN ) 4 g packet Take 1 packet (4 g total) by mouth  daily.   cyclobenzaprine  (FLEXERIL ) 10 MG tablet Take 1 tablet (10 mg total) by mouth 3 (three) times daily as needed for muscle spasms.   diphenhydramine -acetaminophen  (TYLENOL  PM) 25-500 MG TABS tablet Take 1 tablet by mouth at bedtime as needed (sleep).   fluticasone  (FLONASE ) 50 MCG/ACT nasal spray Place 2 sprays into both nostrils daily.   Multiple Vitamin (MULTIVITAMIN WITH MINERALS) TABS tablet Take 2 tablets by mouth daily. Gummy Vit   ondansetron  (ZOFRAN ) 4 MG tablet Take 1 tablet (4 mg total) by mouth every 8 (eight) hours as needed for nausea or vomiting.   Propylene Glycol (SYSTANE COMPLETE OP) Place 1 drop into the right eye 2 (two) times daily as needed (dry eyes).   triamcinolone  cream (KENALOG ) 0.1 % Apply 1 Application topically 2 (two) times daily.   Trospium  Chloride 60 MG CP24 Take 1 capsule (60 mg total) by mouth daily.   No facility-administered encounter medications on file as of 06/21/2024.    Allergies (verified) Other   History: Past Medical History:  Diagnosis Date   Anxiety    C. difficile diarrhea 05/23/2005   Cochlear implant status    Depression    Diverticulosis    Family history of adverse reaction to anesthesia    sister had cognitive decline after surgery   GERD (gastroesophageal reflux disease)    occasional   Hearing loss of both  ears    HLD (hyperlipidemia)    Hyperplastic colon polyp    Hypertension    Insomnia    Internal hemorrhoid    Osteoarthritis, knee    Past Surgical History:  Procedure Laterality Date   bilateral cataract surgery      with IOL   BUNIONECTOMY     right   CHOLECYSTECTOMY N/A 07/25/2016   Procedure: LAPAROSCOPIC CHOLECYSTECTOMY;  Surgeon: Krystal Russell, MD;  Location: [facility];  Service: General;  Laterality: N/A;   COCHLEAR IMPLANT  03/22/2012   right ear. Dr. Rena   ERCP N/A 07/31/2016   Procedure: ENDOSCOPIC RETROGRADE CHOLANGIOPANCREATOGRAPHY (ERCP);  Surgeon: Toribio SHAUNNA Cedar, MD;  Location: [facility];  Service: Endoscopy;  Laterality: N/A;   REVERSE SHOULDER ARTHROPLASTY Left 10/30/2022   Procedure: REVERSE SHOULDER ARTHROPLASTY;  Surgeon: Melita Drivers, MD;  Location: [facility];  Service: Orthopedics;  Laterality: Left;    ROTATOR CUFF REPAIR Left 09/22/2010   ROTATOR CUFF REPAIR Right 2007 & 2009   TONSILLECTOMY     TOTAL KNEE ARTHROPLASTY Right 05/20/2021   Procedure: TOTAL KNEE ARTHROPLASTY;  Surgeon: Melodi Lerner, MD;  Location: [facility];  Service: Orthopedics;  Laterality: Right;   VAGINAL HYSTERECTOMY     Family History  Problem Relation Age of Onset   Ovarian cancer Mother    Transient ischemic attack Mother    Breast cancer Maternal Aunt        aunts   Stroke Sister    Anxiety disorder Daughter    Autoimmune disease Daughter    Social History   Socioeconomic History   Marital status: Widowed    Spouse name: Not on file   Number of children: 2   Years of education: Master's   Highest education level: Not on file  Occupational History   Occupation: Magazine features editor: RETIRED  Tobacco Use   Smoking status: Former    Current packs/day: 0.00    Average packs/day: 1 pack/day for 15.0 years (15.0 ttl pk-yrs)    Types: Cigarettes    Start date: 09/22/1973    Quit date: 09/22/1988    Years since quitting: 35.7   Smokeless tobacco: Never   Tobacco comments:    Quit over 25 years ago   Vaping Use   Vaping status: Never Used  Substance and Sexual Activity   Alcohol  use: Yes    Alcohol /week: 1.0 standard drink of alcohol     Types: 1 Glasses of wine per week    Comment: rarely   Drug use: No   Sexual activity: Not Currently  Other Topics Concern   Not on file  Social History Narrative   Patient is widowed and lives alone.Patient has two adult children.Patient is retired.Patient has a Event organiser.Patient is right-handed.Patient drinks 1/2 cup of coffee daily.   Social Drivers of Corporate investment banker Strain: Low Risk  (06/21/2024)   Overall  Financial Resource Strain (CARDIA)    Difficulty of Paying Living Expenses: Not hard at all  Food Insecurity: No Food Insecurity (06/21/2024)   Hunger Vital Sign    Worried About Running Out of Food in the Last Year: Never true    Ran Out of Food in the Last Year: Never true  Transportation Needs: No Transportation Needs (06/21/2024)   PRAPARE - Administrator, Civil Service (Medical): No    Lack of Transportation (Non-Medical): No  Physical Activity: Sufficiently Active (06/21/2024)   Exercise Vital Sign    Days of Exercise  per Week: 5 days    Minutes of Exercise per Session: 40 min  Stress: No Stress Concern Present (06/21/2024)   [employer] of Occupational Health - Occupational Stress Questionnaire    Feeling of Stress: Not at all  Social Connections: Moderately Integrated (06/21/2024)   Social Connection and Isolation Panel    Frequency of Communication with Friends and Family: More than three times a week    Frequency of Social Gatherings with Friends and Family: Twice a week    Attends Religious Services: More than 4 times per year    Active Member of [employer] or Organizations: Yes    Attends Banker Meetings: More than 4 times per year    Marital Status: Widowed    Tobacco Counseling Counseling given: Not Answered Tobacco comments: Quit over 25 years ago     Clinical Intake:  Pre-visit preparation completed: Yes  Pain : No/denies pain     BMI - recorded: 27.18 Nutritional Status: BMI 25 -29 Overweight Nutritional Risks: None Diabetes: No  Lab Results  Component Value Date   HGBA1C 5.7 (H) 03/20/2016     How often do you need to have someone help you when you read instructions, pamphlets, or other written materials from your doctor or pharmacy?: 1 - Never  Interpreter Needed?: No  Information entered by :: Verdie Saba, CMA   Activities of Daily Living     06/21/2024    8:19 AM  In your present state of health, do you have any  difficulty performing the following activities:  Hearing? 1  Comment wears hearing aids  Vision? 0  Difficulty concentrating or making decisions? 0  Walking or climbing stairs? 0  Dressing or bathing? 0  Doing errands, shopping? 0  Preparing Food and eating ? N  Using the Toilet? N  In the past six months, have you accidently leaked urine? Y  Comment wears a pad  Do you have problems with loss of bowel control? Y  Comment wears a pad  Managing your Medications? N  Managing your Finances? N  Housekeeping or managing your Housekeeping? N    Patient Care Team: Rollene Almarie LABOR, MD as PCP - General (Internal Medicine) [facility] as Consulting Physician (Ophthalmology) Octavia Bruckner, MD as Consulting Physician (Ophthalmology)  I have updated your Care Teams any recent Medical Services you may have received from other providers in the past year.     Assessment:   This is a routine wellness examination for Lesa.  Hearing/Vision screen Hearing Screening - Comments:: Wears hearing aids Vision Screening - Comments:: Wears rx glasses - up to date with routine eye exams with [facility]   Goals Addressed               This Visit's Progress     Patient Stated (pt-stated)        Patient stated she plans to continue walking/exercising - want to lose about 20lbs       Depression Screen     06/21/2024    8:17 AM 10/28/2023   10:33 AM 08/05/2022   10:07 AM 08/05/2022   10:06 AM 02/13/2022    3:54 PM 02/11/2021    9:51 AM 01/06/2020   12:30 PM  PHQ 2/9 Scores  PHQ - 2 Score 3 0 0 0 0 0 0  PHQ- 9 Score 4 0 0        Fall Risk     06/21/2024    8:16 AM 10/28/2023  10:30 AM 02/26/2023   10:50 AM 08/05/2022   10:06 AM 02/13/2022    3:53 PM  Fall Risk   Falls in the past year? 0 1 0 0 0  Number falls in past yr: 0 0 0 0 0  Injury with Fall? 0 0 0 0 0  Risk for fall due to : No Fall Risks    No Fall Risks  Follow up Falls evaluation  completed;Falls prevention discussed Falls evaluation completed Falls evaluation completed Falls evaluation completed  Falls evaluation completed      Data saved with a previous flowsheet row definition    MEDICARE RISK AT HOME:  Medicare Risk at Home Any stairs in or around the home?: No If so, are there any without handrails?: No Home free of loose throw rugs in walkways, pet beds, electrical cords, etc?: Yes Adequate lighting in your home to reduce risk of falls?: Yes Life alert?: No Use of a cane, walker or w/c?: No Grab bars in the bathroom?: Yes Shower chair or bench in shower?: Yes Elevated toilet seat or a handicapped toilet?: Yes  TIMED UP AND GO:  Was the test performed?  No  Cognitive Function: 6CIT completed    10/29/2018    2:33 PM  MMSE - Mini Mental State Exam  Orientation to time 5  Orientation to Place 5  Registration 3  Attention/ Calculation 5  Recall 2  Language- name 2 objects 2  Language- repeat 1  Language- follow 3 step command 3  Language- read & follow direction 1  Write a sentence 1  Copy design 1  Total score 29        06/21/2024    8:21 AM 02/13/2022    3:55 PM  6CIT Screen  What Year? 0 points 0 points  What month? 0 points 0 points  What time? 0 points 0 points  Count back from 20 0 points 0 points  Months in reverse 0 points 0 points  Repeat phrase 4 points 0 points  Total Score 4 points 0 points    Immunizations Immunization History  Administered Date(s) Administered   Fluad Quad(high Dose 65+) 06/23/2019, 06/29/2020   H1N1 08/28/2008   INFLUENZA, HIGH DOSE SEASONAL PF 08/15/2013, 06/19/2016, 10/06/2017, 08/30/2018, 06/21/2024   Influenza Split 09/07/2012   Influenza Whole 09/04/2010   Influenza,inj,Quad PF,6+ Mos 08/10/2014   Influenza-Unspecified 06/23/2019, 08/04/2023   PFIZER(Purple Top)SARS-COV-2 Vaccination 10/27/2019, 11/22/2019, 07/17/2020, 01/16/2021   Pneumococcal Conjugate-13 08/10/2014   Pneumococcal  Polysaccharide-23 05/24/2009, 02/19/2010   Td 05/24/2009, 02/19/2010   Zoster Recombinant(Shingrix) 08/31/2019, 02/20/2021   Zoster, Live 09/25/2014    Screening Tests Health Maintenance  Topic Date Due   DTaP/Tdap/Td (3 - Tdap) 02/20/2020   COVID-19 Vaccine (5 - 2025-26 season) 05/23/2024   Mammogram  08/27/2024   Medicare Annual Wellness (AWV)  06/21/2025   Pneumococcal Vaccine: 50+ Years  Completed   Influenza Vaccine  Completed   DEXA SCAN  Completed   Zoster Vaccines- Shingrix  Completed   HPV VACCINES  Aged Out   Meningococcal B Vaccine  Aged Out    Health Maintenance Items Addressed:  Vaccines Given today: Influenza - High Dose  Additional Screening:  Vision Screening: Recommended annual ophthalmology exams for early detection of glaucoma and other disorders of the eye. Is the patient up to date with their annual eye exam?  Yes  Who is the provider or what is the name of the office in which the patient attends annual eye exams? [facility] of  [facility]  Dental Screening: Recommended annual dental exams for proper oral hygiene  Community Resource Referral / Chronic Care Management: CRR required this visit?  No   CCM required this visit?  No   Plan:    I have personally reviewed and noted the following in the patient's chart:   Medical and social history Use of alcohol , tobacco or illicit drugs  Current medications and supplements including opioid prescriptions. Patient is not currently taking opioid prescriptions. Functional ability and status Nutritional status Physical activity Advanced directives List of other physicians Hospitalizations, surgeries, and ER visits in previous 12 months Vitals Screenings to include cognitive, depression, and falls Referrals and appointments  In addition, I have reviewed and discussed with patient certain preventive protocols, quality metrics, and best practice recommendations. A written personalized care plan for  preventive services as well as general preventive health recommendations were provided to patient.   Verdie CHRISTELLA Saba, CMA   06/21/2024   After Visit Summary: (MyChart) Due to this being a telephonic visit, the after visit summary with patients personalized plan was offered to patient via MyChart   Notes: Scheduled an 8-mth f/u w/PCP.

## 2024-06-21 NOTE — Patient Instructions (Signed)
 Angela Barber,  Thank you for taking the time for your Medicare Wellness Visit. I appreciate your continued commitment to your health goals. Please review the care plan we discussed, and feel free to reach out if I can assist you further.  Medicare recommends these wellness visits once per year to help you and your care team stay ahead of potential health issues. These visits are designed to focus on prevention, allowing your provider to concentrate on managing your acute and chronic conditions during your regular appointments.  Please note that Annual Wellness Visits do not include a physical exam. Some assessments may be limited, especially if the visit was conducted virtually. If needed, we may recommend a separate in-person follow-up with your provider.  Ongoing Care Seeing your primary care provider every 3 to 6 months helps us  monitor your health and provide consistent, personalized care.   Referrals If a referral was made during today's visit and you haven't received any updates within two weeks, please contact the referred provider directly to check on the status.  Recommended Screenings:  Health Maintenance  Topic Date Due   DTaP/Tdap/Td vaccine (3 - Tdap) 02/20/2020   Flu Shot  04/22/2024   COVID-19 Vaccine (5 - 2025-26 season) 05/23/2024   Breast Cancer Screening  08/27/2024   Medicare Annual Wellness Visit  06/21/2025   Pneumococcal Vaccine for age over 34  Completed   DEXA scan (bone density measurement)  Completed   Zoster (Shingles) Vaccine  Completed   HPV Vaccine  Aged Out   Meningitis B Vaccine  Aged Out       06/12/2023   10:32 AM  Advanced Directives  Does Patient Have a Medical Advance Directive? Yes  Type of Estate agent of [place];Living will  Does patient want to make changes to medical advance directive? No - Patient declined  Copy of Healthcare Power of Attorney in Chart? No - copy requested   Advance Care Planning is important because  it: Ensures you receive medical care that aligns with your values, goals, and preferences. Provides guidance to your family and loved ones, reducing the emotional burden of decision-making during critical moments.  Vision: Annual vision screenings are recommended for early detection of glaucoma, cataracts, and diabetic retinopathy. These exams can also reveal signs of chronic conditions such as diabetes and high blood pressure.  Dental: Annual dental screenings help detect early signs of oral cancer, gum disease, and other conditions linked to overall health, including heart disease and diabetes.

## 2024-06-29 ENCOUNTER — Ambulatory Visit: Admitting: Internal Medicine

## 2024-06-29 ENCOUNTER — Encounter: Payer: Self-pay | Admitting: Internal Medicine

## 2024-06-29 VITALS — BP 120/80 | HR 87 | Temp 98.1°F | Ht 62.5 in | Wt 151.0 lb

## 2024-06-29 DIAGNOSIS — M810 Age-related osteoporosis without current pathological fracture: Secondary | ICD-10-CM

## 2024-06-29 DIAGNOSIS — K529 Noninfective gastroenteritis and colitis, unspecified: Secondary | ICD-10-CM | POA: Diagnosis not present

## 2024-06-29 DIAGNOSIS — E782 Mixed hyperlipidemia: Secondary | ICD-10-CM

## 2024-06-29 DIAGNOSIS — N3946 Mixed incontinence: Secondary | ICD-10-CM

## 2024-06-29 DIAGNOSIS — R1110 Vomiting, unspecified: Secondary | ICD-10-CM | POA: Diagnosis not present

## 2024-06-29 LAB — COMPREHENSIVE METABOLIC PANEL WITH GFR
ALT: 14 U/L (ref 0–35)
AST: 19 U/L (ref 0–37)
Albumin: 4.3 g/dL (ref 3.5–5.2)
Alkaline Phosphatase: 49 U/L (ref 39–117)
BUN: 16 mg/dL (ref 6–23)
CO2: 28 meq/L (ref 19–32)
Calcium: 9.3 mg/dL (ref 8.4–10.5)
Chloride: 102 meq/L (ref 96–112)
Creatinine, Ser: 0.73 mg/dL (ref 0.40–1.20)
GFR: 74.43 mL/min (ref >60.00)
Glucose, Bld: 79 mg/dL (ref 70–99)
Potassium: 3.7 meq/L (ref 3.5–5.1)
Sodium: 140 meq/L (ref 135–145)
Total Bilirubin: 0.6 mg/dL (ref 0.2–1.2)
Total Protein: 6.8 g/dL (ref 6.0–8.3)

## 2024-06-29 LAB — CBC
HCT: 43.4 % (ref 36.0–46.0)
Hemoglobin: 14.8 g/dL (ref 12.0–15.0)
MCHC: 34.1 g/dL (ref 30.0–36.0)
MCV: 91.2 fl (ref 78.0–100.0)
Platelets: 292 K/uL (ref 150.0–400.0)
RBC: 4.76 Mil/uL (ref 3.87–5.11)
RDW: 13.7 % (ref 11.5–15.5)
WBC: 8.1 K/uL (ref 4.0–10.5)

## 2024-06-29 LAB — VITAMIN D 25 HYDROXY (VIT D DEFICIENCY, FRACTURES): VITD: 26.71 ng/mL — ABNORMAL LOW (ref 30.00–100.00)

## 2024-06-29 LAB — LIPID PANEL
Cholesterol: 184 mg/dL (ref 0–200)
HDL: 38.8 mg/dL — ABNORMAL LOW (ref >39.00)
LDL Cholesterol: 107 mg/dL — ABNORMAL HIGH (ref 0–99)
NonHDL: 145.48
Total CHOL/HDL Ratio: 5
Triglycerides: 193 mg/dL — ABNORMAL HIGH (ref 0.0–149.0)
VLDL: 38.6 mg/dL (ref 0.0–40.0)

## 2024-06-29 NOTE — Patient Instructions (Signed)
 We will get you in with GI and will check the labs today.

## 2024-06-29 NOTE — Assessment & Plan Note (Signed)
 New in the last few months and could be uncontrolled GERD. Refer to gI for assessment. She does not have any symptoms of food getting stuck.

## 2024-06-29 NOTE — Assessment & Plan Note (Signed)
 Checking vitamin d  and CMP today.

## 2024-06-29 NOTE — Assessment & Plan Note (Signed)
 Ordered lipid panel as due and adjust as needed

## 2024-06-29 NOTE — Assessment & Plan Note (Signed)
 Bladder dysfunction persists despite previous therapy.

## 2024-06-29 NOTE — Assessment & Plan Note (Addendum)
 Refrerral to GI for worsening of chronic diarrhea with more incontinence issues. Has tried pelvic floor therapy in the past without success.Order blood tests to check kidney and liver function and blood counts.

## 2024-06-29 NOTE — Progress Notes (Signed)
 Subjective:   Patient ID: Angela Barber, female    DOB: [birth year], 88 y.o.   MRN: [id number]  Discussed the use of AI scribe software for clinical note transcription with the patient, who gave verbal consent to proceed. History of Present Illness Angela Barber is an 88 year old female who presents with chronic diarrhea and regurgitation.  She has experienced chronic diarrhea for years, with worsening symptoms over time. Significant cramping accompanies bowel movements, causing considerable pain. No recent blood in the stool is noted.  She experiences episodes of regurgitation, where undigested food returns. This has become more consistent over the past couple of months, occurring unexpectedly and not immediately after meals. No specific dietary triggers have been identified.  She experiences coughing when eating, which sometimes prevents swallowing. This occurs mostly during meals but can also happen at night. No heartburn, acid reflux, or a chronic cough unrelated to eating is reported.  She has been monitoring her diet, reducing carbohydrate intake, although she finds simple carbs easier to digest when unwell. No recent fevers, chills, or new breathing problems are reported.  She has a history of seeing a gastroenterologist a few years ago, but her symptoms have changed and worsened since then. She also mentions a previous unsuccessful therapy related to a bladder issue.  Review of Systems  Constitutional: Negative.   HENT:  Positive for hearing loss.   Eyes: Negative.   Respiratory:  Negative for cough, chest tightness and shortness of breath.   Cardiovascular:  Negative for chest pain, palpitations and leg swelling.  Gastrointestinal:  Positive for abdominal pain, diarrhea and vomiting. Negative for abdominal distention, constipation and nausea.  Genitourinary:        Urinary incontinence  Musculoskeletal:  Positive for arthralgias.  Skin: Negative.   Neurological: Negative.    Psychiatric/Behavioral: Negative.      Objective:  Physical Exam Constitutional:      Appearance: She is well-developed.  HENT:     Head: Normocephalic and atraumatic.  Cardiovascular:     Rate and Rhythm: Normal rate and regular rhythm.  Pulmonary:     Effort: Pulmonary effort is normal. No respiratory distress.     Breath sounds: Normal breath sounds. No wheezing or rales.  Abdominal:     General: Bowel sounds are normal. There is no distension.     Palpations: Abdomen is soft.     Tenderness: There is no abdominal tenderness.  Musculoskeletal:     Cervical back: Normal range of motion.  Skin:    General: Skin is warm and dry.  Neurological:     Mental Status: She is alert and oriented to person, place, and time.     Coordination: Coordination normal.     Vitals:   06/29/24 0911  BP: 120/80  Pulse: 87  Temp: 98.1 F (36.7 C)  TempSrc: Oral  SpO2: 97%  Weight: 151 lb (68.5 kg)  Height: 5' 2.5 (1.588 m)    Assessment and Plan Assessment & Plan Chronic diarrhea with abdominal cramping and regurgitation Previous GI consultation was unsuccessful. Refer to gastroenterology for further evaluation and management. Order blood tests to check kidney and liver function and blood counts. Continue cholestyramine  in the meantime. She has tried pelvic floor therapy which was not tolerated well and not successful.   Hyperlipidemia Ordered lipid panel as due and adjust as needed  Bladder dysfunction Bladder dysfunction persists despite previous therapy.  Regurgitation of food New in the last few months and could be uncontrolled GERD.  Refer to gI for assessment. She does not have any symptoms of food getting stuck.

## 2024-07-04 ENCOUNTER — Ambulatory Visit: Payer: Self-pay | Admitting: Internal Medicine

## 2024-07-26 DIAGNOSIS — L820 Inflamed seborrheic keratosis: Secondary | ICD-10-CM | POA: Diagnosis not present

## 2024-10-03 ENCOUNTER — Encounter: Payer: Self-pay | Admitting: *Deleted

## 2024-11-29 ENCOUNTER — Ambulatory Visit: Admitting: Gastroenterology

## 2025-06-26 ENCOUNTER — Ambulatory Visit
